# Patient Record
Sex: Female | Born: 1940 | Race: White | Hispanic: No | Smoking: Never smoker
Health system: Southern US, Community
[De-identification: ages and names within clinical notes are randomized; demographics above are authoritative.]

## PROBLEM LIST (undated history)

## (undated) DIAGNOSIS — E119 Type 2 diabetes mellitus without complications: Secondary | ICD-10-CM

## (undated) DIAGNOSIS — J45909 Unspecified asthma, uncomplicated: Secondary | ICD-10-CM

## (undated) DIAGNOSIS — E7800 Pure hypercholesterolemia, unspecified: Secondary | ICD-10-CM

## (undated) DIAGNOSIS — I10 Essential (primary) hypertension: Secondary | ICD-10-CM

## (undated) HISTORY — PX: OTHER SURGICAL HISTORY: SHX169

---

## 2004-04-07 ENCOUNTER — Inpatient Hospital Stay: Payer: Self-pay

## 2004-04-07 ENCOUNTER — Other Ambulatory Visit: Payer: Self-pay

## 2005-04-21 ENCOUNTER — Ambulatory Visit: Payer: Self-pay | Admitting: Family Medicine

## 2006-12-08 ENCOUNTER — Emergency Department: Payer: Self-pay | Admitting: Internal Medicine

## 2008-12-03 ENCOUNTER — Ambulatory Visit: Payer: Self-pay | Admitting: Internal Medicine

## 2009-07-25 ENCOUNTER — Ambulatory Visit: Payer: Self-pay | Admitting: Internal Medicine

## 2009-07-25 ENCOUNTER — Inpatient Hospital Stay: Payer: Self-pay | Admitting: Internal Medicine

## 2009-08-13 ENCOUNTER — Inpatient Hospital Stay: Payer: Self-pay | Admitting: Internal Medicine

## 2010-03-09 ENCOUNTER — Ambulatory Visit: Payer: Self-pay | Admitting: Orthopedic Surgery

## 2010-03-12 ENCOUNTER — Inpatient Hospital Stay: Payer: Self-pay | Admitting: Orthopedic Surgery

## 2010-03-17 LAB — PATHOLOGY REPORT

## 2010-03-24 ENCOUNTER — Ambulatory Visit: Payer: Self-pay | Admitting: Internal Medicine

## 2010-03-24 DIAGNOSIS — J449 Chronic obstructive pulmonary disease, unspecified: Secondary | ICD-10-CM

## 2010-03-24 DIAGNOSIS — I10 Essential (primary) hypertension: Secondary | ICD-10-CM

## 2010-03-24 DIAGNOSIS — E119 Type 2 diabetes mellitus without complications: Secondary | ICD-10-CM

## 2010-03-24 DIAGNOSIS — M130 Polyarthritis, unspecified: Secondary | ICD-10-CM

## 2010-04-15 DIAGNOSIS — L8990 Pressure ulcer of unspecified site, unspecified stage: Secondary | ICD-10-CM

## 2010-04-27 DIAGNOSIS — E119 Type 2 diabetes mellitus without complications: Secondary | ICD-10-CM

## 2010-04-27 DIAGNOSIS — M358 Other specified systemic involvement of connective tissue: Secondary | ICD-10-CM

## 2010-04-27 DIAGNOSIS — I10 Essential (primary) hypertension: Secondary | ICD-10-CM

## 2010-04-27 DIAGNOSIS — M3589 Other specified systemic involvement of connective tissue: Secondary | ICD-10-CM

## 2010-05-04 DIAGNOSIS — L8990 Pressure ulcer of unspecified site, unspecified stage: Secondary | ICD-10-CM

## 2010-05-07 DIAGNOSIS — L8990 Pressure ulcer of unspecified site, unspecified stage: Secondary | ICD-10-CM

## 2010-05-13 ENCOUNTER — Encounter: Payer: Self-pay | Admitting: Nurse Practitioner

## 2010-05-13 ENCOUNTER — Encounter: Payer: Self-pay | Admitting: Cardiothoracic Surgery

## 2010-05-28 ENCOUNTER — Inpatient Hospital Stay: Payer: Self-pay | Admitting: Surgery

## 2010-06-09 ENCOUNTER — Encounter: Payer: Self-pay | Admitting: Cardiothoracic Surgery

## 2010-06-09 ENCOUNTER — Encounter: Payer: Self-pay | Admitting: Nurse Practitioner

## 2010-07-10 ENCOUNTER — Encounter: Payer: Self-pay | Admitting: Cardiothoracic Surgery

## 2010-07-10 ENCOUNTER — Encounter: Payer: Self-pay | Admitting: Nurse Practitioner

## 2010-08-04 ENCOUNTER — Encounter: Payer: Self-pay | Admitting: Physician Assistant

## 2010-08-09 ENCOUNTER — Encounter: Payer: Self-pay | Admitting: Physician Assistant

## 2010-08-25 ENCOUNTER — Ambulatory Visit: Payer: Self-pay | Admitting: Internal Medicine

## 2010-09-09 ENCOUNTER — Encounter: Payer: Self-pay | Admitting: Physician Assistant

## 2010-10-10 ENCOUNTER — Encounter: Payer: Self-pay | Admitting: Physician Assistant

## 2010-11-09 ENCOUNTER — Encounter: Payer: Self-pay | Admitting: Physician Assistant

## 2011-05-18 ENCOUNTER — Encounter: Payer: Self-pay | Admitting: Cardiothoracic Surgery

## 2011-05-18 ENCOUNTER — Encounter: Payer: Self-pay | Admitting: Nurse Practitioner

## 2011-06-09 ENCOUNTER — Encounter: Payer: Self-pay | Admitting: Nurse Practitioner

## 2011-06-09 ENCOUNTER — Encounter: Payer: Self-pay | Admitting: Cardiothoracic Surgery

## 2015-07-14 ENCOUNTER — Emergency Department: Payer: Medicare Other

## 2015-07-14 ENCOUNTER — Encounter: Payer: Self-pay | Admitting: Emergency Medicine

## 2015-07-14 ENCOUNTER — Inpatient Hospital Stay
Admission: EM | Admit: 2015-07-14 | Discharge: 2015-07-18 | DRG: 190 | Disposition: A | Discharge status: Skilled Nursing Facility | Payer: Medicare Other | Attending: Internal Medicine | Admitting: Internal Medicine

## 2015-07-14 DIAGNOSIS — N179 Acute kidney failure, unspecified: Secondary | ICD-10-CM | POA: Diagnosis present

## 2015-07-14 DIAGNOSIS — E1122 Type 2 diabetes mellitus with diabetic chronic kidney disease: Secondary | ICD-10-CM | POA: Diagnosis present

## 2015-07-14 DIAGNOSIS — R1319 Other dysphagia: Secondary | ICD-10-CM | POA: Diagnosis present

## 2015-07-14 DIAGNOSIS — J189 Pneumonia, unspecified organism: Secondary | ICD-10-CM | POA: Diagnosis present

## 2015-07-14 DIAGNOSIS — D638 Anemia in other chronic diseases classified elsewhere: Secondary | ICD-10-CM | POA: Diagnosis present

## 2015-07-14 DIAGNOSIS — E7800 Pure hypercholesterolemia, unspecified: Secondary | ICD-10-CM | POA: Diagnosis present

## 2015-07-14 DIAGNOSIS — L8990 Pressure ulcer of unspecified site, unspecified stage: Secondary | ICD-10-CM | POA: Diagnosis present

## 2015-07-14 DIAGNOSIS — Z96641 Presence of right artificial hip joint: Secondary | ICD-10-CM | POA: Diagnosis present

## 2015-07-14 DIAGNOSIS — J9621 Acute and chronic respiratory failure with hypoxia: Secondary | ICD-10-CM

## 2015-07-14 DIAGNOSIS — N183 Chronic kidney disease, stage 3 (moderate): Secondary | ICD-10-CM | POA: Diagnosis present

## 2015-07-14 DIAGNOSIS — R0603 Acute respiratory distress: Secondary | ICD-10-CM

## 2015-07-14 DIAGNOSIS — J9691 Respiratory failure, unspecified with hypoxia: Secondary | ICD-10-CM | POA: Diagnosis present

## 2015-07-14 DIAGNOSIS — J440 Chronic obstructive pulmonary disease with acute lower respiratory infection: Principal | ICD-10-CM | POA: Diagnosis present

## 2015-07-14 DIAGNOSIS — J441 Chronic obstructive pulmonary disease with (acute) exacerbation: Secondary | ICD-10-CM | POA: Diagnosis not present

## 2015-07-14 DIAGNOSIS — Z79899 Other long term (current) drug therapy: Secondary | ICD-10-CM

## 2015-07-14 DIAGNOSIS — J90 Pleural effusion, not elsewhere classified: Secondary | ICD-10-CM | POA: Diagnosis present

## 2015-07-14 DIAGNOSIS — E1110 Type 2 diabetes mellitus with ketoacidosis without coma: Secondary | ICD-10-CM

## 2015-07-14 DIAGNOSIS — J4520 Mild intermittent asthma, uncomplicated: Secondary | ICD-10-CM | POA: Diagnosis present

## 2015-07-14 DIAGNOSIS — Z7982 Long term (current) use of aspirin: Secondary | ICD-10-CM | POA: Diagnosis not present

## 2015-07-14 DIAGNOSIS — E785 Hyperlipidemia, unspecified: Secondary | ICD-10-CM | POA: Diagnosis present

## 2015-07-14 DIAGNOSIS — Z823 Family history of stroke: Secondary | ICD-10-CM

## 2015-07-14 DIAGNOSIS — Z7951 Long term (current) use of inhaled steroids: Secondary | ICD-10-CM

## 2015-07-14 DIAGNOSIS — Z825 Family history of asthma and other chronic lower respiratory diseases: Secondary | ICD-10-CM | POA: Diagnosis not present

## 2015-07-14 DIAGNOSIS — I129 Hypertensive chronic kidney disease with stage 1 through stage 4 chronic kidney disease, or unspecified chronic kidney disease: Secondary | ICD-10-CM | POA: Diagnosis present

## 2015-07-14 DIAGNOSIS — Z7984 Long term (current) use of oral hypoglycemic drugs: Secondary | ICD-10-CM

## 2015-07-14 DIAGNOSIS — J4530 Mild persistent asthma, uncomplicated: Secondary | ICD-10-CM | POA: Diagnosis not present

## 2015-07-14 DIAGNOSIS — R05 Cough: Secondary | ICD-10-CM | POA: Diagnosis not present

## 2015-07-14 DIAGNOSIS — J9601 Acute respiratory failure with hypoxia: Secondary | ICD-10-CM | POA: Diagnosis present

## 2015-07-14 DIAGNOSIS — J4521 Mild intermittent asthma with (acute) exacerbation: Secondary | ICD-10-CM

## 2015-07-14 DIAGNOSIS — R0902 Hypoxemia: Secondary | ICD-10-CM

## 2015-07-14 DIAGNOSIS — R0600 Dyspnea, unspecified: Secondary | ICD-10-CM | POA: Diagnosis not present

## 2015-07-14 HISTORY — DX: Essential (primary) hypertension: I10

## 2015-07-14 HISTORY — DX: Type 2 diabetes mellitus without complications: E11.9

## 2015-07-14 HISTORY — DX: Pure hypercholesterolemia, unspecified: E78.00

## 2015-07-14 HISTORY — DX: Unspecified asthma, uncomplicated: J45.909

## 2015-07-14 LAB — COMPREHENSIVE METABOLIC PANEL
ALBUMIN: 3 g/dL — AB (ref 3.5–5.0)
ALK PHOS: 123 U/L (ref 38–126)
ALT: 17 U/L (ref 14–54)
ANION GAP: 10 (ref 5–15)
AST: 15 U/L (ref 15–41)
BILIRUBIN TOTAL: 0.6 mg/dL (ref 0.3–1.2)
BUN: 24 mg/dL — ABNORMAL HIGH (ref 6–20)
CALCIUM: 8.8 mg/dL — AB (ref 8.9–10.3)
CO2: 24 mmol/L (ref 22–32)
CREATININE: 1.38 mg/dL — AB (ref 0.44–1.00)
Chloride: 102 mmol/L (ref 101–111)
GFR calc non Af Amer: 36 mL/min — ABNORMAL LOW (ref >60)
GFR, EST AFRICAN AMERICAN: 42 mL/min — AB (ref >60)
GLUCOSE: 242 mg/dL — AB (ref 65–99)
Potassium: 4.1 mmol/L (ref 3.5–5.1)
Sodium: 136 mmol/L (ref 135–145)
TOTAL PROTEIN: 7.1 g/dL (ref 6.5–8.1)

## 2015-07-14 LAB — CBC WITH DIFFERENTIAL/PLATELET
Basophils Absolute: 0.1 10*3/uL (ref 0–0.1)
Basophils Relative: 1 %
Eosinophils Absolute: 0 10*3/uL (ref 0–0.7)
Eosinophils Relative: 0 %
HEMATOCRIT: 32.2 % — AB (ref 35.0–47.0)
HEMOGLOBIN: 10.6 g/dL — AB (ref 12.0–16.0)
LYMPHS ABS: 1.3 10*3/uL (ref 1.0–3.6)
Lymphocytes Relative: 10 %
MCH: 29.3 pg (ref 26.0–34.0)
MCHC: 32.9 g/dL (ref 32.0–36.0)
MCV: 89.1 fL (ref 80.0–100.0)
Monocytes Absolute: 1 10*3/uL — ABNORMAL HIGH (ref 0.2–0.9)
NEUTROS ABS: 11.4 10*3/uL — AB (ref 1.4–6.5)
Platelets: 361 10*3/uL (ref 150–440)
RBC: 3.62 MIL/uL — AB (ref 3.80–5.20)
RDW: 15 % — ABNORMAL HIGH (ref 11.5–14.5)
WBC: 13.9 10*3/uL — AB (ref 3.6–11.0)

## 2015-07-14 LAB — GLUCOSE, CAPILLARY
Glucose-Capillary: 271 mg/dL — ABNORMAL HIGH (ref 65–99)
Glucose-Capillary: 298 mg/dL — ABNORMAL HIGH (ref 65–99)
Glucose-Capillary: 371 mg/dL — ABNORMAL HIGH (ref 65–99)
Glucose-Capillary: 337 mg/dL — ABNORMAL HIGH (ref 65–99)
Glucose-Capillary: 362 mg/dL — ABNORMAL HIGH (ref 65–99)

## 2015-07-14 LAB — BRAIN NATRIURETIC PEPTIDE: B Natriuretic Peptide: 63 pg/mL (ref 0.0–100.0)

## 2015-07-14 LAB — TSH: TSH: 0.865 u[IU]/mL (ref 0.350–4.500)

## 2015-07-14 LAB — TROPONIN I: Troponin I: <0.03 ng/mL (ref <0.031)

## 2015-07-14 LAB — MRSA PCR SCREENING: MRSA BY PCR: NEGATIVE

## 2015-07-14 LAB — HEMOGLOBIN A1C: Hgb A1c MFr Bld: 7.8 % — ABNORMAL HIGH (ref 4.0–6.0)

## 2015-07-14 MED ORDER — DOCUSATE SODIUM 100 MG PO CAPS
100.0000 mg | ORAL_CAPSULE | Freq: Two times a day (BID) | ORAL | Status: DC
  Administered 2015-07-14 – 2015-07-18 (×8): 100 mg via ORAL
  Filled 2015-07-14 (×9): qty 1

## 2015-07-14 MED ORDER — BENAZEPRIL HCL 20 MG PO TABS
20.0000 mg | ORAL_TABLET | Freq: Every day | ORAL | Status: DC
  Administered 2015-07-14 – 2015-07-17 (×4): 20 mg via ORAL
  Filled 2015-07-14 (×4): qty 1

## 2015-07-14 MED ORDER — VITAMIN B-12 1000 MCG PO TABS
1000.0000 ug | ORAL_TABLET | Freq: Every day | ORAL | Status: DC
  Administered 2015-07-14 – 2015-07-18 (×5): 1000 ug via ORAL
  Filled 2015-07-14 (×6): qty 1

## 2015-07-14 MED ORDER — SODIUM CHLORIDE 0.9 % IV SOLN
INTRAVENOUS | Status: DC
  Administered 2015-07-14: 07:00:00 via INTRAVENOUS

## 2015-07-14 MED ORDER — SODIUM CHLORIDE 0.9 % IV BOLUS (SEPSIS)
1000.0000 mL | Freq: Once | INTRAVENOUS | Status: AC
  Administered 2015-07-14: 1000 mL via INTRAVENOUS

## 2015-07-14 MED ORDER — SIMVASTATIN 20 MG PO TABS
20.0000 mg | ORAL_TABLET | Freq: Every day | ORAL | Status: DC
  Administered 2015-07-15 – 2015-07-17 (×3): 20 mg via ORAL
  Filled 2015-07-14 (×4): qty 1

## 2015-07-14 MED ORDER — AMLODIPINE BESYLATE 10 MG PO TABS
10.0000 mg | ORAL_TABLET | Freq: Every day | ORAL | Status: DC
  Administered 2015-07-14 – 2015-07-17 (×4): 10 mg via ORAL
  Filled 2015-07-14 (×4): qty 1

## 2015-07-14 MED ORDER — LEVOFLOXACIN IN D5W 750 MG/150ML IV SOLN
750.0000 mg | INTRAVENOUS | Status: DC
  Filled 2015-07-14: qty 150

## 2015-07-14 MED ORDER — CALCIUM CARBONATE-VITAMIN D 500-200 MG-UNIT PO TABS
1.0000 | ORAL_TABLET | Freq: Every day | ORAL | Status: DC
  Administered 2015-07-15 – 2015-07-17 (×3): 1 via ORAL
  Filled 2015-07-14 (×4): qty 1

## 2015-07-14 MED ORDER — METHYLPREDNISOLONE SODIUM SUCC 125 MG IJ SOLR
125.0000 mg | Freq: Once | INTRAMUSCULAR | Status: AC
  Administered 2015-07-14: 125 mg via INTRAVENOUS
  Filled 2015-07-14: qty 2

## 2015-07-14 MED ORDER — HYDROCHLOROTHIAZIDE 12.5 MG PO CAPS
12.5000 mg | ORAL_CAPSULE | Freq: Every day | ORAL | Status: DC
  Administered 2015-07-14 – 2015-07-17 (×4): 12.5 mg via ORAL
  Filled 2015-07-14 (×4): qty 1

## 2015-07-14 MED ORDER — ONDANSETRON HCL 4 MG PO TABS: 4.0000 mg | ORAL_TABLET | Freq: Four times a day (QID) | ORAL | Status: DC | PRN

## 2015-07-14 MED ORDER — INSULIN ASPART 100 UNIT/ML ~~LOC~~ SOLN
0.0000 [IU] | Freq: Three times a day (TID) | SUBCUTANEOUS | Status: DC
  Administered 2015-07-14 (×2): 7 [IU] via SUBCUTANEOUS
  Administered 2015-07-14: 5 [IU] via SUBCUTANEOUS
  Filled 2015-07-14: qty 8
  Filled 2015-07-14: qty 7
  Filled 2015-07-14: qty 5

## 2015-07-14 MED ORDER — HEPARIN SODIUM (PORCINE) 5000 UNIT/ML IJ SOLN
5000.0000 [IU] | Freq: Three times a day (TID) | INTRAMUSCULAR | Status: DC
  Administered 2015-07-14 – 2015-07-16 (×7): 5000 [IU] via SUBCUTANEOUS
  Filled 2015-07-14 (×7): qty 1

## 2015-07-14 MED ORDER — ACETAMINOPHEN 325 MG PO TABS: 650.0000 mg | ORAL_TABLET | Freq: Four times a day (QID) | ORAL | Status: DC | PRN

## 2015-07-14 MED ORDER — PREDNISONE 20 MG PO TABS
30.0000 mg | ORAL_TABLET | Freq: Every day | ORAL | Status: AC
  Filled 2015-07-14 (×2): qty 1

## 2015-07-14 MED ORDER — LEVOFLOXACIN IN D5W 750 MG/150ML IV SOLN
750.0000 mg | Freq: Once | INTRAVENOUS | Status: AC
  Administered 2015-07-14: 750 mg via INTRAVENOUS
  Filled 2015-07-14: qty 150

## 2015-07-14 MED ORDER — PREDNISONE 20 MG PO TABS
40.0000 mg | ORAL_TABLET | Freq: Every day | ORAL | Status: AC
  Administered 2015-07-15: 40 mg via ORAL
  Filled 2015-07-14: qty 2

## 2015-07-14 MED ORDER — PROMETHAZINE HCL 25 MG/ML IJ SOLN
12.5000 mg | Freq: Four times a day (QID) | INTRAMUSCULAR | Status: DC | PRN
  Administered 2015-07-14: 12.5 mg via INTRAVENOUS
  Filled 2015-07-14 (×2): qty 1

## 2015-07-14 MED ORDER — ADULT MULTIVITAMIN W/MINERALS CH
1.0000 | ORAL_TABLET | Freq: Every day | ORAL | Status: DC
  Administered 2015-07-14 – 2015-07-18 (×5): 1 via ORAL
  Filled 2015-07-14 (×6): qty 1

## 2015-07-14 MED ORDER — INSULIN ASPART 100 UNIT/ML ~~LOC~~ SOLN
0.0000 [IU] | Freq: Every day | SUBCUTANEOUS | Status: DC
  Administered 2015-07-14: 5 [IU] via SUBCUTANEOUS
  Filled 2015-07-14: qty 5

## 2015-07-14 MED ORDER — PREDNISONE 10 MG PO TABS
10.0000 mg | ORAL_TABLET | Freq: Every day | ORAL | Status: DC
  Administered 2015-07-16: 10 mg via ORAL
  Administered 2015-07-17: 20 mg via ORAL
  Administered 2015-07-18: 10 mg via ORAL
  Filled 2015-07-14: qty 1

## 2015-07-14 MED ORDER — PREDNISONE 20 MG PO TABS
20.0000 mg | ORAL_TABLET | Freq: Every day | ORAL | Status: AC
  Administered 2015-07-16: 20 mg via ORAL

## 2015-07-14 MED ORDER — MORPHINE SULFATE (PF) 2 MG/ML IV SOLN: 1.0000 mg | INTRAVENOUS | Status: DC | PRN

## 2015-07-14 MED ORDER — IPRATROPIUM-ALBUTEROL 0.5-2.5 (3) MG/3ML IN SOLN
3.0000 mL | RESPIRATORY_TRACT | Status: DC
  Administered 2015-07-14 – 2015-07-17 (×20): 3 mL via RESPIRATORY_TRACT
  Filled 2015-07-14 (×21): qty 3

## 2015-07-14 MED ORDER — IPRATROPIUM-ALBUTEROL 0.5-2.5 (3) MG/3ML IN SOLN
3.0000 mL | Freq: Once | RESPIRATORY_TRACT | Status: AC
  Administered 2015-07-14: 3 mL via RESPIRATORY_TRACT
  Filled 2015-07-14: qty 3

## 2015-07-14 MED ORDER — MOMETASONE FURO-FORMOTEROL FUM 200-5 MCG/ACT IN AERO
2.0000 | INHALATION_SPRAY | Freq: Two times a day (BID) | RESPIRATORY_TRACT | Status: DC
  Administered 2015-07-14 – 2015-07-16 (×5): 2 via RESPIRATORY_TRACT
  Filled 2015-07-14: qty 8.8

## 2015-07-14 MED ORDER — SODIUM CHLORIDE 0.9% FLUSH
3.0000 mL | Freq: Two times a day (BID) | INTRAVENOUS | Status: DC
  Administered 2015-07-14 – 2015-07-18 (×9): 3 mL via INTRAVENOUS

## 2015-07-14 MED ORDER — DEXTROSE 5 % IV SOLN: 500.0000 mg | Freq: Once | INTRAVENOUS | Status: DC

## 2015-07-14 MED ORDER — ASPIRIN EC 81 MG PO TBEC
81.0000 mg | DELAYED_RELEASE_TABLET | Freq: Every day | ORAL | Status: DC
Start: 2015-07-14 — End: 2015-07-18
  Administered 2015-07-14 – 2015-07-18 (×5): 81 mg via ORAL
  Filled 2015-07-14 (×5): qty 1

## 2015-07-14 MED ORDER — ONDANSETRON HCL 4 MG/2ML IJ SOLN
4.0000 mg | Freq: Four times a day (QID) | INTRAMUSCULAR | Status: DC | PRN
  Administered 2015-07-14 – 2015-07-15 (×3): 4 mg via INTRAVENOUS
  Filled 2015-07-14 (×3): qty 2

## 2015-07-14 MED ORDER — GUAIFENESIN-CODEINE 100-10 MG/5ML PO SOLN: 5.0000 mL | Freq: Every evening | ORAL | Status: DC | PRN

## 2015-07-14 MED ORDER — PREDNISONE 20 MG PO TABS
50.0000 mg | ORAL_TABLET | Freq: Every day | ORAL | Status: AC
  Administered 2015-07-14: 50 mg via ORAL
  Filled 2015-07-14: qty 2

## 2015-07-14 MED ORDER — ALBUTEROL SULFATE HFA 108 (90 BASE) MCG/ACT IN AERS: 2.0000 | INHALATION_SPRAY | Freq: Four times a day (QID) | RESPIRATORY_TRACT | Status: DC | PRN

## 2015-07-14 MED ORDER — ACETAMINOPHEN 650 MG RE SUPP: 650.0000 mg | Freq: Four times a day (QID) | RECTAL | Status: DC | PRN

## 2015-07-14 MED ORDER — DEXTROSE 5 % IV SOLN: 1.0000 g | INTRAVENOUS | Status: DC

## 2015-07-14 NOTE — ED Notes (Signed)
Pt reports improved breathing. resp rate improving, pt no longer markedly dyspnic with exertion.

## 2015-07-14 NOTE — Consult Note (Signed)
PULMONARY / CRITICAL CARE MEDICINE   Name: Brandi Hoover MRN: [id number] DOB: 01/02/1941    ADMISSION DATE:  07/14/2015 CONSULTATION DATE: 07/14/15  REFERRING MD: Boykin Reaper  CHIEF COMPLAINT:  Shortness of breath.  HISTORY OF PRESENT ILLNESS:   Brandi, Hoover is a 75 year old female with past medical history significant history of Diabetese melitus, Hypertension, Hyperlipidemia ,COPD. Patient presented to EMS on 07/14/15 via EMS with severe shortness of breath, she received some treatments with bronchodilators, but continued to be hypoxic.  She received some intravenous steroids and levofloxacin.  Patient was started on a non -rebreather mask.  CXR was concerning for  Small right pleural effusion versus infiltrate. PCCM team was consulted on 6/5 for further management.  PAST MEDICAL HISTORY :  She  has a past medical history of Diabetes mellitus without complication ([place]); Hypertension; High cholesterol; and Asthma.  PAST SURGICAL HISTORY: She  has past surgical history that includes right hip replacement.  Allergies  Allergen Reactions  . Azithromycin Itching  . Gabapentin Other (See Comments)    Bad dreams, insomnia  . Omeprazole Other (See Comments)    Reaction: unknown  . Ramipril Other (See Comments)    Reaction: unknown    No current facility-administered medications on file prior to encounter.   No current outpatient prescriptions on file prior to encounter.    FAMILY HISTORY:  Her has no family status information on file.   SOCIAL HISTORY: She  reports that she has never smoked. She has never used smokeless tobacco. She reports that she does not drink alcohol or use illicit drugs.  REVIEW OF SYSTEMS:   Review of Systems  Constitutional: Negative for fever and chills.  HENT: Negative for ear discharge, ear pain, hearing loss and tinnitus.   Eyes: Negative for blurred vision, double vision, photophobia, pain, discharge and redness.  Respiratory: Positive for cough, sputum  production, shortness of breath and wheezing. Negative for hemoptysis.   Cardiovascular: Negative for palpitations and orthopnea.  Gastrointestinal: Negative for nausea and vomiting.  Genitourinary: Negative for urgency and frequency.  Musculoskeletal: Negative for back pain and neck pain.  Neurological: Negative for speech change, focal weakness and headaches.  Psychiatric/Behavioral: Negative for hallucinations. The patient is not nervous/anxious.      SUBJECTIVE:   Patient states that the patient came with shortness of breath, was on BiPAP and now weaned to nasal canula, patient states that she has been feeling better, but with activities she will get short of breath.  VITAL SIGNS: BP 143/67 mmHg  Pulse 90  Temp(Src) 98.3 F (36.8 C) (Oral)  Resp 20  Ht 5\' 6"  (1.676 m)  Wt 54.885 kg (121 lb)  BMI 19.54 kg/m2  SpO2 94%  HEMODYNAMICS:    VENTILATOR SETTINGS:    INTAKE / OUTPUT: I/O last 3 completed shifts: In: -  Out: 80 [Urine:80]  PHYSICAL EXAMINATION: General:  Elderly, white female, in no acute distress. Neuro:  Awake, alert, oriented, follows command, no focal deficits HEENT:  Atraumatic, normocephalic, no discharge Cardiovascular:  S1S2, rrr, no MRG noted Lungs:  Diminished bilaterally, no wheezes, crackles, rhonchi noted. Abdomen: obese, soft, non tender Musculoskeletal:  RLE trace edema, no inflammation/deformity noted Skin: Grossly intact  LABS:  BMET  Recent Labs Lab 07/14/15 0057  NA 136  K 4.1  CL 102  CO2 24  BUN 24*  CREATININE 1.38*  GLUCOSE 242*    Electrolytes  Recent Labs Lab 07/14/15 0057  CALCIUM 8.8*    CBC  Recent  Labs Lab 07/14/15 0057  WBC 13.9*  HGB 10.6*  HCT 32.2*  PLT 361    Coag's No results for input(s): APTT, INR in the last 168 hours.  Sepsis Markers No results for input(s): LATICACIDVEN, PROCALCITON, O2SATVEN in the last 168 hours.  ABG No results for input(s): PHART, PCO2ART, PO2ART in the last  168 hours.  Liver Enzymes  Recent Labs Lab 07/14/15 0057  AST 15  ALT 17  ALKPHOS 123  BILITOT 0.6  ALBUMIN 3.0*    Cardiac Enzymes  Recent Labs Lab 07/14/15 0057  TROPONINI <0.03    Glucose  Recent Labs Lab 07/14/15 0424 07/14/15 0727  GLUCAP 271* 298*    Imaging Dg Chest Port 1 View  07/14/2015  CLINICAL DATA:  75 year old female with cough and congestion and shortness of breath EXAM: PORTABLE CHEST 1 VIEW COMPARISON:  None. FINDINGS: Single-view of the chest demonstrates a small right pleural effusion with right lung base atelectasis versus infiltrate. The left lung is clear. There is no pneumothorax. Top-normal cardiac silhouette. Osteopenia with degenerative changes of the spine and shoulders. No acute fracture. IMPRESSION: Small right pleural effusion and right lung base atelectasis versus infiltrate. Clinical correlation and follow-up recommended. Electronically Signed   By: Anner Crete M.D.   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 -Pleural effusion is small and does not require any intervention at this time will watch CARDIOVASCULAR A:  History of  hypertension History of hyperlipidemia P:  -Continuous telemetry  -Continue amlodipine -Continue statin therapy  RENAL A:   Acute kidney injury P:   -Monitor intake output   -follow-up chemistry -Replace electrolytes per ICU protocol  GASTROINTESTINAL A:   No active issues P  Heart healthy diet with modified carb  HEMATOLOGICAL A:   No active issues P:   Transfuse if Hgb <7   heparin for DVT prophylaxis  INFECTIOUS A:   Leukocytosis possibly due to steroids P:   Follow culture Monitor fever curve Continue antibiotics  ENDOCRINE A:    Diabetes mellitus P:   -Follow blood sugars before meals and bedtime's  -Sliding scale insulin coverage   NEUROLOGIC A:   No active issues P:   RASS goal:0     Lashunta Frieden,AG-ACNP Pulmonary & [place]  07/14/2015, 10:17 AM

## 2015-07-14 NOTE — Progress Notes (Signed)
[place] [facility] [place] NAME: Keyla Goodlin    MR#:  [id number]  DATE OF BIRTH:  [birth year]  SUBJECTIVE:  CHIEF COMPLAINT:   Chief Complaint  Patient presents with  . Respiratory Distress   Continues to have shortness of breath and weakness. Does not wear home oxygen. Never smoked. Good appetite. Was on a nonrebreather and now on 5 L oxygen.   REVIEW OF SYSTEMS:    Review of Systems  Constitutional: Positive for malaise/fatigue. Negative for fever and chills.  HENT: Negative for sore throat.   Eyes: Negative for blurred vision, double vision and pain.  Respiratory: Positive for cough, sputum production and shortness of breath. Negative for hemoptysis and wheezing.   Cardiovascular: Negative for chest pain, palpitations, orthopnea and leg swelling.  Gastrointestinal: Negative for heartburn, nausea, vomiting, abdominal pain, diarrhea and constipation.  Genitourinary: Negative for dysuria and hematuria.  Musculoskeletal: Positive for myalgias. Negative for back pain and joint pain.  Skin: Negative for rash.  Neurological: Positive for weakness. Negative for sensory change, speech change, focal weakness and headaches.  Endo/Heme/Allergies: Does not bruise/bleed easily.  Psychiatric/Behavioral: Negative for depression. The patient is not nervous/anxious.     DRUG ALLERGIES:   Allergies  Allergen Reactions  . Azithromycin Itching  . Gabapentin Other (See Comments)    Bad dreams, insomnia  . Omeprazole Other (See Comments)    Reaction: unknown  . Ramipril Other (See Comments)    Reaction: unknown    VITALS:  Blood pressure 147/127, pulse 99, temperature 98.7 F (37.1 C), temperature source Oral, resp. rate 29, height 5\' 6"  (1.676 m), weight 54.885 kg (121 lb), SpO2 95 %.  PHYSICAL EXAMINATION:   Physical Exam  GENERAL:  75 y.o.-year-old patient lying in the bed with no acute distress. Obese EYES: Pupils equal, round,  reactive to light and accommodation. No scleral icterus. Extraocular muscles intact.  HEENT: Head atraumatic, normocephalic. Oropharynx and nasopharynx clear.  NECK:  Supple, no jugular venous distention. No thyroid enlargement, no tenderness.  LUNGS: Increased work of breathing. Right lower lobe decreased air entry and rhonchi. CARDIOVASCULAR: S1, S2 normal. No murmurs, rubs, or gallops.  ABDOMEN: Soft, nontender, nondistended. Bowel sounds present. No organomegaly or mass.  EXTREMITIES: No cyanosis, clubbing or edema b/l.    NEUROLOGIC: Cranial nerves II through XII are intact. No focal Motor or sensory deficits b/l.   PSYCHIATRIC: The patient is alert and oriented x 3.  SKIN: No obvious rash, lesion, or ulcer.   LABORATORY PANEL:   CBC  Recent Labs Lab 07/14/15 0057  WBC 13.9*  HGB 10.6*  HCT 32.2*  PLT 361   ------------------------------------------------------------------------------------------------------------------ Chemistries   Recent Labs Lab 07/14/15 0057  NA 136  K 4.1  CL 102  CO2 24  GLUCOSE 242*  BUN 24*  CREATININE 1.38*  CALCIUM 8.8*  AST 15  ALT 17  ALKPHOS 123  BILITOT 0.6   ------------------------------------------------------------------------------------------------------------------  Cardiac Enzymes  Recent Labs Lab 07/14/15 0057  TROPONINI <0.03   ------------------------------------------------------------------------------------------------------------------  RADIOLOGY:  Dg Chest Port 1 View  07/14/2015  CLINICAL DATA:  75 year old female with cough and congestion and shortness of breath EXAM: PORTABLE CHEST 1 VIEW COMPARISON:  None. FINDINGS: Single-view of the chest demonstrates a small right pleural effusion with right lung base atelectasis versus infiltrate. The left lung is clear. There is no pneumothorax. Top-normal cardiac silhouette. Osteopenia with degenerative changes of the spine and shoulders. No acute fracture.  IMPRESSION: Small right pleural effusion  and right lung base atelectasis versus infiltrate. Clinical correlation and follow-up recommended. Electronically Signed   By: Anner Crete M.D.   On: 07/14/2015 01:32     ASSESSMENT AND PLAN:   * Acute right lower lobe pneumonia with acute hypoxic respiratory failure On IV antibiotics. Nebulizers when necessary. Wean oxygen as tolerated Sputum culture. Appreciate pulmonary input. Discussed with Dr. Stevenson Clinch.  * Hypertension Continue home medications.  * Diabetes mellitus Home medications plus sliding scale insulin.  * CKD 3  * Anemia of chronic disease  * DVT prophylaxis with subcutaneous heparin  All the records are reviewed and case discussed with Care Management/Social Workerr. Management plans discussed with the patient, family and they are in agreement.  CODE STATUS: FULL CODE  DVT Prophylaxis: SCDs  TOTAL TIME TAKING CARE OF THIS PATIENT: 35 minutes.   POSSIBLE D/C IN 2-3 DAYS, DEPENDING ON CLINICAL CONDITION.  Hillary Bow R M.D on 07/14/2015 at 10:52 AM  Between 7am to 6pm - Pager - [phone]  After 6pm go to [email or web address] - password [email or web address] [facility]  [facility]  Office  [phone]  CC: Primary care physician; No primary care provider on file.  Note: This dictation was prepared with Dragon dictation along with smaller phrase technology. Any transcriptional errors that result from this process are unintentional.

## 2015-07-14 NOTE — Care Management Note (Signed)
Case Management Note  Patient Details  Name: ANALIESE KRUPKA MRN: [id number] Date of Birth: [birth year]  Subjective/Objective:                  Patient states She is dependent on a rollator for ambulation. She states she still drives. She has two brothers that are available and local to assist too. She uses a wheelchair for extended mobility such as shopping. Her PCP is Dr. Adrian Prows. O2 is acute. She uses [facility] for Rx.   Action/Plan: Met with patient to discuss discharge planning. Patient states she has a daughter but she lives in [place]. She states that her daughter is willing to assist her with groceries if needed. Per staff patient's daughter is not very healthy either.   List of home health agencies left with patient. Requested PT for patient.   Expected Discharge Date:                  Expected Discharge Plan:     In-House Referral:     Discharge planning Services  CM Consult  Post Acute Care Choice:  Home Health Choice offered to:  Patient  DME Arranged:    DME Agency:     HH Arranged:    [place] Agency:     Status of Service:  In process, will continue to follow  Medicare Important Message Given:    Date Medicare IM Given:    Medicare IM give by:    Date Additional Medicare IM Given:    Additional Medicare Important Message give by:     If discussed at [place] of Stay Meetings, dates discussed:    Additional Comments:  Marshell Garfinkel, RN 07/14/2015, 11:39 AM

## 2015-07-14 NOTE — Progress Notes (Signed)
[place] Progress Note Patient Name: LIV FOOS DOB: 03/21/1940 MRN: [id number]   Date of Service  07/14/2015  HPI/Events of Note  N/V - Zofran not effective.   eICU Interventions  Will order Phenergan 12.5 mg IV Q 6 hours PRN.     Intervention Category Intermediate Interventions: Other:  Lysle Dingwall 07/14/2015, 9:46 PM

## 2015-07-14 NOTE — ED Notes (Signed)
Pt states one week of cough and congestion. Pt with shob increasing last pm. Pt arrives on 15lpm non rebreather with pox of 90%. Pt received albuterol in ems. Rales auscultated. Pt with tachypnea and dyspnea with minimal exertion.

## 2015-07-14 NOTE — ED Provider Notes (Signed)
[facility] Emergency Department Provider Note   ____________________________________________  Time seen: Approximately 12:58 AM  I have reviewed the triage vital signs and the nursing notes.   HISTORY  Chief Complaint Respiratory Distress    HPI Brandi Hoover is a 75 y.o. female who presents to the ED from home via EMS with a chief complaint of shortness of breath. Patient has a history of asthma, does not wear oxygen at home. Complains of a one-week history of cough productive of gray sputum, congestion with increasing shortness of breath last p.m. Saw her PCP a few days ago who placed patient on Mucinex and pro-air. Patient arrives on 15 L nonrebreather with pulse ox 90%. Received albuterol nebulizer en route per EMS which partially helped her shortness of breath. Denies associated fever, chills, chest pain, abdominal pain, nausea, vomiting, diarrhea. Denies recent travel or trauma.   Past Medical History  Diagnosis Date  . Diabetes mellitus without complication ([place])   . Hypertension   . High cholesterol   . Asthma     There are no active problems to display for this patient.   Past Surgical History  Procedure Laterality Date  . Right hip replacement      Current Outpatient Rx  Name  Route  Sig  Dispense  Refill  . albuterol (VENTOLIN HFA) 108 (90 Base) MCG/ACT inhaler   Inhalation   Inhale 2 puffs into the lungs every 6 (six) hours as needed.         Marland Kitchen amLODipine (NORVASC) 10 MG tablet   Oral   Take 10 mg by mouth daily.         Marland Kitchen aspirin EC 81 MG tablet   Oral   Take 81 mg by mouth daily.         . benazepril (LOTENSIN) 20 MG tablet   Oral   Take 20 mg by mouth daily.         . Fluticasone-Salmeterol (ADVAIR) 250-50 MCG/DOSE AEPB   Inhalation   Inhale 1 puff into the lungs 2 (two) times daily.         Marland Kitchen glipiZIDE (GLUCOTROL) 10 MG tablet   Oral   Take 10 mg by mouth 2 (two) times daily before a meal.         .  hydrochlorothiazide (MICROZIDE) 12.5 MG capsule   Oral   Take 12.5 mg by mouth daily.         . metFORMIN (GLUCOPHAGE) 500 MG tablet   Oral   Take 1,000-1,500 mg by mouth 2 (two) times daily with a meal. 1500 mg in the morning and 1000 mg in the evening         . Multiple Vitamin (MULTIVITAMIN WITH MINERALS) TABS tablet   Oral   Take 1 tablet by mouth daily.         . simvastatin (ZOCOR) 20 MG tablet   Oral   Take 20 mg by mouth at bedtime.         . vitamin B-12 (CYANOCOBALAMIN) 1000 MCG tablet   Oral   Take 1,000 mcg by mouth daily.         . Calcium Carbonate-Vitamin D 600-400 MG-UNIT tablet   Oral   Take 1 tablet by mouth at bedtime.         Marland Kitchen guaiFENesin-codeine 100-10 MG/5ML syrup   Oral   Take 5 mLs by mouth at bedtime as needed.      0     Allergies  Review of patient's allergies indicates not on file.  No family history on file.  Social History Social History  Substance Use Topics  . Smoking status: Never Smoker   . Smokeless tobacco: Never Used  . Alcohol Use: No    Review of Systems  Constitutional: No fever/chills. Eyes: No visual changes. ENT: No sore throat. Cardiovascular: Denies chest pain. Respiratory: Positive for cough and shortness of breath. Gastrointestinal: No abdominal pain.  No nausea, no vomiting.  No diarrhea.  No constipation. Genitourinary: Negative for dysuria. Musculoskeletal: Negative for back pain. Skin: Negative for rash. Neurological: Negative for headaches, focal weakness or numbness.  10-point ROS otherwise negative.  ____________________________________________   PHYSICAL EXAM:  VITAL SIGNS: ED Triage Vitals  Enc Vitals Group     BP 07/14/15 0056 155/98 mmHg     Pulse Rate 07/14/15 0056 113     Resp 07/14/15 0056 30     Temp 07/14/15 0056 99 F (37.2 C)     Temp Source 07/14/15 0056 Oral     SpO2 07/14/15 0056 90 %     Weight 07/14/15 0056 121 lb (54.885 kg)     Height 07/14/15 0056 5\' 6"   (1.676 m)     Head Cir --      Peak Flow --      Pain Score --      Pain Loc --      Pain Edu? --      Excl. in [place]? --     Constitutional: Alert and oriented. Well appearing and in moderate acute distress. Eyes: Conjunctivae are normal. PERRL. EOMI. Head: Atraumatic. Nose: Congestion/rhinnorhea. Mouth/Throat: Mucous membranes are moist.  Oropharynx non-erythematous. Neck: No stridor.   Cardiovascular: Tachycardic rate, regular rhythm. Grossly normal heart sounds.  Good peripheral circulation. Respiratory: Increased respiratory effort.  No retractions. Lungs diminished bilaterally; Rales bibasilarly. Gastrointestinal: Obese. Soft and nontender. No distention. No abdominal bruits. No CVA tenderness. Musculoskeletal: No lower extremity tenderness. 2+ nonpitting BLE edema.  No joint effusions. Neurologic:  Normal speech and language. No gross focal neurologic deficits are appreciated.  Skin:  Skin is warm, dry and intact. No rash noted. Psychiatric: Mood and affect are normal. Speech and behavior are normal.  ____________________________________________   LABS (all labs ordered are listed, but only abnormal results are displayed)  Labs Reviewed  CBC WITH DIFFERENTIAL/PLATELET - Abnormal; Notable for the following:    WBC 13.9 (*)    RBC 3.62 (*)    Hemoglobin 10.6 (*)    HCT 32.2 (*)    RDW 15.0 (*)    Neutro Abs 11.4 (*)    Monocytes Absolute 1.0 (*)    All other components within normal limits  COMPREHENSIVE METABOLIC PANEL - Abnormal; Notable for the following:    Glucose, Bld 242 (*)    BUN 24 (*)    Creatinine, Ser 1.38 (*)    Calcium 8.8 (*)    Albumin 3.0 (*)    GFR calc non Af Amer 36 (*)    GFR calc Af Amer 42 (*)    All other components within normal limits  CULTURE, BLOOD (ROUTINE X 2)  CULTURE, BLOOD (ROUTINE X 2)  TROPONIN I  BRAIN NATRIURETIC PEPTIDE   ____________________________________________  EKG  ED ECG REPORT I, SUNG,JADE J, the attending  physician, personally viewed and interpreted this ECG.   Date: 07/14/2015  EKG Time: 0053  Rate: 105  Rhythm: atrial flutter, rate 105  Axis: LAD  Intervals:left bundle branch block  ST&T Change:  Nonspecific  ____________________________________________  RADIOLOGY  Portable chest x-ray (viewed by me, interpreted per Dr. Quintella Reichert): Small right pleural effusion and right lung base atelectasis versus infiltrate. Clinical correlation and follow-up recommended. ____________________________________________   PROCEDURES  Procedure(s) performed: None  Critical Care performed: Yes, see critical care note(s)   CRITICAL CARE Performed by: Paulette Blanch   Total critical care time: 30 minutes  Critical care time was exclusive of separately billable procedures and treating other patients.  Critical care was necessary to treat or prevent imminent or life-threatening deterioration.  Critical care was time spent personally by me on the following activities: development of treatment plan with patient and/or surrogate as well as nursing, discussions with consultants, evaluation of patient's response to treatment, examination of patient, obtaining history from patient or surrogate, ordering and performing treatments and interventions, ordering and review of laboratory studies, ordering and review of radiographic studies, pulse oximetry and re-evaluation of patient's condition.  ____________________________________________   INITIAL IMPRESSION / ASSESSMENT AND PLAN / ED COURSE  Pertinent labs & imaging results that were available during my care of the patient were reviewed by me and considered in my medical decision making (see chart for details).  75 year old female who presents with respiratory distress. Will obtain screening lab work including BNP, troponin, chest x-ray. Initiate DuoNeb, IV Solu-Medrol. Anticipate hospital admission.  ----------------------------------------- 1:55 AM on  07/14/2015 -----------------------------------------  Updated patient and daughter of laboratory and imaging results. Will initiate IV antibiotics for community acquired pneumonia. Discussed with hospitalist to evaluate patient in the emergency department for admission. ____________________________________________   FINAL CLINICAL IMPRESSION(S) / ED DIAGNOSES  Final diagnoses:  Hypoxia  Respiratory distress  CAP (community acquired pneumonia)  Asthma, mild intermittent, with acute exacerbation      NEW MEDICATIONS STARTED DURING THIS VISIT:  New Prescriptions   No medications on file     Note:  This document was prepared using Dragon voice recognition software and may include unintentional dictation errors.    Paulette Blanch, MD 07/14/15 [phone]

## 2015-07-14 NOTE — H&P (Signed)
Brandi Hoover is an 75 y.o. female.   Chief Complaint: Shortness of breath HPI: The patient presents to the emergency department via EMS with severe shortness of breath. She received some breathing treatments en route but continued to be dyspneic and hypoxic eventually requiring nonrebreather mask. Upon presentation the patient was wheezing with increased work of breathing. She was given intravenous steroids and levofloxacin. Chest x-ray was read as small right pleural effusion as well as potential infiltrate versus atelectasis in the right lower lobe. Emergency department staff called for admission by the time I arrived to the room the patient was no longer in extremis. She was able to speak in complete sentences without increased work of breathing. However, she remained very hypoxic requiring nonrebreather mask to maintain oxygen saturations greater than 92%. Her persistent hypoxia and potential pneumonia prompted the emergency department staff to call for admission.  Past Medical History  Diagnosis Date  . Diabetes mellitus without complication ([place])   . Hypertension   . High cholesterol   . Asthma     Past Surgical History  Procedure Laterality Date  . Right hip replacement      Family History  Problem Relation Age of Onset  . COPD Father   . Stroke Mother    Social History:  reports that she has never smoked. She has never used smokeless tobacco. She reports that she does not drink alcohol or use illicit drugs.  Allergies:  Allergies  Allergen Reactions  . Azithromycin Itching  . Gabapentin Other (See Comments)    Bad dreams, insomnia  . Omeprazole Other (See Comments)    Reaction: unknown  . Ramipril Other (See Comments)    Reaction: unknown    Prior to Admission medications   Medication Sig Start Date End Date Taking? Authorizing Provider  albuterol (VENTOLIN HFA) 108 (90 Base) MCG/ACT inhaler Inhale 2 puffs into the lungs every 6 (six) hours as needed for wheezing.   07/11/15  Yes Historical Provider, MD  amLODipine (NORVASC) 10 MG tablet Take 10 mg by mouth daily.   Yes Historical Provider, MD  aspirin EC 81 MG tablet Take 81 mg by mouth daily.   Yes Historical Provider, MD  benazepril (LOTENSIN) 20 MG tablet Take 20 mg by mouth daily.   Yes Historical Provider, MD  Calcium Carbonate-Vitamin D 600-400 MG-UNIT tablet Take 1 tablet by mouth at bedtime.   Yes Historical Provider, MD  Fluticasone-Salmeterol (ADVAIR) 250-50 MCG/DOSE AEPB Inhale 1 puff into the lungs 2 (two) times daily.   Yes Historical Provider, MD  glipiZIDE (GLUCOTROL) 10 MG tablet Take 10 mg by mouth 2 (two) times daily before a meal.   Yes Historical Provider, MD  guaiFENesin-codeine 100-10 MG/5ML syrup Take 5 mLs by mouth at bedtime as needed for cough.  07/08/15  Yes Historical Provider, MD  hydrochlorothiazide (MICROZIDE) 12.5 MG capsule Take 12.5 mg by mouth daily.   Yes Historical Provider, MD  metFORMIN (GLUCOPHAGE) 500 MG tablet Take 1,000-1,500 mg by mouth 2 (two) times daily with a meal. 1500 mg in the morning and 1000 mg in the evening   Yes Historical Provider, MD  Multiple Vitamin (MULTIVITAMIN WITH MINERALS) TABS tablet Take 1 tablet by mouth daily.   Yes Historical Provider, MD  simvastatin (ZOCOR) 20 MG tablet Take 20 mg by mouth at bedtime.   Yes Historical Provider, MD  vitamin B-12 (CYANOCOBALAMIN) 1000 MCG tablet Take 1,000 mcg by mouth daily.   Yes Historical Provider, MD     Results for  orders placed or performed during the hospital encounter of 07/14/15 (from the past 48 hour(s))  CBC with Differential     Status: Abnormal   Collection Time: 07/14/15 12:57 AM  Result Value Ref Range   WBC 13.9 (H) 3.6 - 11.0 K/uL   RBC 3.62 (L) 3.80 - 5.20 MIL/uL   Hemoglobin 10.6 (L) 12.0 - 16.0 g/dL   HCT 32.2 (L) 35.0 - 47.0 %   MCV 89.1 80.0 - 100.0 fL   MCH 29.3 26.0 - 34.0 pg   MCHC 32.9 32.0 - 36.0 g/dL   RDW 15.0 (H) 11.5 - 14.5 %   Platelets 361 150 - 440 K/uL    Neutrophils Relative % 82% %   Neutro Abs 11.4 (H) 1.4 - 6.5 K/uL   Lymphocytes Relative 10% %   Lymphs Abs 1.3 1.0 - 3.6 K/uL   Monocytes Relative 7% %   Monocytes Absolute 1.0 (H) 0.2 - 0.9 K/uL   Eosinophils Relative 0% %   Eosinophils Absolute 0.0 0 - 0.7 K/uL   Basophils Relative 1% %   Basophils Absolute 0.1 0 - 0.1 K/uL  Comprehensive metabolic panel     Status: Abnormal   Collection Time: 07/14/15 12:57 AM  Result Value Ref Range   Sodium 136 135 - 145 mmol/L   Potassium 4.1 3.5 - 5.1 mmol/L   Chloride 102 101 - 111 mmol/L   CO2 24 22 - 32 mmol/L   Glucose, Bld 242 (H) 65 - 99 mg/dL   BUN 24 (H) 6 - 20 mg/dL   Creatinine, Ser 1.38 (H) 0.44 - 1.00 mg/dL   Calcium 8.8 (L) 8.9 - 10.3 mg/dL   Total Protein 7.1 6.5 - 8.1 g/dL   Albumin 3.0 (L) 3.5 - 5.0 g/dL   AST 15 15 - 41 U/L   ALT 17 14 - 54 U/L   Alkaline Phosphatase 123 38 - 126 U/L   Total Bilirubin 0.6 0.3 - 1.2 mg/dL   GFR calc non Af Amer 36 (L) >60 mL/min   GFR calc Af Amer 42 (L) >60 mL/min    Comment: (NOTE) The eGFR has been calculated using the CKD EPI equation. This calculation has not been validated in all clinical situations. eGFR's persistently <60 mL/min signify possible Chronic Kidney Disease.    Anion gap 10 5 - 15  Troponin I     Status: None   Collection Time: 07/14/15 12:57 AM  Result Value Ref Range   Troponin I <0.03 <0.031 ng/mL    Comment:        NO INDICATION OF MYOCARDIAL INJURY.   Brain natriuretic peptide     Status: None   Collection Time: 07/14/15  1:00 AM  Result Value Ref Range   B Natriuretic Peptide 63.0 0.0 - 100.0 pg/mL   Dg Chest Port 1 View  07/14/2015  CLINICAL DATA:  75 year old female with cough and congestion and shortness of breath EXAM: PORTABLE CHEST 1 VIEW COMPARISON:  None. FINDINGS: Single-view of the chest demonstrates a small right pleural effusion with right lung base atelectasis versus infiltrate. The left lung is clear. There is no pneumothorax. Top-normal  cardiac silhouette. Osteopenia with degenerative changes of the spine and shoulders. No acute fracture. IMPRESSION: Small right pleural effusion and right lung base atelectasis versus infiltrate. Clinical correlation and follow-up recommended. Electronically Signed   By: Anner Crete M.D.   On: 07/14/2015 01:32    Review of Systems  Constitutional: Negative for fever and chills.  HENT: Negative  for sore throat and tinnitus.   Eyes: Negative for blurred vision and redness.  Respiratory: Positive for shortness of breath. Negative for cough.   Cardiovascular: Negative for chest pain, palpitations, orthopnea and PND.  Gastrointestinal: Negative for nausea, vomiting, abdominal pain and diarrhea.  Genitourinary: Negative for dysuria, urgency and frequency.  Musculoskeletal: Negative for myalgias and joint pain.  Skin: Negative for rash.       No lesions  Neurological: Negative for speech change, focal weakness and weakness.  Endo/Heme/Allergies: Does not bruise/bleed easily.       No temperature intolerance  Psychiatric/Behavioral: Positive for memory loss. Negative for depression and suicidal ideas.    Blood pressure 141/49, pulse 98, temperature 99 F (37.2 C), temperature source Oral, resp. rate 22, height 5' 6"  (1.676 m), weight 54.885 kg (121 lb), SpO2 100 %. Physical Exam   Assessment/Plan This is a 75 year old female admitted for her story failure with hypoxia. 1. Hypoxia: Initially treated to asthma although the patient states that she did not receive this diagnosis until about 10 years ago. My interpretation of her chest x-ray is that it is very unimpressive for pneumonia. Her effusion is small and atelectasis is minimal at best. I believe it is unlikely that she has classic asthma. 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 Either chemical can produce inflammatory lung disease. She appears remarkably well after steroids.  Levofloxacin was started in the emergency department which I will continue. I have consulted pulmonology for further guidance regarding PFTs and possible high-resolution CT scan to visualize interstitial disease. 2. Diabetes mellitus type 2: Hold oral hypoglycemic agents. Place the patient on sliding scale insulin while hospitalized 3. Essential hypertension: Controlled, continue amlodipine, benazepril and hydrochlorothiazide 4. Hyperlipidemia: Continue statin therapy 5. GI prophylaxis: None 6. DVT prophylaxis: Heparin The patient is a full code. Time spent on admission orders and critical care approximately 45 minutes  Harrie Foreman, MD 07/14/2015, 3:38 AM

## 2015-07-14 NOTE — Progress Notes (Signed)
Inpatient Diabetes Program Recommendations  AACE/ADA: New Consensus Statement on Inpatient Glycemic Control (2015)  Target Ranges:  Prepandial:   less than 140 mg/dL      Peak postprandial:   less than 180 mg/dL (1-2 hours)      Critically ill patients:  140 - 180 mg/dL   Lab Results  Component Value Date   GLUCAP 371* 07/14/2015    Review of Glycemic Control:  Results for MACAELA, MAUS (MRN [id number]) as of 07/14/2015 12:59  Ref. Range 07/14/2015 04:24 07/14/2015 07:27 07/14/2015 11:40  Glucose-Capillary Latest Ref Range: 65-99 mg/dL 271 (H) 298 (H) 371 (H)    Diabetes history: Type 2 diabetes Outpatient Diabetes medications: Metformin 1500 mg am and 1000 mg q PM, Glipizide 10 mg bid Current orders for Inpatient glycemic control:  Novolog sensitive tid with meals and HS  Inpatient Diabetes Program Recommendations:    While on steroids, consider adding Levemir 10 units daily and Novolog meal coverage 3 units tid with meals. Will follow.  Thanks, Adah Perl, RN, BC-ADM Inpatient Diabetes Coordinator Pager [phone] (8a-5p)

## 2015-07-14 NOTE — Progress Notes (Signed)
Pharmacy Antibiotic Note  Brandi Hoover is a 75 y.o. female admitted on 07/14/2015 with pneumonia.  Pharmacy has been consulted for Levaquin dosing.  Plan: Levaquin 750 mg IV q 48 hours ordered  Height: 5\' 6"  (167.6 cm) Weight: 121 lb (54.885 kg) IBW/kg (Calculated) : 59.3  Temp (24hrs), Avg:99 F (37.2 C), Min:99 F (37.2 C), Max:99 F (37.2 C)   Recent Labs Lab 07/14/15 0057  WBC 13.9*  CREATININE 1.38*    Estimated Creatinine Clearance: 30.1 mL/min (by C-G formula based on Cr of 1.38).    Allergies  Allergen Reactions  . Azithromycin Itching  . Gabapentin Other (See Comments)    Bad dreams, insomnia  . Omeprazole Other (See Comments)    Reaction: unknown  . Ramipril Other (See Comments)    Reaction: unknown    Antimicrobials this admission: Levaquin  >>    >>   Dose adjustments this admission:   Microbiology results: 6/5 BCx: pending 6/5 MRSA PCR: pending  6/5 CXR: R base atelectasis vs. infiltrate  Thank you for allowing pharmacy to be a part of this patient's care.  Brandi Hoover S 07/14/2015 4:32 AM

## 2015-07-15 DIAGNOSIS — R0600 Dyspnea, unspecified: Secondary | ICD-10-CM

## 2015-07-15 LAB — CBC WITH DIFFERENTIAL/PLATELET
BASOS ABS: 0 10*3/uL (ref 0–0.1)
Eosinophils Absolute: 0 10*3/uL (ref 0–0.7)
HCT: 30.2 % — ABNORMAL LOW (ref 35.0–47.0)
Hemoglobin: 10 g/dL — ABNORMAL LOW (ref 12.0–16.0)
Lymphocytes Relative: 5 %
Lymphs Abs: 0.7 10*3/uL — ABNORMAL LOW (ref 1.0–3.6)
MCH: 28.9 pg (ref 26.0–34.0)
MCHC: 33.3 g/dL (ref 32.0–36.0)
MCV: 86.7 fL (ref 80.0–100.0)
MONO ABS: 1.2 10*3/uL — AB (ref 0.2–0.9)
Neutro Abs: 12.8 10*3/uL — ABNORMAL HIGH (ref 1.4–6.5)
Neutrophils Relative %: 87 %
PLATELETS: 398 10*3/uL (ref 150–440)
RBC: 3.48 MIL/uL — ABNORMAL LOW (ref 3.80–5.20)
RDW: 14.4 % (ref 11.5–14.5)
WBC: 14.7 10*3/uL — ABNORMAL HIGH (ref 3.6–11.0)

## 2015-07-15 LAB — BASIC METABOLIC PANEL
ANION GAP: 9 (ref 5–15)
BUN: 36 mg/dL — ABNORMAL HIGH (ref 6–20)
CALCIUM: 8.8 mg/dL — AB (ref 8.9–10.3)
CO2: 29 mmol/L (ref 22–32)
CREATININE: 1.52 mg/dL — AB (ref 0.44–1.00)
Chloride: 99 mmol/L — ABNORMAL LOW (ref 101–111)
GFR, EST AFRICAN AMERICAN: 37 mL/min — AB (ref >60)
GFR, EST NON AFRICAN AMERICAN: 32 mL/min — AB (ref >60)
GLUCOSE: 349 mg/dL — AB (ref 65–99)
Potassium: 3.7 mmol/L (ref 3.5–5.1)
Sodium: 137 mmol/L (ref 135–145)

## 2015-07-15 LAB — GLUCOSE, CAPILLARY
Glucose-Capillary: 291 mg/dL — ABNORMAL HIGH (ref 65–99)
Glucose-Capillary: 215 mg/dL — ABNORMAL HIGH (ref 65–99)
Glucose-Capillary: 295 mg/dL — ABNORMAL HIGH (ref 65–99)
Glucose-Capillary: 239 mg/dL — ABNORMAL HIGH (ref 65–99)

## 2015-07-15 MED ORDER — INSULIN DETEMIR 100 UNIT/ML ~~LOC~~ SOLN
10.0000 [IU] | Freq: Every day | SUBCUTANEOUS | Status: DC
  Administered 2015-07-15 – 2015-07-16 (×2): 10 [IU] via SUBCUTANEOUS
  Filled 2015-07-15 (×2): qty 0.1

## 2015-07-15 MED ORDER — INSULIN ASPART 100 UNIT/ML ~~LOC~~ SOLN
0.0000 [IU] | Freq: Three times a day (TID) | SUBCUTANEOUS | Status: DC
  Administered 2015-07-15: 8 [IU] via SUBCUTANEOUS
  Filled 2015-07-15: qty 8

## 2015-07-15 MED ORDER — INSULIN ASPART 100 UNIT/ML ~~LOC~~ SOLN
0.0000 [IU] | Freq: Three times a day (TID) | SUBCUTANEOUS | Status: DC
  Administered 2015-07-15: 11 [IU] via SUBCUTANEOUS
  Administered 2015-07-15 – 2015-07-16 (×4): 7 [IU] via SUBCUTANEOUS
  Administered 2015-07-17 (×2): 3 [IU] via SUBCUTANEOUS
  Administered 2015-07-17: 11 [IU] via SUBCUTANEOUS
  Administered 2015-07-18 (×2): 4 [IU] via SUBCUTANEOUS
  Filled 2015-07-15: qty 4
  Filled 2015-07-15: qty 11
  Filled 2015-07-15: qty 4
  Filled 2015-07-15: qty 7
  Filled 2015-07-15: qty 11
  Filled 2015-07-15: qty 7
  Filled 2015-07-15: qty 3
  Filled 2015-07-15 (×2): qty 7
  Filled 2015-07-15: qty 4

## 2015-07-15 MED ORDER — INSULIN ASPART 100 UNIT/ML ~~LOC~~ SOLN
0.0000 [IU] | Freq: Every day | SUBCUTANEOUS | Status: DC
  Administered 2015-07-15: 2 [IU] via SUBCUTANEOUS
  Filled 2015-07-15: qty 2
  Filled 2015-07-15: qty 3

## 2015-07-15 MED ORDER — INSULIN ASPART 100 UNIT/ML ~~LOC~~ SOLN: 0.0000 [IU] | Freq: Every day | SUBCUTANEOUS | Status: DC

## 2015-07-15 MED ORDER — INSULIN ASPART 100 UNIT/ML ~~LOC~~ SOLN: 0.0000 [IU] | SUBCUTANEOUS | Status: DC

## 2015-07-15 MED ORDER — INSULIN ASPART 100 UNIT/ML ~~LOC~~ SOLN
3.0000 [IU] | Freq: Three times a day (TID) | SUBCUTANEOUS | Status: DC
  Administered 2015-07-15 – 2015-07-18 (×10): 3 [IU] via SUBCUTANEOUS
  Filled 2015-07-15 (×8): qty 3

## 2015-07-15 NOTE — Evaluation (Signed)
Physical Therapy Evaluation Patient Details Name: Brandi Hoover MRN: [id number] DOB: [birth year] Today's Date: 07/15/2015   History of Present Illness  75 yo F presented to ED for 1wk long of cough/congestion adn increased SOB found to have respiratory failure with hypoxia.   Clinical Impression  Pt demonstrated generalized weakness and difficulty walking. She becomes fatigued quickly with minimal activity, requiring cues during the session for pursed lip breathing to maintain oxygen saturation. She ranged 90 to 100% on 3L during session. She requires min A for bed mobility. Transfers and ambulation limited to 5 ft with FWW required min A. Cues for pursed lip breathing, staying close to FWW and postural correction. STR recommended after hospital discharge to address deficits of strength, endurance, balance and gait to progress towards PLOF before returning home alone. Pt will benefit from skilled PT services to increase functional I and mobility for safe discharge.     Follow Up Recommendations SNF    Equipment Recommendations  Rolling walker with 5" wheels;Other (comment) (pt has rollator but will be too unsteady for now)    Recommendations for Other Services       Precautions / Restrictions Precautions Precautions: Fall Restrictions Weight Bearing Restrictions: No      Mobility  Bed Mobility Overal bed mobility: Needs Assistance Bed Mobility: Supine to Sit     Supine to sit: Min assist;HOB elevated     General bed mobility comments: uses rail, extra time needed  Transfers Overall transfer level: Needs assistance Equipment used: Rolling walker (2 wheeled) Transfers: Sit to/from [facility] Sit to Stand: Min assist;From elevated surface Stand pivot transfers: Min assist;From elevated surface       General transfer comment: cues for hand placement and anterior weight shifting  Ambulation/Gait Ambulation/Gait assistance: Min assist Ambulation Distance  (Feet): 5 Feet Assistive device: Rolling walker (2 wheeled) Gait Pattern/deviations: Step-to pattern;Trunk flexed Gait velocity: reduced Gait velocity interpretation: Below normal speed for age/gender General Gait Details: Very slow gait. No LOB. Fatigues quickly and becomes SOB. Cues for staying inside FWW, purse lip breathing.  Stairs            Wheelchair Mobility    Modified Rankin (Stroke Patients Only)       Balance Overall balance assessment: Needs assistance Sitting-balance support: Bilateral upper extremity supported;Feet supported Sitting balance-Leahy Scale: Fair     Standing balance support: Bilateral upper extremity supported Standing balance-Leahy Scale: Fair Standing balance comment: maintains without LOB with UE support, standing tolerance limited                             Pertinent Vitals/Pain Pain Assessment: No/denies pain    Home Living Family/patient expects to be discharged to:: Private residence Living Arrangements: Alone Available Help at Discharge: Family Type of Home: House Home Access: Stairs to enter Entrance Stairs-Rails: Can reach both Entrance Stairs-Number of Steps: 3 Home Layout: One level Home Equipment: Walker - 4 wheels      Prior Function Level of Independence: Independent with assistive device(s)         Comments: limited household ambulation with rollator, wheelchair for longer distances, assistance from family for grocery shopping     Hand Dominance        Extremity/Trunk Assessment   Upper Extremity Assessment: Generalized weakness           Lower Extremity Assessment: Generalized weakness (grossly 3+/5)         Communication  Communication: No difficulties  Cognition Arousal/Alertness: Awake/alert Behavior During Therapy: WFL for tasks assessed/performed Overall Cognitive Status: Within Functional Limits for tasks assessed                      General Comments General  comments (skin integrity, edema, etc.): 3L O2, SOB and fatigue with minimal activity    Exercises Other Exercises Other Exercises: B LE supine therex: ankle pumps, QS, GS, heel slides, hip abd slides x10 each. Cues for pursed lip breathing to maintain saturation.       Assessment/Plan    PT Assessment Patient needs continued PT services  PT Diagnosis Difficulty walking;Generalized weakness   PT Problem List Decreased strength;Decreased activity tolerance;Decreased balance;Decreased mobility;Decreased safety awareness;Cardiopulmonary status limiting activity;Obesity  PT Treatment Interventions DME instruction;Gait training;Stair training;Therapeutic activities;Therapeutic exercise;Balance training;Neuromuscular re-education;Patient/family education   PT Goals (Current goals can be found in the Care Plan section) Acute Rehab PT Goals Patient Stated Goal: to get home PT Goal Formulation: With patient Time For Goal Achievement: 07/29/15 Potential to Achieve Goals: Fair    Frequency Min 2X/week   Barriers to discharge Inaccessible home environment;Decreased caregiver support steps to enter, lives alone    Co-evaluation               End of Session Equipment Utilized During Treatment: Oxygen Activity Tolerance: Patient limited by fatigue Patient left: in chair;with call bell/phone within reach Nurse Communication: Mobility status;Other (comment) (pt up in chair)         Time: [id number] PT Time Calculation (min) (ACUTE ONLY): 29 min   Charges:   PT Evaluation $PT Eval Moderate Complexity: 1 Procedure PT Treatments $Therapeutic Exercise: 8-22 mins   PT G Codes:        Neoma Laming, PT, DPT  07/15/2015, 2:55 PM [phone]

## 2015-07-15 NOTE — Progress Notes (Signed)
PULMONARY / CRITICAL CARE MEDICINE   Name: Brandi Hoover MRN: [id number] DOB: [birth year]    ADMISSION DATE:  07/14/2015  BRIEF HISTORY: Brandi, Hoover is a 75 year old female with past medical history significant history of Diabetese melitus, Hypertension, Hyperlipidemia ,COPD. Patient presented to EMS on 07/14/15 via EMS with severe shortness of breath, she received some treatments with bronchodilators, but continued to be hypoxic. She received some intravenous steroids and levofloxacin. Patient was started on a non -rebreather mask. CXR was concerning for Small right pleural effusion versus infiltrate. PCCM team was consulted on 6/5 for further management.  SUBJECTIVE:  Cough with productive sputum overnight, feeling weak with nausea today.  Glucose elevated overnight, ssi adjusted.    VITAL SIGNS: Temp:  [98.2 F (36.8 C)-98.7 F (37.1 C)] 98.7 F (37.1 C) (06/06 0800) Pulse Rate:  [87-106] 94 (06/06 1100) Resp:  [16-24] 19 (06/06 1100) BP: (129-155)/(47-99) 138/58 mmHg (06/06 1100) SpO2:  [91 %-97 %] 96 % (06/06 1132) Weight:  [268 lb 15.4 oz (122 kg)] 268 lb 15.4 oz (122 kg) (06/06 0543) HEMODYNAMICS:   VENTILATOR SETTINGS:   INTAKE / OUTPUT:  Intake/Output Summary (Last 24 hours) at 07/15/15 1230 Last data filed at 07/15/15 1100  Gross per 24 hour  Intake    353 ml  Output   1625 ml  Net  -1272 ml    Review of Systems  Constitutional: Positive for malaise/fatigue. Negative for fever and chills.  Eyes: Negative for blurred vision and double vision.  Respiratory: Positive for cough, sputum production and shortness of breath.   Cardiovascular: Negative for chest pain.  Gastrointestinal: Positive for nausea. Negative for heartburn and vomiting.  Genitourinary: Negative for dysuria.  Skin: Negative for rash.  Neurological: Positive for weakness. Negative for dizziness and headaches.    Physical Exam  Constitutional: She is oriented to person, place, and time and  well-developed, well-nourished, and in no distress.  HENT:  Head: Normocephalic and atraumatic.  Right Ear: External ear normal.  Left Ear: External ear normal.  Eyes: Pupils are equal, round, and reactive to light.  Neck: Normal range of motion. Neck supple.  Cardiovascular: Normal rate, regular rhythm, normal heart sounds and intact distal pulses.   No murmur heard. Pulmonary/Chest: Effort normal. No respiratory distress. She exhibits no tenderness.  Coarse upper airway sounds, dec basilar BS  Abdominal: Soft. She exhibits no distension.  Musculoskeletal: Normal range of motion.  Neurological: She is alert and oriented to person, place, and time.  Skin: Skin is warm and dry. No erythema.  Psychiatric: Affect normal.  Nursing note and vitals reviewed.    LABS:  CBC  Recent Labs Lab 07/14/15 0057 07/15/15 0447  WBC 13.9* 14.7*  HGB 10.6* 10.0*  HCT 32.2* 30.2*  PLT 361 398   Coag's No results for input(s): APTT, INR in the last 168 hours. BMET  Recent Labs Lab 07/14/15 0057 07/15/15 0447  NA 136 137  K 4.1 3.7  CL 102 99*  CO2 24 29  BUN 24* 36*  CREATININE 1.38* 1.52*  GLUCOSE 242* 349*   Electrolytes  Recent Labs Lab 07/14/15 0057 07/15/15 0447  CALCIUM 8.8* 8.8*   Sepsis Markers No results for input(s): LATICACIDVEN, PROCALCITON, O2SATVEN in the last 168 hours. ABG No results for input(s): PHART, PCO2ART, PO2ART in the last 168 hours. Liver Enzymes  Recent Labs Lab 07/14/15 0057  AST 15  ALT 17  ALKPHOS 123  BILITOT 0.6  ALBUMIN 3.0*   Cardiac Enzymes  Recent Labs Lab 07/14/15 0057  TROPONINI <0.03   Glucose  Recent Labs Lab 07/14/15 0727 07/14/15 1140 07/14/15 1629 07/14/15 2140 07/15/15 0728 07/15/15 1122  GLUCAP 298* 371* 337* 362* 291* 215*    Imaging No results found.  STUDIES:  None  CULTURES: 6/5 blood cultures>>  ANTIBIOTICS: 6/5 levofloxacin>>  SIGNIFICANT EVENTS: 6/5 admitted to [facility] with asthma  LINES/TUBES: None  DISCUSSION: 75 years old female with a history of diabetes, hypertension, hyperlipidemia, asthma presents to [facility] with shortness of breath and small right-sided pleural effusion  ASSESSMENT / PLAN: PULMONARY A: -RLL PNA/CAP -History of asthma -Small right-sided pleural effusion P:  -Continue to support with oxygen to keep sats greater than 88% -Continue antibiotics per primary  -Continue bronchodilators  -Continue guaifenesin  -Flutter valve/incentive spirometer  -Continue prednisone, taper -CXR prn -Pleural effusion is small and does not require any intervention at this time will watch  CARDIOVASCULAR A:  History of hypertension History of hyperlipidemia P:  -Continuous telemetry  -Continue amlodipine -Continue statin therapy  RENAL A:  Acute kidney injury P:  -Monitor intake output  -follow-up chemistry -Replace electrolytes per ICU protocol  GASTROINTESTINAL A:  No active issues P  Heart healthy diet with modified carb  HEMATOLOGICAL A:  No active issues P:   Transfuse if Hgb <7  heparin for DVT prophylaxis  INFECTIOUS A:  Leukocytosis possibly due to steroids P:  Follow culture Monitor fever curve Continue antibiotics  ENDOCRINE A:  Diabetes mellitus P:  -Follow blood sugars before meals and bedtime's  -Sliding scale insulin coverage -added levimir and meal coverage insulin   NEUROLOGIC A:  No active issues P:  RASS goal:0   Thank you for consulting [facility], Please feel free to contacts [place] with any questions at [phone] (please enter 7-digits).  I have personally obtained a history, examined the patient, evaluated laboratory and imaging results, formulated the assessment and plan and placed orders.  Pulmonary Care Time devoted to patient care services described in this note is 30 minutes.   Vilinda Boehringer, MD [facility] Pager [phone] (please enter 7-digits) On Call Pager [phone] (please enter 7-digits)  Note: This note was prepared with Dragon dictation along with smaller phrase technology. Any transcriptional errors that result from this process are unintentional.

## 2015-07-15 NOTE — Progress Notes (Signed)
Pharmacy Antibiotic Note  Brandi Hoover is a 75 y.o. female admitted on 07/14/2015 with pneumonia.  Pharmacy has been consulted for Levaquin dosing.  Plan: Continue Levaquin 750 mg IV q 48 hours.  Height: 5\' 6"  (167.6 cm) Weight: 268 lb 15.4 oz (122 kg) IBW/kg (Calculated) : 59.3  Temp (24hrs), Avg:98.4 F (36.9 C), Min:98.2 F (36.8 C), Max:98.7 F (37.1 C)   Recent Labs Lab 07/14/15 0057 07/15/15 0447  WBC 13.9* 14.7*  CREATININE 1.38* 1.52*    Estimated Creatinine Clearance: 42 mL/min (by C-G formula based on Cr of 1.52).    Allergies  Allergen Reactions  . Azithromycin Itching  . Gabapentin Other (See Comments)    Bad dreams, insomnia  . Omeprazole Other (See Comments)    Reaction: unknown  . Ramipril Other (See Comments)    Reaction: unknown    Antimicrobials this admission: Levaquin 6/5  >>     Dose adjustments this admission:   Microbiology results: 6/5 BCx: NGTD x 2 6/5 MRSA PCR: negative  6/5 CXR: R base atelectasis vs. infiltrate  Thank you for allowing pharmacy to be a part of this patient's care.  Ulice Dash D 07/15/2015 2:05 PM

## 2015-07-15 NOTE — Progress Notes (Signed)
[facility] [place] NAME: Lilit Hairfield    MR#:  [id number]  DATE OF BIRTH:  12/24/1950  SUBJECTIVE:  CHIEF COMPLAINT:   Chief Complaint  Patient presents with  . Respiratory Distress   -  Currently on 3 L nasal cannula. Breathing has much improved. -Still complains of weakness and some dyspnea. -Admitted for COPD and pneumonia  REVIEW OF SYSTEMS:  Review of Systems  Constitutional: Positive for malaise/fatigue. Negative for fever and chills.  HENT: Negative for ear discharge, ear pain and nosebleeds.   Eyes: Negative for blurred vision and double vision.  Respiratory: Positive for shortness of breath. Negative for cough and wheezing.   Cardiovascular: Negative for chest pain, palpitations and leg swelling.  Gastrointestinal: Negative for nausea, vomiting, abdominal pain, diarrhea and constipation.  Genitourinary: Negative for dysuria and urgency.  Musculoskeletal: Negative for myalgias and neck pain.  Neurological: Negative for dizziness, speech change, focal weakness, seizures and headaches.  Endo/Heme/Allergies: Does not bruise/bleed easily.  Psychiatric/Behavioral: Negative for depression.    DRUG ALLERGIES:   Allergies  Allergen Reactions  . Azithromycin Itching  . Gabapentin Other (See Comments)    Bad dreams, insomnia  . Omeprazole Other (See Comments)    Reaction: unknown  . Ramipril Other (See Comments)    Reaction: unknown    VITALS:  Blood pressure 138/58, pulse 94, temperature 98.7 F (37.1 C), temperature source Oral, resp. rate 19, height 5\' 6"  (1.676 m), weight 122 kg (268 lb 15.4 oz), SpO2 96 %.  PHYSICAL EXAMINATION:  Physical Exam  GENERAL:  75 y.o.-year-old patient Obese lying in the bed with no acute distress.  EYES: Pupils equal, round, reactive to light and accommodation. No scleral icterus. Extraocular muscles intact.  HEENT: Head atraumatic, normocephalic. Oropharynx and nasopharynx clear.   NECK:  Supple, no jugular venous distention. No thyroid enlargement, no tenderness.  LUNGS: Diffuse scattered wheezing bilaterally, no  rales,rhonchi or crepitation. No use of accessory muscles of respiration. Bibasilar rhonchi noted CARDIOVASCULAR: S1, S2 normal. No murmurs, rubs, or gallops.  ABDOMEN: Soft, nontender, nondistended. Bowel sounds present. No organomegaly or mass.  EXTREMITIES: No pedal edema, cyanosis, or clubbing.  NEUROLOGIC: Cranial nerves II through XII are intact. Muscle strength 5/5 in all extremities. Sensation intact. Gait not checked.  PSYCHIATRIC: The patient is alert and oriented x 3.  SKIN: No obvious rash, lesion, or ulcer.    LABORATORY PANEL:   CBC  Recent Labs Lab 07/15/15 0447  WBC 14.7*  HGB 10.0*  HCT 30.2*  PLT 398   ------------------------------------------------------------------------------------------------------------------  Chemistries   Recent Labs Lab 07/14/15 0057 07/15/15 0447  NA 136 137  K 4.1 3.7  CL 102 99*  CO2 24 29  GLUCOSE 242* 349*  BUN 24* 36*  CREATININE 1.38* 1.52*  CALCIUM 8.8* 8.8*  AST 15  --   ALT 17  --   ALKPHOS 123  --   BILITOT 0.6  --    ------------------------------------------------------------------------------------------------------------------  Cardiac Enzymes  Recent Labs Lab 07/14/15 0057  TROPONINI <0.03   ------------------------------------------------------------------------------------------------------------------  RADIOLOGY:  Dg Chest Port 1 View  07/14/2015  CLINICAL DATA:  75 year old female with cough and congestion and shortness of breath EXAM: PORTABLE CHEST 1 VIEW COMPARISON:  None. FINDINGS: Single-view of the chest demonstrates a small right pleural effusion with right lung base atelectasis versus infiltrate. The left lung is clear. There is no pneumothorax. Top-normal cardiac silhouette. Osteopenia with degenerative changes of the spine and shoulders. No acute  fracture. IMPRESSION: Small right pleural effusion and right lung base atelectasis versus infiltrate. Clinical correlation and follow-up recommended. Electronically Signed   By: Anner Crete M.D.   On: 07/14/2015 01:32    EKG:   Orders placed or performed during the hospital encounter of 07/14/15  . EKG 12-Lead  . EKG 12-Lead  . ED EKG  . ED EKG    ASSESSMENT AND PLAN:   75y/oF past medical history significant for hypertension, diabetes mellitus, asthma presents to the hospital secondary to hypoxia and dyspnea  #1 acute hypoxic respiratory failure on admission-not on any home oxygen. Off nonrebreather mask and currently on 3 L oxygen. -Chest x-ray with right lung base infiltrate. -On prednisone taper for reactive airway disease and also on antibiotics with Levaquin -Appreciate pulmonary consult -Continue inhalers  #2 diabetes mellitus -hold metformin and glipizide A1c is 7.8 -Sugars elevated due to being on steroids. On Lantus, pre-meal insulin and sliding scale insulin  #3 hypertension-on Norvasc, benazepril, hydrochlorothiazide  #4 DVT prophylaxis-subcutaneous heparin  #5 acute renal failure on CK D-baseline creatinine around 1.2. Will order gentle hydration. Monitor. Avoid nephrotoxins  Physical therapy consult requested   All the records are reviewed and case discussed with Care Management/Social Workerr. Management plans discussed with the patient, family and they are in agreement.  CODE STATUS: Full Code  TOTAL TIME TAKING CARE OF THIS PATIENT: 37 minutes.   POSSIBLE D/C IN 2 DAYS, DEPENDING ON CLINICAL CONDITION.   Tamaira Ciriello M.D on 07/15/2015 at 2:50 PM  Between 7am to 6pm - Pager - [phone]  After 6pm go to [email or web address] - password [email or web address] [facility]  [facility]  Office  [phone]  CC: Primary care physician; No primary care provider on file.

## 2015-07-16 DIAGNOSIS — J4530 Mild persistent asthma, uncomplicated: Secondary | ICD-10-CM

## 2015-07-16 DIAGNOSIS — J9601 Acute respiratory failure with hypoxia: Secondary | ICD-10-CM

## 2015-07-16 LAB — GLUCOSE, CAPILLARY
Glucose-Capillary: 208 mg/dL — ABNORMAL HIGH (ref 65–99)
Glucose-Capillary: 209 mg/dL — ABNORMAL HIGH (ref 65–99)
Glucose-Capillary: 220 mg/dL — ABNORMAL HIGH (ref 65–99)
Glucose-Capillary: 121 mg/dL — ABNORMAL HIGH (ref 65–99)

## 2015-07-16 MED ORDER — GUAIFENESIN-CODEINE 100-10 MG/5ML PO SOLN: 5.0000 mL | Freq: Four times a day (QID) | ORAL | Status: DC | PRN

## 2015-07-16 MED ORDER — INSULIN GLARGINE 100 UNIT/ML ~~LOC~~ SOLN
12.0000 [IU] | Freq: Every day | SUBCUTANEOUS | Status: DC
  Administered 2015-07-16 – 2015-07-17 (×2): 12 [IU] via SUBCUTANEOUS
  Filled 2015-07-16 (×4): qty 0.12

## 2015-07-16 MED ORDER — LEVOFLOXACIN 750 MG PO TABS
750.0000 mg | ORAL_TABLET | ORAL | Status: DC
  Administered 2015-07-16 – 2015-07-18 (×2): 750 mg via ORAL
  Filled 2015-07-16: qty 2
  Filled 2015-07-16: qty 1

## 2015-07-16 MED ORDER — GUAIFENESIN ER 600 MG PO TB12
600.0000 mg | ORAL_TABLET | Freq: Two times a day (BID) | ORAL | Status: DC
  Administered 2015-07-16 – 2015-07-18 (×5): 600 mg via ORAL
  Filled 2015-07-16 (×5): qty 1

## 2015-07-16 MED ORDER — ENOXAPARIN SODIUM 40 MG/0.4ML ~~LOC~~ SOLN
40.0000 mg | Freq: Two times a day (BID) | SUBCUTANEOUS | Status: DC
  Administered 2015-07-16 – 2015-07-18 (×4): 40 mg via SUBCUTANEOUS
  Filled 2015-07-16 (×4): qty 0.4

## 2015-07-16 NOTE — Progress Notes (Signed)
Physical Therapy Treatment Patient Details Name: KIJUANA POITRA MRN: [id number] DOB: Mar 08, 1940 Today's Date: 07/16/2015    History of Present Illness 75 yo F presented to ED for 1wk long of cough/congestion adn increased SOB found to have respiratory failure with hypoxia.     PT Comments    Pt gradually progressing towards goals. She was able to perform STS with less assistance. Pt ambulated up to 60 ft with FWW and min A, however, fatigued quickly. Frequent cues for purse lip breathing required to maintain oxygen saturation and to take longer steps to facilitate energy conservation. Due to continued level of fatigue and weakness, current recommendation remains appropriate for STR. Discussed this with the pt, and explained why she would be unsafe to return home alone at this time. Plan to progress as tolerated.  Follow Up Recommendations  SNF     Equipment Recommendations  Rolling walker with 5" wheels;Other (comment)    Recommendations for Other Services       Precautions / Restrictions Precautions Precautions: Fall Restrictions Weight Bearing Restrictions: No    Mobility  Bed Mobility               General bed mobility comments: up in recliner, NT  Transfers Overall transfer level: Needs assistance Equipment used: Rolling walker (2 wheeled) Transfers: Sit to/from [facility] Sit to Stand: Min guard Stand pivot transfers: Min guard       General transfer comment: improved ability from yesterday  Ambulation/Gait Ambulation/Gait assistance: Min assist Ambulation Distance (Feet): 60 Feet Assistive device: Rolling walker (2 wheeled) Gait Pattern/deviations: Step-to pattern;Decreased stride length;Trunk flexed Gait velocity: reduced Gait velocity interpretation: Below normal speed for age/gender General Gait Details: Very slow gait. No LOB. Fatigues quickly and becomes SOB. Cues for staying inside FWW, purse lip breathing.   Stairs             Wheelchair Mobility    Modified Rankin (Stroke Patients Only)       Balance Overall balance assessment: Needs assistance Sitting-balance support: No upper extremity supported Sitting balance-Leahy Scale: Fair     Standing balance support: Bilateral upper extremity supported Standing balance-Leahy Scale: Fair Standing balance comment: no LOB                    Cognition Arousal/Alertness: Awake/alert Behavior During Therapy: WFL for tasks assessed/performed Overall Cognitive Status: Within Functional Limits for tasks assessed                      Exercises Other Exercises Other Exercises: B LE  therex: ankle pumps, QS, GS, hip abd slides; seated LAQs, marching x10 each. Cues for pursed lip breathing to maintain saturation.  Other Exercises: Pt ambulated 60 ft with FWW and min A with slow gait. Fatigues quickly and tends to breath shallowly. Requires frequent cues for purse lip breathing. SpO2 ranged 88 to 96% on 3L.    General Comments General comments (skin integrity, edema, etc.): 3L O2, fatigues with minimal activity      Pertinent Vitals/Pain Pain Assessment: No/denies pain    Home Living                      Prior Function            PT Goals (current goals can now be found in the care plan section) Acute Rehab PT Goals Patient Stated Goal: to get home PT Goal Formulation: With patient Time For Goal Achievement: 07/29/15  Potential to Achieve Goals: Fair Progress towards PT goals: Progressing toward goals    Frequency  Min 2X/week    PT Plan Current plan remains appropriate    Co-evaluation             End of Session Equipment Utilized During Treatment: Oxygen;Gait belt Activity Tolerance: Patient tolerated treatment well;Patient limited by fatigue Patient left: in chair;with call bell/phone within reach     Time: 1505-1528 PT Time Calculation (min) (ACUTE ONLY): 23 min  Charges:  $Gait Training: 8-22  mins $Therapeutic Exercise: 8-22 mins                    G Codes:      Neoma Laming, PT, DPT  07/16/2015, 3:58 PM [phone]

## 2015-07-16 NOTE — Care Management (Signed)
patient has been evaluated by physical therapy and initial recommendation is short term skilled nursing placement.  Patient asks "Can they make me go?"  Discussed that it is her choice and she is competent to make her own decisions. Discussed that recommendation can change as her medical condition improves.   She is very agreeable to home health and has had services through [facility].  Requests referral be sent to that agency.  Heads up referral to [facility].  Patient will need to be assessed for need of home 02 prior to discharge.  She has a very deep congested sounding cough throughout interview.

## 2015-07-16 NOTE — Progress Notes (Signed)
PULMONARY / CRITICAL CARE MEDICINE   Name: Brandi Hoover MRN: [id number] DOB: Feb 03, 1941    ADMISSION DATE:  07/14/2015  BRIEF HISTORY: Brandi Hoover, Brandi Hoover is a 75 year old female with past medical history significant history of Diabetese melitus, Hypertension, Hyperlipidemia ,COPD. Patient presented to EMS on 07/14/15 via EMS with severe shortness of breath, she received some treatments with bronchodilators, but continued to be hypoxic. She received some intravenous steroids and levofloxacin. Patient was started on a non -rebreather mask. CXR was concerning for Small right pleural effusion versus infiltrate. PCCM team was consulted on 6/5 for further management.  SUBJECTIVE:  Cough with productive sputum overnight, feeling much better today. 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  VITAL SIGNS: Temp:  [97.8 F (36.6 C)-98 F (36.7 C)] 97.8 F (36.6 C) (06/07 0800) Pulse Rate:  [80-93] 82 (06/07 0800) Resp:  [15-20] 19 (06/07 0800) BP: (117-145)/(52-60) 136/58 mmHg (06/07 0800) SpO2:  [91 %-99 %] 94 % (06/07 1131) FiO2 (%):  [32 %] 32 % (06/07 1131) Weight:  [274 lb 14.6 oz (124.7 kg)] 274 lb 14.6 oz (124.7 kg) (06/07 0500) HEMODYNAMICS:   VENTILATOR SETTINGS: Vent Mode:  [-]  FiO2 (%):  [32 %] 32 % INTAKE / OUTPUT:  Intake/Output Summary (Last 24 hours) at 07/16/15 1207 Last data filed at 07/16/15 0921  Gross per 24 hour  Intake     50 ml  Output   1300 ml  Net  -1250 ml    Review of Systems  Constitutional: Positive for malaise/fatigue. Negative for fever and chills.  Eyes: Negative for blurred vision and double vision.  Respiratory: Positive for cough and sputum production.        Coarse upper airway sounds  Cardiovascular: Negative for chest pain.  Gastrointestinal: Positive for nausea.  Negative for heartburn and vomiting.  Genitourinary: Negative for dysuria.  Skin: Negative for rash.  Neurological: Positive for weakness. Negative for dizziness and headaches.    Physical Exam  Constitutional: She is oriented to person, place, and time and well-developed, well-nourished, and in no distress.  HENT:  Head: Normocephalic and atraumatic.  Right Ear: External ear normal.  Left Ear: External ear normal.  Eyes: Pupils are equal, round, and reactive to light.  Neck: Normal range of motion. Neck supple.  Cardiovascular: Normal rate, regular rhythm, normal heart sounds and intact distal pulses.   No murmur heard. Pulmonary/Chest: Effort normal. No respiratory distress. She exhibits no tenderness.  Coarse upper airway sounds, dec basilar BS  Abdominal: Soft. She exhibits no distension.  Musculoskeletal: Normal range of motion.  Neurological: She is alert and oriented to person, place, and time.  Skin: Skin is warm and dry. No erythema.  Psychiatric: Affect normal.  Nursing note and vitals reviewed.    LABS:  CBC  Recent Labs Lab 07/14/15 0057 07/15/15 0447  WBC 13.9* 14.7*  HGB 10.6* 10.0*  HCT 32.2* 30.2*  PLT 361 398   Coag's No results for input(s): APTT, INR in the last 168 hours. BMET  Recent Labs Lab 07/14/15 0057 07/15/15 0447  NA 136 137  K 4.1 3.7  CL 102 99*  CO2 24 29  BUN 24* 36*  CREATININE 1.38* 1.52*  GLUCOSE 242* 349*   Electrolytes  Recent Labs Lab 07/14/15 0057 07/15/15 0447  CALCIUM 8.8*  8.8*   Sepsis Markers No results for input(s): LATICACIDVEN, PROCALCITON, O2SATVEN in the last 168 hours. ABG No results for input(s): PHART, PCO2ART, PO2ART in the last 168 hours. Liver Enzymes  Recent Labs Lab 07/14/15 0057  AST 15  ALT 17  ALKPHOS 123  BILITOT 0.6  ALBUMIN 3.0*   Cardiac Enzymes  Recent Labs Lab 07/14/15 0057  TROPONINI <0.03   Glucose  Recent Labs Lab 07/15/15 0728 07/15/15 1122 07/15/15 1621  07/15/15 2218 07/16/15 0734 07/16/15 1136  GLUCAP 291* 215* 295* 239* 208* 209*    Imaging No results found.  STUDIES:  None  CULTURES: 6/5 blood cultures>>  ANTIBIOTICS: 6/5 levofloxacin>>  SIGNIFICANT EVENTS: 6/5 admitted to [facility] with asthma  LINES/TUBES: None  DISCUSSION: 75 years old female with a history of diabetes, hypertension, hyperlipidemia, asthma presents to [facility] with shortness of breath and small right-sided pleural effusion  ASSESSMENT / PLAN: PULMONARY A: -RLL PNA/CAP -History of asthma -Small right-sided pleural effusion -Asthma - on advair P:  -Continue to support with oxygen to keep sats greater than 88% -Continue antibiotics per primary , recommend 10 days total of levaquin.  -Continue bronchodilators  -Continue guaifenesin  -Flutter valve/incentive spirometer  -Continue prednisone, taper -CXR prn -Pleural effusion is small and does not require any intervention at this time will watch -patient with good clinical improvement.  - PCCM will signoff   CARDIOVASCULAR A:  History of hypertension History of hyperlipidemia P:  -Continue amlodipine -Continue statin therapy  RENAL A:  Acute kidney injury P:  -Monitor intake output  -follow-up chemistry -Replace electrolytes per ICU protocol  GASTROINTESTINAL A:  No active issues P  Heart healthy diet with modified carb  HEMATOLOGICAL A:  No active issues P:   Transfuse if Hgb <7  heparin for DVT prophylaxis  INFECTIOUS A:  Leukocytosis possibly due to steroids P:  Follow culture Monitor fever curve Continue antibiotics  ENDOCRINE A:  Diabetes mellitus P:  -Follow blood sugars before meals and bedtime's  -Sliding scale insulin coverage -added levimir and meal coverage insulin   NEUROLOGIC A:  No active issues P:  RASS goal:0   Thank you for consulting [facility], we will signoff at this time.  Please feel free to contact us with any questions at [phone] (please enter 7-digits).  I have personally obtained a history, examined the patient, evaluated laboratory and imaging results, formulated the assessment and plan and placed orders.  Pulmonary Care Time devoted to patient care services described in this note is 30 minutes.   Vilinda Boehringer, MD [facility] Pager [phone] (please enter 7-digits) On Call Pager [phone] (please enter 7-digits)  Note: This note was prepared with Dragon dictation along with smaller phrase technology. Any transcriptional errors that result from this process are unintentional.

## 2015-07-16 NOTE — Progress Notes (Signed)
Pharmacy Antibiotic Note  Brandi Hoover is a 75 y.o. female admitted on 07/14/2015 with pneumonia.  Pharmacy has been consulted for Levaquin dosing.  Plan: Continue Levaquin 750 mg IV q 48 hours for total of 10 day course.  Height: 5\' 6"  (167.6 cm) Weight: 274 lb 14.6 oz (124.7 kg) IBW/kg (Calculated) : 59.3  Temp (24hrs), Avg:97.8 F (36.6 C), Min:97.8 F (36.6 C), Max:97.8 F (36.6 C)   Recent Labs Lab 07/14/15 0057 07/15/15 0447  WBC 13.9* 14.7*  CREATININE 1.38* 1.52*    Estimated Creatinine Clearance: 42.5 mL/min (by C-G formula based on Cr of 1.52).    Allergies  Allergen Reactions  . Azithromycin Itching  . Gabapentin Other (See Comments)    Bad dreams, insomnia  . Omeprazole Other (See Comments)    Reaction: unknown  . Ramipril Other (See Comments)    Reaction: unknown    Antimicrobials this admission: Levaquin 6/5  >>     Dose adjustments this admission:   Microbiology results: 6/5 BCx: NGTD x 2 6/5 MRSA PCR: negative  6/5 CXR: R base atelectasis vs. infiltrate  Thank you for allowing pharmacy to be a part of this patient's care.  Simpson,Michael L 07/16/2015 8:32 PM

## 2015-07-16 NOTE — Progress Notes (Signed)
Inpatient Diabetes Program Recommendations  AACE/ADA: New Consensus Statement on Inpatient Glycemic Control (2015)  Target Ranges:  Prepandial:   less than 140 mg/dL      Peak postprandial:   less than 180 mg/dL (1-2 hours)      Critically ill patients:  140 - 180 mg/dL   Lab Results  Component Value Date   GLUCAP 208* 07/16/2015   HGBA1C 7.8* 07/14/2015    Review of Glycemic ControlResults for Brandi Hoover, Brandi Hoover (MRN [id number]) as of 07/16/2015 09:00  Ref. Range 07/15/2015 07:28 07/15/2015 11:22 07/15/2015 16:21 07/15/2015 22:18 07/16/2015 07:34  Glucose-Capillary Latest Ref Range: 65-99 mg/dL 291 (H) 215 (H) 295 (H) 239 (H) 208 (H)   Inpatient Diabetes Program Recommendations:   Note that steroids continue to be tapered.  Consider increasing Levemir to 20 units for the next 3 days while on steroids.  Once steroids stopped, Levemir can also be d/c'd.  Thanks, Adah Perl, RN, BC-ADM Inpatient Diabetes Coordinator Pager [phone] (8a-5p)

## 2015-07-16 NOTE — Clinical Documentation Improvement (Signed)
Internal Medicine at [facility]  Please document query responses in the progress notes and discharge summary, not on the CDI BPA in [facility]. Please do not deactivate queries without responding to them. Thank you!  "AKI" is documented in the Critical Care consult note dated 07/14/15.  Admission BUN/Cr 24/1.38.  Repeat BUN/CR 36/1.52.  No historical labs are available in [facility].  Patient is a known diabetic with an HgbA1c this admission of 7.8.  Please clarify is the diagnosis is applicable to this admission:  - the condition exists and additional supportive data is documented in the medical record  - the condition does not exist and the documentation has been amended  - unable to clinically determine   Please exercise your independent, professional judgment when responding. A specific answer is not anticipated or expected.   Thank You, Erling Conte  RN BSN CCDS [phone] Health Information Management [place]

## 2015-07-16 NOTE — Progress Notes (Signed)
[place] at [place] NAME: Brandi Hoover    MR#:  [id number]  DATE OF BIRTH:  11/23/1940  SUBJECTIVE:  CHIEF COMPLAINT:   Chief Complaint  Patient presents with  . Respiratory Distress   - Feels like her food is stuck in the throat and moving up and down, had similar episode with breakfast as well this morning - Other than that, improving, up in the chair today  REVIEW OF SYSTEMS:  Review of Systems  Constitutional: Positive for malaise/fatigue. Negative for fever and chills.  HENT: Negative for ear discharge, ear pain and nosebleeds.        Swallowing trouble  Eyes: Negative for blurred vision and double vision.  Respiratory: Positive for shortness of breath. Negative for cough and wheezing.   Cardiovascular: Negative for chest pain, palpitations and leg swelling.  Gastrointestinal: Negative for nausea, vomiting, abdominal pain, diarrhea and constipation.  Genitourinary: Negative for dysuria and urgency.  Musculoskeletal: Negative for myalgias and neck pain.  Neurological: Negative for dizziness, speech change, focal weakness, seizures and headaches.  Endo/Heme/Allergies: Does not bruise/bleed easily.  Psychiatric/Behavioral: Negative for depression.    DRUG ALLERGIES:   Allergies  Allergen Reactions  . Azithromycin Itching  . Gabapentin Other (See Comments)    Bad dreams, insomnia  . Omeprazole Other (See Comments)    Reaction: unknown  . Ramipril Other (See Comments)    Reaction: unknown    VITALS:  Blood pressure 136/58, pulse 82, temperature 97.8 F (36.6 C), temperature source Oral, resp. rate 19, height 5\' 6"  (1.676 m), weight 124.7 kg (274 lb 14.6 oz), SpO2 94 %.  PHYSICAL EXAMINATION:  Physical Exam  GENERAL:  75 y.o.-year-old patient Sitting in the chair with no acute distress.  EYES: Pupils equal, round, reactive to light and accommodation. No scleral icterus. Extraocular muscles intact.  HEENT: Head  atraumatic, normocephalic. Oropharynx and nasopharynx clear.  NECK:  Supple, no jugular venous distention. No thyroid enlargement, no tenderness.  LUNGS: Improved wheezing bilaterally, no  rales,rhonchi or crepitation. No use of accessory muscles of respiration. Bibasilar rhonchi noted CARDIOVASCULAR: S1, S2 normal. No murmurs, rubs, or gallops.  ABDOMEN: Soft, nontender, nondistended. Bowel sounds present. No organomegaly or mass.  EXTREMITIES: No pedal edema, cyanosis, or clubbing.  NEUROLOGIC: Cranial nerves II through XII are intact. Muscle strength 5/5 in all extremities. Sensation intact. Gait not checked.  PSYCHIATRIC: The patient is alert and oriented x 3.  SKIN: No obvious rash, lesion, or ulcer.    LABORATORY PANEL:   CBC  Recent Labs Lab 07/15/15 0447  WBC 14.7*  HGB 10.0*  HCT 30.2*  PLT 398   ------------------------------------------------------------------------------------------------------------------  Chemistries   Recent Labs Lab 07/14/15 0057 07/15/15 0447  NA 136 137  K 4.1 3.7  CL 102 99*  CO2 24 29  GLUCOSE 242* 349*  BUN 24* 36*  CREATININE 1.38* 1.52*  CALCIUM 8.8* 8.8*  AST 15  --   ALT 17  --   ALKPHOS 123  --   BILITOT 0.6  --    ------------------------------------------------------------------------------------------------------------------  Cardiac Enzymes  Recent Labs Lab 07/14/15 0057  TROPONINI <0.03   ------------------------------------------------------------------------------------------------------------------  RADIOLOGY:  No results found.  EKG:   Orders placed or performed during the hospital encounter of 07/14/15  . EKG 12-Lead  . EKG 12-Lead  . ED EKG  . ED EKG    ASSESSMENT AND PLAN:   74y/oF past medical history significant for hypertension, diabetes mellitus, asthma presents to the  hospital secondary to hypoxia and dyspnea  #1 acute hypoxic respiratory failure on admission-not on any home oxygen. Off  nonrebreather mask and currently on 3 L oxygen. -Chest x-ray with right lung base infiltrate. F/u -On prednisone taper for reactive airway disease and also on antibiotics with Levaquin -Appreciate pulmonary consult -Continue inhalers  #2 diabetes mellitus -hold metformin and glipizide A1c is 7.8 -Sugars elevated due to being on steroids. On Lantus, pre-meal insulin and sliding scale insulin  #3 hypertension-on Norvasc, benazepril, hydrochlorothiazide  #4 Dysphagia- no prior history Speech consult, change to soft diet Cough meds added F/u CXR in AM  #5 acute renal failure on CKD- baseline creatinine around 1.2. Will order gentle hydration. Monitor. Avoid nephrotoxins  Physical therapy consult requested   All the records are reviewed and case discussed with Care Management/Social Workerr. Management plans discussed with the patient, family and they are in agreement.  CODE STATUS: Full Code  TOTAL TIME TAKING CARE OF THIS PATIENT: 37 minutes.   POSSIBLE D/C IN 2 DAYS, DEPENDING ON CLINICAL CONDITION.   Gladstone Lighter M.D on 07/16/2015 at 2:40 PM  Between 7am to 6pm - Pager - [phone]  After 6pm go to [email or web address] - password [email or web address] [facility]  [facility]  Office  [phone]  CC: Primary care physician; No primary care provider on file.

## 2015-07-16 NOTE — Progress Notes (Signed)
Pt oob and up to chair with 1 person assist

## 2015-07-17 ENCOUNTER — Inpatient Hospital Stay: Payer: Medicare Other

## 2015-07-17 LAB — BASIC METABOLIC PANEL
Anion gap: 8 (ref 5–15)
BUN: 34 mg/dL — AB (ref 6–20)
CO2: 29 mmol/L (ref 22–32)
CREATININE: 1.3 mg/dL — AB (ref 0.44–1.00)
Calcium: 9.3 mg/dL (ref 8.9–10.3)
Chloride: 101 mmol/L (ref 101–111)
GFR, EST AFRICAN AMERICAN: 45 mL/min — AB (ref >60)
GFR, EST NON AFRICAN AMERICAN: 39 mL/min — AB (ref >60)
Glucose, Bld: 127 mg/dL — ABNORMAL HIGH (ref 65–99)
POTASSIUM: 3.8 mmol/L (ref 3.5–5.1)
SODIUM: 138 mmol/L (ref 135–145)

## 2015-07-17 LAB — GLUCOSE, CAPILLARY
Glucose-Capillary: 140 mg/dL — ABNORMAL HIGH (ref 65–99)
Glucose-Capillary: 255 mg/dL — ABNORMAL HIGH (ref 65–99)
Glucose-Capillary: 293 mg/dL — ABNORMAL HIGH (ref 65–99)
Glucose-Capillary: 182 mg/dL — ABNORMAL HIGH (ref 65–99)

## 2015-07-17 MED ORDER — PREDNISONE 20 MG PO TABS
20.0000 mg | ORAL_TABLET | Freq: Once | ORAL | Status: AC
  Administered 2015-07-17: 20 mg via ORAL

## 2015-07-17 MED ORDER — FLEET ENEMA 7-19 GM/118ML RE ENEM
1.0000 | ENEMA | Freq: Once | RECTAL | Status: AC
  Administered 2015-07-17: 1 via RECTAL

## 2015-07-17 MED ORDER — IPRATROPIUM-ALBUTEROL 0.5-2.5 (3) MG/3ML IN SOLN
3.0000 mL | Freq: Four times a day (QID) | RESPIRATORY_TRACT | Status: DC
  Administered 2015-07-17 – 2015-07-18 (×3): 3 mL via RESPIRATORY_TRACT
  Filled 2015-07-17 (×4): qty 3

## 2015-07-17 NOTE — Progress Notes (Signed)
Pharmacy Antibiotic Note  Brandi Hoover is a 75 y.o. female admitted on 07/14/2015 with pneumonia.  Pharmacy has been consulted for Levaquin dosing.  Plan: Continue Levaquin 750 mg PO q48hrs for total of 10 day course.  Height: 5\' 6"  (167.6 cm) Weight: 263 lb 0.1 oz (119.3 kg) IBW/kg (Calculated) : 59.3  Temp (24hrs), Avg:98 F (36.7 C), Min:98 F (36.7 C), Max:98 F (36.7 C)   Recent Labs Lab 07/14/15 0057 07/15/15 0447 07/17/15 0514  WBC 13.9* 14.7*  --   CREATININE 1.38* 1.52* 1.30*    Estimated Creatinine Clearance: 48.4 mL/min (by C-G formula based on Cr of 1.3).    Allergies  Allergen Reactions  . Azithromycin Itching  . Gabapentin Other (See Comments)    Bad dreams, insomnia  . Omeprazole Other (See Comments)    Reaction: unknown  . Ramipril Other (See Comments)    Reaction: unknown    Antimicrobials this admission: Levofloxacin 6/5  >>     Dose adjustments this admission:   Microbiology results: 6/5 BCx: NGTD x 2 6/5 MRSA PCR: negative   Thank you for allowing pharmacy to be a part of this patient's care.  Rodgerick Gilliand L 07/17/2015 2:11 PM

## 2015-07-17 NOTE — Progress Notes (Signed)
Inpatient Diabetes Program Recommendations  AACE/ADA: New Consensus Statement on Inpatient Glycemic Control (2015)  Target Ranges:  Prepandial:   less than 140 mg/dL      Peak postprandial:   less than 180 mg/dL (1-2 hours)      Critically ill patients:  140 - 180 mg/dL   Lab Results  Component Value Date   GLUCAP 140* 07/17/2015   HGBA1C 7.8* 07/14/2015    Review of Glycemic Control   Results for CLANCEY, ROUTON (MRN [id number]) as of 07/17/2015 08:13  Ref. Range 07/16/2015 07:34 07/16/2015 11:36 07/16/2015 16:13 07/16/2015 21:28 07/17/2015 07:36  Glucose-Capillary Latest Ref Range: 65-99 mg/dL 208 (H) 209 (H) 220 (H) 121 (H) 140 (H)    Inpatient Diabetes Program Recommendations:   Note that steroids continue to be tapered. Once steroids stopped, Lantus can also be d/c'd.  Gentry Fitz, RN, BA, MHA, CDE Diabetes Coordinator Inpatient Diabetes Program  [phone] (Team Pager) [phone] ([place]) 07/17/2015 8:14 AM

## 2015-07-17 NOTE — Progress Notes (Signed)
Pastoral Care and prayer provided as requested by patient.

## 2015-07-17 NOTE — Progress Notes (Signed)
Patient transferred from CCU. Madlyn Frankel, RN

## 2015-07-17 NOTE — Progress Notes (Signed)
1700 Report called to 1C Event organiser.Kermit Balo day. Up in chair most of the day. States she is swallowing without difficulty.

## 2015-07-17 NOTE — Evaluation (Addendum)
Clinical/Bedside Swallow Evaluation Patient Details  Name: ELENAH MANWARING MRN: [id number] Date of Birth: 15-Oct-1940  Today's Date: 07/17/2015 Time: SLP Start Time (ACUTE ONLY): 0820 SLP Stop Time (ACUTE ONLY): 0920 SLP Time Calculation (min) (ACUTE ONLY): 60 min  Past Medical History:  Past Medical History  Diagnosis Date  . Diabetes mellitus without complication ([place])   . Hypertension   . High cholesterol   . Asthma    Past Surgical History:  Past Surgical History  Procedure Laterality Date  . Right hip replacement     HPI:  patient presents to the emergency department via EMS with severe shortness of breath. She has a h/o asthma, COPD, elevated cholesterol, HTN, and DM. She received some breathing treatments en route but continued to be dyspneic and hypoxic eventually requiring nonrebreather mask. Upon presentation the patient was wheezing with increased work of breathing. She was given intravenous steroids and levofloxacin. Chest x-ray was read as small right pleural effusion as well as potential infiltrate versus atelectasis in the right lower lobe. Emergency department staff called for admission by the time I arrived to the room the patient was no longer in extremis. She was able to speak in complete sentences without increased work of breathing. However, she remained very hypoxic requiring nonrebreather mask to maintain oxygen saturations greater than 92%. Her persistent hypoxia and potential pneumonia prompted the emergency department staff to call for admission. Currently, she is alert, sitting in chair feeding self her meal. She verbally responds w/ no report of swallowing deficits. Pt has a recent CXR revealing Stable patchy bilateral lower lobe opacities, right greater than left suggesting multilobar pneumonia.    Assessment / Plan / Recommendation Clinical Impression  Pt appears at her baseline w/ oral intake and swallowing per her report. She appears at reduced risk for aspiration  from an oropharyngeal standpoint following general aspiration precautions and taking her time during meals/po intake sec. to her reduced pulmonary status baseline. Pt did not c/o difficulty w/ any particular foods even when asked about breads or meats. Gently educated pt on the nature of such foods and their inherent dysmotility throught the Esophagus. Suggested general strategies of moistening these foods and alternating w/ moist foods or liquids. Pt stated understanding. Due to pt denying any deficits and appearing to tolerate her current soft foods diet, recommend continue w/ a chopped meats diet of soft, cooked foods w/ general aspiration precautions. NSG/MD to reconsult if any further needs for assessment or education. NSG updated and agreed.     Aspiration Risk   (reduced)    Diet Recommendation  Dysphagia 3(chopped meats), thin liquids; general aspiration precautions. Monitor for reflux and need for general precautions  Medication Administration: Whole meds with liquid (but in puree if necessary)    Other  Recommendations Recommended Consults:  (dietician) Oral Care Recommendations: Oral care BID;Staff/trained caregiver to provide oral care   Follow up Recommendations  None Except to f/u w/ a GI consult if desired to further assess potential Reflux or Esophageal dysmotility.   Frequency and Duration            Prognosis Prognosis for Safe Diet Advancement: Good      Swallow Study   General Date of Onset: 07/14/15 HPI: patient presents to the emergency department via EMS with severe shortness of breath. She has a h/o asthma, COPD, elevated cholesterol, HTN, and DM. She received some breathing treatments en route but continued to be dyspneic and hypoxic eventually requiring nonrebreather mask. Upon presentation the  patient was wheezing with increased work of breathing. She was given intravenous steroids and levofloxacin. Chest x-ray was read as small right pleural effusion as well as  potential infiltrate versus atelectasis in the right lower lobe. Emergency department staff called for admission by the time I arrived to the room the patient was no longer in extremis. She was able to speak in complete sentences without increased work of breathing. However, she remained very hypoxic requiring nonrebreather mask to maintain oxygen saturations greater than 92%. Her persistent hypoxia and potential pneumonia prompted the emergency department staff to call for admission. Currently, she is alert, sitting in chair feeding self her meal. She verbally responds w/ no report of swallowing deficits. Pt has a recent CXR revealing Stable patchy bilateral lower lobe opacities, right greater than left suggesting multilobar pneumonia.  Type of Study: Bedside Swallow Evaluation Previous Swallow Assessment: none Diet Prior to this Study: Regular;Thin liquids (pt stated she likes breads) Temperature Spikes Noted: No (wbc 14.7 yesterday) Respiratory Status: Nasal cannula (3 liters) History of Recent Intubation: No Behavior/Cognition: Alert;Cooperative;Pleasant mood Oral Cavity Assessment: Within Functional Limits Oral Care Completed by SLP: Recent completion by staff Oral Cavity - Dentition: Adequate natural dentition Vision: Functional for self-feeding Self-Feeding Abilities: Able to feed self;Needs set up (min) Patient Positioning: Upright in chair Baseline Vocal Quality: Normal Volitional Cough: Strong Volitional Swallow: Able to elicit    Oral/Motor/Sensory Function Overall Oral Motor/Sensory Function: Within functional limits   Ice Chips Ice chips: Not tested   Thin Liquid Thin Liquid: Within functional limits Presentation: Cup;Self Fed;Straw Other Comments: several ounces total of juice    Nectar Thick Nectar Thick Liquid: Not tested   Honey Thick Honey Thick Liquid: Not tested   Puree Puree: Within functional limits Presentation: Self Fed;Spoon (2 trials)   Solid   GO   Solid:  Within functional limits (grossly) Presentation: Spoon;Self Fed (6 trials) Other Comments: encouraged pt to take her time to not increased WOB d/t current respiratory status       Orinda Kenner, MS, CCC-SLP  Konica Stankowski 07/17/2015,2:25 PM

## 2015-07-17 NOTE — Progress Notes (Signed)
[place] at [place] NAME: Brandi Hoover    MR#:  [id number]  DATE OF BIRTH:  16-Aug-1940  SUBJECTIVE:  CHIEF COMPLAINT:   Chief Complaint  Patient presents with  . Respiratory Distress   - Appears slightly dyspneic today. On 4 L oxygen today. No trouble swallowing today. -Physical therapy recommended rehabilitation  REVIEW OF SYSTEMS:  Review of Systems  Constitutional: Positive for malaise/fatigue. Negative for fever and chills.  HENT: Negative for ear discharge, ear pain and nosebleeds.        Swallowing trouble  Eyes: Negative for blurred vision and double vision.  Respiratory: Positive for shortness of breath and wheezing. Negative for cough.   Cardiovascular: Negative for chest pain, palpitations and leg swelling.  Gastrointestinal: Negative for nausea, vomiting, abdominal pain, diarrhea and constipation.  Genitourinary: Negative for dysuria and urgency.  Musculoskeletal: Negative for myalgias and neck pain.  Neurological: Negative for dizziness, speech change, focal weakness, seizures and headaches.  Endo/Heme/Allergies: Does not bruise/bleed easily.  Psychiatric/Behavioral: Negative for depression.    DRUG ALLERGIES:   Allergies  Allergen Reactions  . Azithromycin Itching  . Gabapentin Other (See Comments)    Bad dreams, insomnia  . Omeprazole Other (See Comments)    Reaction: unknown  . Ramipril Other (See Comments)    Reaction: unknown    VITALS:  Blood pressure 136/58, pulse 88, temperature 98 F (36.7 C), temperature source Oral, resp. rate 23, height 5\' 6"  (1.676 m), weight 119.3 kg (263 lb 0.1 oz), SpO2 92 %.  PHYSICAL EXAMINATION:  Physical Exam  GENERAL:  75 y.o.-year-old patient Sitting in the chair with no acute distress.  EYES: Pupils equal, round, reactive to light and accommodation. No scleral icterus. Extraocular muscles intact.  HEENT: Head atraumatic, normocephalic. Oropharynx and nasopharynx  clear.  NECK:  Supple, no jugular venous distention. No thyroid enlargement, no tenderness.  LUNGS: Scattered wheezing bilaterally, no  rales,rhonchi or crepitation. No use of accessory muscles of respiration. Bibasilar rhonchi noted CARDIOVASCULAR: S1, S2 normal. No murmurs, rubs, or gallops.  ABDOMEN: Soft, nontender, nondistended. Bowel sounds present. No organomegaly or mass.  EXTREMITIES: No pedal edema, cyanosis, or clubbing.  NEUROLOGIC: Cranial nerves II through XII are intact. Muscle strength 5/5 in all extremities. Sensation intact. Gait not checked.  PSYCHIATRIC: The patient is alert and oriented x 3.  SKIN: No obvious rash, lesion, or ulcer.    LABORATORY PANEL:   CBC  Recent Labs Lab 07/15/15 0447  WBC 14.7*  HGB 10.0*  HCT 30.2*  PLT 398   ------------------------------------------------------------------------------------------------------------------  Chemistries   Recent Labs Lab 07/14/15 0057  07/17/15 0514  NA 136  < > 138  K 4.1  < > 3.8  CL 102  < > 101  CO2 24  < > 29  GLUCOSE 242*  < > 127*  BUN 24*  < > 34*  CREATININE 1.38*  < > 1.30*  CALCIUM 8.8*  < > 9.3  AST 15  --   --   ALT 17  --   --   ALKPHOS 123  --   --   BILITOT 0.6  --   --   < > = values in this interval not displayed. ------------------------------------------------------------------------------------------------------------------  Cardiac Enzymes  Recent Labs Lab 07/14/15 0057  TROPONINI <0.03   ------------------------------------------------------------------------------------------------------------------  RADIOLOGY:  Dg Chest 2 View  07/17/2015  CLINICAL DATA:  Pneumonia.  Inpatient.  Shortness of breath. EXAM: CHEST  2 VIEW COMPARISON:  07/14/2015  chest radiograph. FINDINGS: Stable cardiomediastinal silhouette with normal heart size. No pneumothorax. Stable trace right pleural effusion. Stable patchy bilateral lower lobe opacities, right greater than left. No  pulmonary edema. IMPRESSION: 1. Stable patchy bilateral lower lobe opacities, right greater than left, suspicious for multi lobar pneumonia. 2. Stable trace right pleural effusion. Electronically Signed   By: Ilona Sorrel M.D.   On: 07/17/2015 08:34    EKG:   Orders placed or performed during the hospital encounter of 07/14/15  . EKG 12-Lead  . EKG 12-Lead  . ED EKG  . ED EKG    ASSESSMENT AND PLAN:   74y/oF past medical history significant for hypertension, diabetes mellitus, asthma presents to the hospital secondary to hypoxia and dyspnea  #1 acute hypoxic respiratory failure on admission-not on any home oxygen. Off nonrebreather mask and currently on 3-4 L oxygen. -Chest x-ray with multilobar pneumonia today, right greater than left -On prednisone taper for reactive airway disease and also on antibiotics with Levaquin -If no improvement by tomorrow, broaden antibiotic coverage -Appreciate pulmonary consult -Continue inhalers  #2 diabetes mellitus -hold metformin and glipizide A1c is 7.8 -Sugars elevated due to being on steroids. On Lantus, pre-meal insulin and sliding scale insulin  #3 hypertension-on Norvasc, benazepril, hydrochlorothiazide  #4 Dysphagia- no prior history Speech consult, change to soft diet Cough meds added Chest x-ray with multilobar pneumonia  #5 acute renal failure on CKD- baseline creatinine around 1.2. Will order gentle hydration. Monitor. Avoid nephrotoxins Improving renal function  Physical therapy consult requested Recommended rehabilitation. Possible discharge tomorrow if stable.  All the records are reviewed and case discussed with Care Management/Social Workerr. Management plans discussed with the patient, family and they are in agreement.  CODE STATUS: Full Code  TOTAL TIME TAKING CARE OF THIS PATIENT: 37 minutes.   POSSIBLE D/C TOMORROW, DEPENDING ON CLINICAL CONDITION.   Gladstone Lighter M.D on 07/17/2015 at 2:32 PM  Between 7am  to 6pm - Pager - [phone]  After 6pm go to [email or web address] - password [email or web address] [facility]  [place] [facility]  Office  [phone]  CC: Primary care physician; No primary care provider on file.

## 2015-07-17 NOTE — Progress Notes (Signed)
Chaplain and Intern rounded the unit and provided a compassionate presence and spiritual support to the patient. The patient requested prayer and prayer was provided. The patient appeared happy that we stopped by.  Minerva Fester [phone]

## 2015-07-17 NOTE — Clinical Social Work Note (Signed)
Clinical Social Work Assessment  Patient Details  Name: PRICE PHILLABAUM MRN: [id number] Date of Birth: Oct 04, 1940  Date of referral:  07/17/15               Reason for consult:  Facility Placement                Permission sought to share information with:    Permission granted to share information::     Name::        Agency::     Relationship::     Contact Information:     Housing/Transportation Living arrangements for the past 2 months:  Single Family Home Source of Information:  Patient Patient Interpreter Needed:  None Criminal Activity/Legal Involvement Pertinent to Current Situation/Hospitalization:  No - Comment as needed Significant Relationships:  Adult Children Lives with:  Self Do you feel safe going back to the place where you live?  Yes Need for family participation in patient care:  Yes (Comment)  Care giving concerns:  Patient resides alone but her daughter is a good source of support.   Social Worker assessment / plan:  CSW informed by PT that they are recommending STR for patient. CSW spoke with patient this afternoon and she informed CSW that she is better than she was last admission. Patient stated that she went to [facility] for rehab in the past and she states she is not going back to rehab again. She states that going to a rehab facility is boring and that there is not enough interaction and they don't work with the individual enough. She states she prefers to go to home with home health through [facility] as she has had them before. Patient states her daughter does not work and she can stay with her and assist her if needed.  Employment status:  Retired Forensic scientist:  Medicare PT Recommendations:  [place] / Referral to community resources:     Patient/Family's Response to care:  Patient expressed appreciation for CSW assistance.  Patient/Family's Understanding of and Emotional Response to Diagnosis, Current Treatment, and  Prognosis:  Patient believes that she is physically in better condition than she has been in the past.   Emotional Assessment Appearance:  Appears stated age Attitude/Demeanor/Rapport:   (pleasant and cooperative) Affect (typically observed):  Calm, Hopeful Orientation:  Oriented to Self, Oriented to Place, Oriented to  Time, Oriented to Situation Alcohol / Substance use:  Not Applicable Psych involvement (Current and /or in the community):  No (Comment)  Discharge Needs  Concerns to be addressed:  Care Coordination Readmission within the last 30 days:  No Current discharge risk:  None Barriers to Discharge:  No Barriers Identified   Shela Leff, LCSW 07/17/2015, 3:06 PM

## 2015-07-18 ENCOUNTER — Encounter
Admission: RE | Admit: 2015-07-18 | Discharge: 2015-07-18 | Disposition: A | Discharge status: Home / Self Care | Payer: Medicare Other | Source: Ambulatory Visit | Attending: Internal Medicine | Admitting: Internal Medicine

## 2015-07-18 DIAGNOSIS — E119 Type 2 diabetes mellitus without complications: Secondary | ICD-10-CM | POA: Insufficient documentation

## 2015-07-18 LAB — GLUCOSE, CAPILLARY
Glucose-Capillary: 175 mg/dL — ABNORMAL HIGH (ref 65–99)
Glucose-Capillary: 161 mg/dL — ABNORMAL HIGH (ref 65–99)
Glucose-Capillary: 225 mg/dL — ABNORMAL HIGH (ref 65–99)

## 2015-07-18 MED ORDER — LEVOFLOXACIN 500 MG PO TABS: 500.0000 mg | ORAL_TABLET | ORAL | Status: DC

## 2015-07-18 MED ORDER — POLYETHYLENE GLYCOL 3350 17 G PO PACK: 17.0000 g | PACK | Freq: Every day | ORAL | Status: AC

## 2015-07-18 MED ORDER — POLYETHYLENE GLYCOL 3350 17 G PO PACK
17.0000 g | PACK | Freq: Two times a day (BID) | ORAL | Status: DC
  Administered 2015-07-18: 08:00:00 17 g via ORAL
  Filled 2015-07-18: qty 1

## 2015-07-18 MED ORDER — IPRATROPIUM-ALBUTEROL 0.5-2.5 (3) MG/3ML IN SOLN: 3.0000 mL | Freq: Four times a day (QID) | RESPIRATORY_TRACT | Status: AC | PRN

## 2015-07-18 MED ORDER — CEPASTAT 14.5 MG MT LOZG
1.0000 | LOZENGE | OROMUCOSAL | Status: DC | PRN
  Filled 2015-07-18: qty 9

## 2015-07-18 MED ORDER — GUAIFENESIN ER 600 MG PO TB12
600.0000 mg | ORAL_TABLET | Freq: Two times a day (BID) | ORAL | Status: AC
Start: 2015-07-18 — End: ?

## 2015-07-18 MED ORDER — GUAIFENESIN-CODEINE 100-10 MG/5ML PO SOLN: 5.0000 mL | Freq: Every evening | ORAL | Status: AC | PRN

## 2015-07-18 MED ORDER — FLEET ENEMA 7-19 GM/118ML RE ENEM: 1.0000 | ENEMA | Freq: Every day | RECTAL | Status: DC | PRN

## 2015-07-18 MED ORDER — CEPASTAT 14.5 MG MT LOZG: 1.0000 | LOZENGE | OROMUCOSAL | Status: AC | PRN

## 2015-07-18 MED ORDER — PREDNISONE 10 MG (21) PO TBPK: 10.0000 mg | ORAL_TABLET | Freq: Every day | ORAL | Status: DC

## 2015-07-18 MED ORDER — DOCUSATE SODIUM 100 MG PO CAPS: 100.0000 mg | ORAL_CAPSULE | Freq: Two times a day (BID) | ORAL | Status: AC

## 2015-07-18 NOTE — Progress Notes (Signed)
Pharmacy Antibiotic Note  Brandi Hoover is a 75 y.o. female admitted on 07/14/2015 with pneumonia.  Pharmacy has been consulted for Levaquin dosing.  Plan: Continue Levaquin 750 mg PO q48hrs for total of 10 day course.  Will continue to follow renal function and adjust for CrCl > 42ml/min  Height: 5' 6.5" (168.9 cm) Weight: 264 lb 4.8 oz (119.886 kg) IBW/kg (Calculated) : 60.45  Temp (24hrs), Avg:98 F (36.7 C), Min:97.6 F (36.4 C), Max:98.4 F (36.9 C)   Recent Labs Lab 07/14/15 0057 07/15/15 0447 07/17/15 0514  WBC 13.9* 14.7*  --   CREATININE 1.38* 1.52* 1.30*    Estimated Creatinine Clearance: 49 mL/min (by C-G formula based on Cr of 1.3).    Allergies  Allergen Reactions  . Azithromycin Itching  . Gabapentin Other (See Comments)    Bad dreams, insomnia  . Omeprazole Other (See Comments)    Reaction: unknown  . Ramipril Other (See Comments)    Reaction: unknown    Antimicrobials this admission: Levofloxacin 6/5  >>     Dose adjustments this admission:   Microbiology results: 6/5 BCx: NGTD x 2 6/5 MRSA PCR: negative   Thank you for allowing pharmacy to be a part of this patient's care.  Aleesha Ringstad C 07/18/2015 11:21 AM

## 2015-07-18 NOTE — Progress Notes (Signed)
Physical Therapy Treatment Patient Details Name: Brandi Hoover MRN: [id number] DOB: 1940/08/04 Today's Date: 07/18/2015    History of Present Illness 75 yo F presented to ED for 1wk long of cough/congestion adn increased SOB found to have respiratory failure with hypoxia.     PT Comments    Pt gradually progressing towards functional goals, however, is still limited by fatigue with minimal activity. She requires min guard for transfers and limited ambulation up to 50 ft. By that time she needs to sit immediately. Navigating steps was highly difficult and caused her to fatigue and need to sit as soon as she was down, requiring mod A. Pt was again extensively educated on the benefit of going to STR after hospital discharge, as she will be required to do more for herself at home alone and doesn't have the endurance to do so at this time. Plan to progress mobility as tolerated.  Follow Up Recommendations  SNF     Equipment Recommendations  Rolling walker with 5" wheels;Other (comment)    Recommendations for Other Services       Precautions / Restrictions Precautions Precautions: Fall Restrictions Weight Bearing Restrictions: No    Mobility  Bed Mobility               General bed mobility comments: up in recliner, NT  Transfers Overall transfer level: Needs assistance Equipment used: Rolling walker (2 wheeled) Transfers: Sit to/from [facility] Sit to Stand: Min guard Stand pivot transfers: Min guard       General transfer comment: cues for hand placement with little follow through  Ambulation/Gait Ambulation/Gait assistance: Min guard (with chair follow) Ambulation Distance (Feet): 50 Feet Assistive device: Rolling walker (2 wheeled) Gait Pattern/deviations: Decreased stride length;Trunk flexed Gait velocity: reduced Gait velocity interpretation: Below normal speed for age/gender General Gait Details: Very slow gait. No LOB. Fatigues quickly and  becomes SOB. Cues for staying inside FWW, purse lip breathing.   Stairs Stairs: Yes Stairs assistance: Mod assist Stair Management: One rail Left Number of Stairs: 3 General stair comments: Difficulty lifting foot onto step, fatigues very quickly  Wheelchair Mobility    Modified Rankin (Stroke Patients Only)       Balance Overall balance assessment: Needs assistance Sitting-balance support: No upper extremity supported Sitting balance-Leahy Scale: Fair     Standing balance support: Bilateral upper extremity supported Standing balance-Leahy Scale: Fair Standing balance comment: unsteady without UE support                    Cognition Arousal/Alertness: Awake/alert Behavior During Therapy: WFL for tasks assessed/performed Overall Cognitive Status: Within Functional Limits for tasks assessed                      Exercises Other Exercises Other Exercises: Pt ambulated 50 ft, 40 ft, 30 ft with FWW and min guard with close chair follow. Tends to breath fast and shallow, desaturating to 87% on 3L . Requires seated extended therapeutic rest break between. Recovers to 91% with pursed lip breathing within 2-3 minutes. Cues for increased stride and continuous gait to facilitate energy conservation.  Other Exercises: Up/Down 3 steps with one rail with mod A. Cues for safe technique and pursed lip breathing.    General Comments General comments (skin integrity, edema, etc.): 3L O2, continues to breath fast and shallow, fatigues quickly with minimal activity      Pertinent Vitals/Pain Pain Assessment: No/denies pain    Home Living  Prior Function            PT Goals (current goals can now be found in the care plan section) Acute Rehab PT Goals Patient Stated Goal: to get home PT Goal Formulation: With patient Time For Goal Achievement: 07/29/15 Potential to Achieve Goals: Fair Progress towards PT goals: Progressing toward  goals    Frequency  Min 2X/week    PT Plan Current plan remains appropriate    Co-evaluation             End of Session Equipment Utilized During Treatment: Oxygen;Gait belt Activity Tolerance: Patient tolerated treatment well;Patient limited by fatigue Patient left: in chair;with call bell/phone within reach     Time: 0850-0916 PT Time Calculation (min) (ACUTE ONLY): 26 min  Charges:  $Gait Training: 8-22 mins $Therapeutic Activity: 8-22 mins                    G Codes:      Neoma Laming, PT, DPT  07/18/2015, 10:02 AM [phone]

## 2015-07-18 NOTE — Care Management Important Message (Signed)
Important Message  Patient Details  Name: Brandi Hoover MRN: [id number] Date of Birth: 1940-04-14   Medicare Important Message Given:  Yes    Shelbie Ammons, RN 07/18/2015, 8:20 AM

## 2015-07-18 NOTE — Clinical Social Work Note (Signed)
CSW received in report that pt will now go to SNF and CSW was to speak with pt's daughter. CSW spoke with pt's daughter and she is agreeable to a SNF search. CSW initiated a SNF search and will follow up with bed offers. CSW will continue to follow.   Darden Dates, MSW, LCSW  Clinical Social Worker  [phone]

## 2015-07-18 NOTE — Progress Notes (Signed)
Inpatient Diabetes Program Recommendations  AACE/ADA: New Consensus Statement on Inpatient Glycemic Control (2015)  Target Ranges:  Prepandial:   less than 140 mg/dL      Peak postprandial:   less than 180 mg/dL (1-2 hours)      Critically ill patients:  140 - 180 mg/dL   Lab Results  Component Value Date   GLUCAP 161* 07/18/2015   HGBA1C 7.8* 07/14/2015   Review of Glycemic Control  Results for Brandi Hoover, Brandi Hoover (MRN [id number]) as of 07/18/2015 12:10  Ref. Range 07/17/2015 12:20 07/17/2015 16:03 07/17/2015 21:12 07/18/2015 07:59 07/18/2015 11:04  Glucose-Capillary Latest Ref Range: 65-99 mg/dL 255 (H) 293 (H) 182 (H) 175 (H) 161 (H)    Diabetes history: Type 2  Outpatient Diabetes medications: Metformin 1500mg  qam, 1000mg  qpm, Glipizide 10mg  qam Current orders for Inpatient glycemic control: Lantus 12 units qhs, Novolog 0-20 units tid, Novolog 0-5 units qhs, Novolog 3 units tid with meals  Inpatient Diabetes Program Recommendations:CBG currently ideal. As steroids are tapered, please decrease insulin as needed.  Gentry Fitz, RN, BA, MHA, CDE Diabetes Coordinator Inpatient Diabetes Program  [phone] (Team Pager) [phone] ([place]) 07/18/2015 12:12 PM

## 2015-07-18 NOTE — NC FL2 (Signed)
[place] LEVEL OF CARE SCREENING TOOL     IDENTIFICATION  Patient Name: Brandi Hoover Birthdate: [birth year] Sex: female Admission Date (Current Location): 07/14/2015  [place] and [place] Number:  Engineering geologist and Address:  [facility], [address], [place], [place] [address]      Provider Number: [id number]  Attending Physician Name and Address:  Gladstone Lighter, MD  Relative Name and Phone Number:       Current Level of Care: Hospital Recommended Level of Care: [place] Prior Approval Number:    Date Approved/Denied:   PASRR Number: [id number]  Discharge Plan: SNF    Current Diagnoses: Patient Active Problem List   Diagnosis Date Noted  . Respiratory failure with hypoxia ([place]) 07/14/2015  . Pressure ulcer 07/14/2015    Orientation RESPIRATION BLADDER Height & Weight     Self, Time, Situation, Place  Normal Continent Weight: 264 lb 4.8 oz (119.886 kg) Height:  5' 6.5" (168.9 cm)  BEHAVIORAL SYMPTOMS/MOOD NEUROLOGICAL BOWEL NUTRITION STATUS      Continent Diet (Dys 3, Thin Liquids)  AMBULATORY STATUS COMMUNICATION OF NEEDS Skin   Limited Assist Verbally Normal                       Personal Care Assistance Level of Assistance  Bathing, Feeding, Dressing Bathing Assistance: Limited assistance Feeding assistance: Independent Dressing Assistance: Limited assistance     Functional Limitations Info  Sight, Hearing, Speech Sight Info: Adequate Hearing Info: Adequate Speech Info: Adequate    SPECIAL CARE FACTORS FREQUENCY  PT (By licensed PT)     PT Frequency: 5              Contractures Contractures Info: Not present    Additional Factors Info  Code Status, Allergies Code Status Info: Full Code Allergies Info: Allergies: Azithromycin, Gabapentin, Omeprazole, Ramipril           Current Medications (07/18/2015):  This is the current hospital active medication list Current  Facility-Administered Medications  Medication Dose Route Frequency Provider Last Rate Last Dose  . acetaminophen (TYLENOL) tablet 650 mg  650 mg Oral Q6H PRN Harrie Foreman, MD       Or  . acetaminophen (TYLENOL) suppository 650 mg  650 mg Rectal Q6H PRN Harrie Foreman, MD      . amLODipine (NORVASC) tablet 10 mg  10 mg Oral Daily Harrie Foreman, MD   10 mg at 07/17/15 0941  . aspirin EC tablet 81 mg  81 mg Oral Daily Harrie Foreman, MD   81 mg at 07/18/15 0820  . benazepril (LOTENSIN) tablet 20 mg  20 mg Oral Daily Harrie Foreman, MD   20 mg at 07/17/15 0941  . calcium-vitamin D (OSCAL WITH D) 500-200 MG-UNIT per tablet 1 tablet  1 tablet Oral QHS Harrie Foreman, MD   1 tablet at 07/17/15 2108  . docusate sodium (COLACE) capsule 100 mg  100 mg Oral BID Harrie Foreman, MD   100 mg at 07/18/15 0820  . enoxaparin (LOVENOX) injection 40 mg  40 mg Subcutaneous Q12H Vishal Mungal, MD   40 mg at 07/18/15 0826  . guaiFENesin (MUCINEX) 12 hr tablet 600 mg  600 mg Oral BID Gladstone Lighter, MD   600 mg at 07/18/15 0821  . guaiFENesin-codeine 100-10 MG/5ML solution 5 mL  5 mL Oral Q6H PRN Gladstone Lighter, MD      . hydrochlorothiazide (MICROZIDE) capsule  12.5 mg  12.5 mg Oral Daily Harrie Foreman, MD   12.5 mg at 07/17/15 0941  . insulin aspart (novoLOG) injection 0-20 Units  0-20 Units Subcutaneous TID WC Vilinda Boehringer, MD   4 Units at 07/18/15 0821  . insulin aspart (novoLOG) injection 0-5 Units  0-5 Units Subcutaneous QHS Vilinda Boehringer, MD   2 Units at 07/15/15 2334  . insulin aspart (novoLOG) injection 3 Units  3 Units Subcutaneous TID WC Vilinda Boehringer, MD   3 Units at 07/18/15 0821  . insulin glargine (LANTUS) injection 12 Units  12 Units Subcutaneous QHS Gladstone Lighter, MD   12 Units at 07/17/15 2118  . ipratropium-albuterol (DUONEB) 0.5-2.5 (3) MG/3ML nebulizer solution 3 mL  3 mL Nebulization QID Gladstone Lighter, MD   3 mL at 07/18/15 0755  . levofloxacin (LEVAQUIN)  tablet 750 mg  750 mg Oral Q48H Vishal Mungal, MD   750 mg at 07/18/15 0826  . multivitamin with minerals tablet 1 tablet  1 tablet Oral Daily Harrie Foreman, MD   1 tablet at 07/18/15 0820  . ondansetron (ZOFRAN) tablet 4 mg  4 mg Oral Q6H PRN Harrie Foreman, MD       Or  . ondansetron [facility]) injection 4 mg  4 mg Intravenous Q6H PRN Harrie Foreman, MD   4 mg at 07/15/15 [id number]  . polyethylene glycol (MIRALAX / GLYCOLAX) packet 17 g  17 g Oral BID Saundra Shelling, MD   17 g at 07/18/15 [id number]  . predniSONE (DELTASONE) tablet 10 mg  10 mg Oral Q breakfast Harrie Foreman, MD   10 mg at 07/18/15 0820  . promethazine (PHENERGAN) injection 12.5 mg  12.5 mg Intravenous Q6H PRN Anders Simmonds, MD   12.5 mg at 07/14/15 2202  . simvastatin (ZOCOR) tablet 20 mg  20 mg Oral QHS Harrie Foreman, MD   20 mg at 07/17/15 2108  . sodium chloride flush (NS) 0.9 % injection 3 mL  3 mL Intravenous Q12H Harrie Foreman, MD   3 mL at 07/18/15 0826  . sodium phosphate (FLEET) 7-19 GM/118ML enema 1 enema  1 enema Rectal Daily PRN Saundra Shelling, MD      . vitamin B-12 (CYANOCOBALAMIN) tablet 1,000 mcg  1,000 mcg Oral Daily Harrie Foreman, MD   1,000 mcg at 07/18/15 0820     Discharge Medications: Please see discharge summary for a list of discharge medications.  Relevant Imaging Results:  Relevant Lab Results:   Additional Information SSN:  [id number]  Darden Dates, LCSW

## 2015-07-18 NOTE — Plan of Care (Signed)
Problem: Bowel/Gastric: Goal: Will not experience complications related to bowel motility Outcome: Not Progressing Pt did not have a bm documented x 4 days. MD notified. Fleets enema given once.

## 2015-07-18 NOTE — Progress Notes (Signed)
Pt is alert and oriented x 4, denies pain, up to bsc with one person assist, ambulated with physical therapy, continues on 3 L oxygen, daughter at bedside to take belongings to [facility], report given to [place] at [facility], pt will be transported via EMS.

## 2015-07-18 NOTE — Clinical Social Work Note (Signed)
Per previous CSW, bed offers were extended and patient is to go to [facility] today. Kim at [facility] is aware and discharge information sent. Nurse to call report. Per previous CSW, patient to transport via EMS. Shela Leff MSW,LCSW [phone]

## 2015-07-18 NOTE — Clinical Social Work Placement (Signed)
   CLINICAL SOCIAL WORK PLACEMENT  NOTE  Date:  07/18/2015  Patient Details  Name: Brandi Hoover MRN: [id number] Date of Birth: 04-12-1940  Clinical Social Work is seeking post-discharge placement for this patient at the [place] level of care (*CSW will initial, date and re-position this form in  chart as items are completed):  Yes   Patient/family provided with [place] Work Department's list of facilities offering this level of care within the geographic area requested by the patient (or if unable, by the patient's family).  Yes   Patient/family informed of their freedom to choose among providers that offer the needed level of care, that participate in Medicare, Medicaid or managed care program needed by the patient, have an available bed and are willing to accept the patient.  Yes   Patient/family informed of [facility]'s ownership interest in [facility] and [facility], as well as of the fact that they are under no obligation to receive care at these facilities.  PASRR submitted to EDS on       PASRR number received on       Existing PASRR number confirmed on 07/18/15     FL2 transmitted to all facilities in geographic area requested by pt/family on 07/18/15     FL2 transmitted to all facilities within larger geographic area on       Patient informed that his/her managed care company has contracts with or will negotiate with certain facilities, including the following:        Yes   Patient/family informed of bed offers received.  Patient chooses bed at  [facility])     Physician recommends and patient chooses bed at  [facility])    Patient to be transferred to  [facility]) on  .  Patient to be transferred to facility by  (EMS)     Patient family notified on 07/18/15 of transfer.  Name of family member notified:  patient and daughter     PHYSICIAN       Additional Comment:     _______________________________________________ Shela Leff, LCSW 07/18/2015, 3:07 PM

## 2015-07-18 NOTE — Care Management (Signed)
Brandi Hoover worked with physical therapy this morning, till recommending skilled nursing facility. Spoke with Brandi Hoover at the bedside. Would like her daughter, Brandi Hoover, called to discuss options. [phone]). Will update Clinical Education officer, museum. Shelbie Ammons RN MSN CCM Care Management [phone]

## 2015-07-18 NOTE — Progress Notes (Signed)
SATURATION QUALIFICATIONS: (This note is used to comply with regulatory documentation for home oxygen)  Patient Saturations on Room Air at Rest = 87%  Patient Saturations on Room Air while Ambulating = 86%  Patient Saturations on 3 Liters of oxygen while Ambulating = 91%  Please briefly explain why patient needs home oxygen:

## 2015-07-18 NOTE — Discharge Summary (Signed)
Brandi Hoover at Brandi Hoover NAME: Brandi Hoover    MR#:  [id number]  DATE OF BIRTH:  Nov 25, 1940  DATE OF ADMISSION:  07/14/2015 ADMITTING PHYSICIAN: Harrie Foreman, MD  DATE OF DISCHARGE: 07/18/15  PRIMARY CARE PHYSICIAN: No primary care provider on file.    ADMISSION DIAGNOSIS:  Respiratory distress [R06.00] Hypoxia [R09.02] CAP (community acquired pneumonia) [J18.9] Asthma, mild intermittent, with acute exacerbation [J45.21]  DISCHARGE DIAGNOSIS:  Active Problems:   Respiratory failure with hypoxia (HCC)   Pressure ulcer   SECONDARY DIAGNOSIS:   Past Medical History  Diagnosis Date  . Diabetes mellitus without complication ([place])   . Hypertension   . High cholesterol   . Asthma     HOSPITAL COURSE:   75y/oF past medical history significant for hypertension, diabetes mellitus, asthma presents to the hospital secondary to hypoxia and dyspnea  #1 acute hypoxic respiratory failure on admission-not on any home oxygen. Off nonrebreather mask and currently on 3L oxygen. -Chest x-ray with multilobar pneumonia, right greater than left -On prednisone taper for reactive airway disease and also on antibiotics with Levaquin - Improving, continue incentive spirometry and flutter valve -Appreciate pulmonary consult -Continue inhalers - discharge to rehab today  #2 diabetes mellitus -restart home doses of metformin and glipizide A1c is 7.8  #3 hypertension-on Norvasc, benazepril, hydrochlorothiazide  #4 Dysphagia- no prior history, resolved, secondary to cough and phlegm Speech consult appreciated Cough meds added  #5 acute renal failure on CKD- baseline creatinine around 1.2. Improved with fluids. Avoid nephrotoxins  Physical therapy consult requested Recommended rehabilitation. Discharge today to [facility]  DISCHARGE CONDITIONS:   Stable  CONSULTS OBTAINED:   Pulmonary consult- Dr. Stevenson Clinch  DRUG ALLERGIES:   Allergies   Allergen Reactions  . Azithromycin Itching  . Gabapentin Other (See Comments)    Bad dreams, insomnia  . Omeprazole Other (See Comments)    Reaction: unknown  . Ramipril Other (See Comments)    Reaction: unknown    DISCHARGE MEDICATIONS:   Current Discharge Medication List    START taking these medications   Details  guaiFENesin (MUCINEX) 600 MG 12 hr tablet Take 1 tablet (600 mg total) by mouth 2 (two) times daily. Qty: 30 tablet, Refills: 0    ipratropium-albuterol (DUONEB) 0.5-2.5 (3) MG/3ML SOLN Take 3 mLs by nebulization every 6 (six) hours as needed (wheezing, shortness of breath). Qty: 360 mL, Refills: 0    levofloxacin (LEVAQUIN) 500 MG tablet Take 1 tablet (500 mg total) by mouth every other day. X 6 more days Qty: 6 tablet, Refills: 0    polyethylene glycol (MIRALAX / GLYCOLAX) packet Take 17 g by mouth daily. HOLD for > 2 bowel movements/day Qty: 14 each, Refills: 0    predniSONE (STERAPRED UNI-PAK 21 TAB) 10 MG (21) TBPK tablet Take 1 tablet (10 mg total) by mouth daily. 5 tabs PO x 1 day 4 tabs PO x 1 day 3 tabs PO x 1 day 2 tabs PO x 1 day 1 tab PO x 1 day and stop Qty: 15 tablet, Refills: 0      CONTINUE these medications which have CHANGED   Details  guaiFENesin-codeine 100-10 MG/5ML syrup Take 5 mLs by mouth at bedtime as needed for cough. Qty: 118 mL, Refills: 0      CONTINUE these medications which have NOT CHANGED   Details  albuterol (VENTOLIN HFA) 108 (90 Base) MCG/ACT inhaler Inhale 2 puffs into the lungs every 6 (six) hours as needed  for wheezing.     amLODipine (NORVASC) 10 MG tablet Take 10 mg by mouth daily.    aspirin EC 81 MG tablet Take 81 mg by mouth daily.    benazepril (LOTENSIN) 20 MG tablet Take 20 mg by mouth daily.    Calcium Carbonate-Vitamin D 600-400 MG-UNIT tablet Take 1 tablet by mouth at bedtime.    Fluticasone-Salmeterol (ADVAIR) 250-50 MCG/DOSE AEPB Inhale 1 puff into the lungs 2 (two) times daily.    glipiZIDE  (GLUCOTROL) 10 MG tablet Take 10 mg by mouth 2 (two) times daily before a meal.    hydrochlorothiazide (MICROZIDE) 12.5 MG capsule Take 12.5 mg by mouth daily.    metFORMIN (GLUCOPHAGE) 500 MG tablet Take 1,000-1,500 mg by mouth 2 (two) times daily with a meal. 1500 mg in the morning and 1000 mg in the evening    Multiple Vitamin (MULTIVITAMIN WITH MINERALS) TABS tablet Take 1 tablet by mouth daily.    simvastatin (ZOCOR) 20 MG tablet Take 20 mg by mouth at bedtime.    vitamin B-12 (CYANOCOBALAMIN) 1000 MCG tablet Take 1,000 mcg by mouth daily.         DISCHARGE INSTRUCTIONS:   1. PCP f/u in 1-2 weeks 2. Continue flutter valve and incentive spirometry as needed 3. Fingersticks twice a day as she is diabetic  If you experience worsening of your admission symptoms, develop shortness of breath, life threatening emergency, suicidal or homicidal thoughts you must seek medical attention immediately by calling 911 or calling your MD immediately  if symptoms less severe.  You Must read complete instructions/literature along with all the possible adverse reactions/side effects for all the Medicines you take and that have been prescribed to you. Take any new Medicines after you have completely understood and accept all the possible adverse reactions/side effects.   Please note  You were cared for by a hospitalist during your hospital stay. If you have any questions about your discharge medications or the care you received while you were in the hospital after you are discharged, you can call the unit and asked to speak with the hospitalist on call if the hospitalist that took care of you is not available. Once you are discharged, your primary care physician will handle any further medical issues. Please note that NO REFILLS for any discharge medications will be authorized once you are discharged, as it is imperative that you return to your primary care physician (or establish a relationship with a  primary care physician if you do not have one) for your aftercare needs so that they can reassess your need for medications and monitor your lab values.    Today   CHIEF COMPLAINT:   Chief Complaint  Patient presents with  . Respiratory Distress    VITAL SIGNS:  Blood pressure 130/49, pulse 90, temperature 98 F (36.7 C), temperature source Oral, resp. rate 24, height 5' 6.5" (1.689 m), weight 119.886 kg (264 lb 4.8 oz), SpO2 88 %.  I/O:   Intake/Output Summary (Last 24 hours) at 07/18/15 1243 Last data filed at 07/18/15 0900  Gross per 24 hour  Intake    240 ml  Output      0 ml  Net    240 ml    PHYSICAL EXAMINATION:   Physical Exam  GENERAL: 75 y.o.-year-old patient Sitting in the chair with no acute distress.  EYES: Pupils equal, round, reactive to light and accommodation. No scleral icterus. Extraocular muscles intact.  HEENT: Head atraumatic, normocephalic. Oropharynx and nasopharynx  clear.  NECK: Supple, no jugular venous distention. No thyroid enlargement, no tenderness.  LUNGS: Scattered wheezing bilaterally, no rales,rhonchi or crepitation. No use of accessory muscles of respiration. Bibasilar rhonchi noted CARDIOVASCULAR: S1, S2 normal. No murmurs, rubs, or gallops.  ABDOMEN: Soft, nontender, nondistended. Bowel sounds present. No organomegaly or mass.  EXTREMITIES: No pedal edema, cyanosis, or clubbing.  NEUROLOGIC: Cranial nerves II through XII are intact. Muscle strength 5/5 in all extremities. Sensation intact. Gait not checked.  PSYCHIATRIC: The patient is alert and oriented x 3.  SKIN: No obvious rash, lesion, or ulcer.   DATA REVIEW:   CBC  Recent Labs Lab 07/15/15 0447  WBC 14.7*  HGB 10.0*  HCT 30.2*  PLT 398    Chemistries   Recent Labs Lab 07/14/15 0057  07/17/15 0514  NA 136  < > 138  K 4.1  < > 3.8  CL 102  < > 101  CO2 24  < > 29  GLUCOSE 242*  < > 127*  BUN 24*  < > 34*  CREATININE 1.38*  < > 1.30*  CALCIUM  8.8*  < > 9.3  AST 15  --   --   ALT 17  --   --   ALKPHOS 123  --   --   BILITOT 0.6  --   --   < > = values in this interval not displayed.  Cardiac Enzymes  Recent Labs Lab 07/14/15 0057  TROPONINI <0.03    Microbiology Results  Results for orders placed or performed during the hospital encounter of 07/14/15  Culture, blood (routine x 2)     Status: None (Preliminary result)   Collection Time: 07/14/15  1:26 AM  Result Value Ref Range Status   Specimen Description BLOOD RIGHT ASSIST CONTROL  Final   Special Requests BOTTLES DRAWN AEROBIC AND ANAEROBIC [place]  Final   Culture NO GROWTH 4 DAYS  Final   Report Status PENDING  Incomplete  Culture, blood (routine x 2)     Status: None (Preliminary result)   Collection Time: 07/14/15  2:08 AM  Result Value Ref Range Status   Specimen Description BLOOD LEFT ARM  Final   Special Requests   Final    BOTTLES DRAWN AEROBIC AND ANAEROBIC [place]   Culture NO GROWTH 4 DAYS  Final   Report Status PENDING  Incomplete  MRSA PCR Screening     Status: None   Collection Time: 07/14/15  4:26 AM  Result Value Ref Range Status   MRSA by PCR NEGATIVE NEGATIVE Final    Comment:        The GeneXpert MRSA Assay (FDA approved for NASAL specimens only), is one component of a comprehensive MRSA colonization surveillance program. It is not intended to diagnose MRSA infection nor to guide or monitor treatment for MRSA infections.     RADIOLOGY:  Dg Chest 2 View  07/17/2015  CLINICAL DATA:  Pneumonia.  Inpatient.  Shortness of breath. EXAM: CHEST  2 VIEW COMPARISON:  07/14/2015 chest radiograph. FINDINGS: Stable cardiomediastinal silhouette with normal heart size. No pneumothorax. Stable trace right pleural effusion. Stable patchy bilateral lower lobe opacities, right greater than left. No pulmonary edema. IMPRESSION: 1. Stable patchy bilateral lower lobe opacities, right greater than left, suspicious for multi lobar pneumonia.  2. Stable trace right pleural effusion. Electronically Signed   By: Ilona Sorrel M.D.   On: 07/17/2015 08:34    EKG:   Orders placed or performed during the hospital encounter of  07/14/15  . EKG 12-Lead  . EKG 12-Lead  . ED EKG  . ED EKG      Management plans discussed with the patient, family and they are in agreement.  CODE STATUS:     Code Status Orders        Start     Ordered   07/14/15 0424  Full code   Continuous     07/14/15 0423    Code Status History    Date Active Date Inactive Code Status Order ID Comments User Context   This patient has a current code status but no historical code status.      TOTAL TIME TAKING CARE OF THIS PATIENT: 37 minutes.    Gladstone Lighter M.D on 07/18/2015 at 12:43 PM  Between 7am to 6pm - Pager - [phone]  After 6pm go to [email or web address] - password [email or web address] [facility]  [facility]  Office  [phone]  CC: Primary care physician; No primary care provider on file.

## 2015-07-19 LAB — CULTURE, BLOOD (ROUTINE X 2)
CULTURE: NO GROWTH
Culture: NO GROWTH

## 2015-07-19 LAB — GLUCOSE, CAPILLARY
GLUCOSE-CAPILLARY: 178 mg/dL — AB (ref 65–99)
GLUCOSE-CAPILLARY: 290 mg/dL — AB (ref 65–99)
Glucose-Capillary: 255 mg/dL — ABNORMAL HIGH (ref 65–99)
Glucose-Capillary: 282 mg/dL — ABNORMAL HIGH (ref 65–99)

## 2015-07-20 LAB — GLUCOSE, CAPILLARY
GLUCOSE-CAPILLARY: 204 mg/dL — AB (ref 65–99)
GLUCOSE-CAPILLARY: 269 mg/dL — AB (ref 65–99)
Glucose-Capillary: 215 mg/dL — ABNORMAL HIGH (ref 65–99)
Glucose-Capillary: 327 mg/dL — ABNORMAL HIGH (ref 65–99)

## 2015-07-21 LAB — GLUCOSE, CAPILLARY
GLUCOSE-CAPILLARY: 113 mg/dL — AB (ref 65–99)
GLUCOSE-CAPILLARY: 162 mg/dL — AB (ref 65–99)

## 2015-07-22 LAB — GLUCOSE, CAPILLARY
GLUCOSE-CAPILLARY: 211 mg/dL — AB (ref 65–99)
GLUCOSE-CAPILLARY: 206 mg/dL — AB (ref 65–99)
GLUCOSE-CAPILLARY: 141 mg/dL — AB (ref 65–99)
Glucose-Capillary: 294 mg/dL — ABNORMAL HIGH (ref 65–99)
Glucose-Capillary: 219 mg/dL — ABNORMAL HIGH (ref 65–99)
Glucose-Capillary: 155 mg/dL — ABNORMAL HIGH (ref 65–99)
Glucose-Capillary: 272 mg/dL — ABNORMAL HIGH (ref 65–99)

## 2015-07-23 LAB — GLUCOSE, CAPILLARY
GLUCOSE-CAPILLARY: 226 mg/dL — AB (ref 65–99)
Glucose-Capillary: 106 mg/dL — ABNORMAL HIGH (ref 65–99)
Glucose-Capillary: 144 mg/dL — ABNORMAL HIGH (ref 65–99)
Glucose-Capillary: 157 mg/dL — ABNORMAL HIGH (ref 65–99)

## 2015-07-24 LAB — GLUCOSE, CAPILLARY
GLUCOSE-CAPILLARY: 147 mg/dL — AB (ref 65–99)
Glucose-Capillary: 130 mg/dL — ABNORMAL HIGH (ref 65–99)
Glucose-Capillary: 156 mg/dL — ABNORMAL HIGH (ref 65–99)
Glucose-Capillary: 146 mg/dL — ABNORMAL HIGH (ref 65–99)

## 2015-07-25 LAB — GLUCOSE, CAPILLARY
GLUCOSE-CAPILLARY: 118 mg/dL — AB (ref 65–99)
GLUCOSE-CAPILLARY: 140 mg/dL — AB (ref 65–99)
Glucose-Capillary: 124 mg/dL — ABNORMAL HIGH (ref 65–99)
Glucose-Capillary: 157 mg/dL — ABNORMAL HIGH (ref 65–99)

## 2015-07-26 LAB — GLUCOSE, CAPILLARY
GLUCOSE-CAPILLARY: 161 mg/dL — AB (ref 65–99)
GLUCOSE-CAPILLARY: 122 mg/dL — AB (ref 65–99)
GLUCOSE-CAPILLARY: 149 mg/dL — AB (ref 65–99)
Glucose-Capillary: 157 mg/dL — ABNORMAL HIGH (ref 65–99)

## 2015-07-27 LAB — GLUCOSE, CAPILLARY
GLUCOSE-CAPILLARY: 134 mg/dL — AB (ref 65–99)
GLUCOSE-CAPILLARY: 109 mg/dL — AB (ref 65–99)
GLUCOSE-CAPILLARY: 146 mg/dL — AB (ref 65–99)
Glucose-Capillary: 117 mg/dL — ABNORMAL HIGH (ref 65–99)
Glucose-Capillary: 103 mg/dL — ABNORMAL HIGH (ref 65–99)

## 2015-07-28 LAB — GLUCOSE, CAPILLARY
GLUCOSE-CAPILLARY: 164 mg/dL — AB (ref 65–99)
GLUCOSE-CAPILLARY: 177 mg/dL — AB (ref 65–99)
Glucose-Capillary: 156 mg/dL — ABNORMAL HIGH (ref 65–99)
Glucose-Capillary: 203 mg/dL — ABNORMAL HIGH (ref 65–99)

## 2015-07-29 LAB — CBC WITH DIFFERENTIAL/PLATELET
Basophils Absolute: 0.1 10*3/uL (ref 0–0.1)
Basophils Relative: 1 %
Eosinophils Absolute: 0.4 10*3/uL (ref 0–0.7)
Eosinophils Relative: 4 %
HEMATOCRIT: 33.6 % — AB (ref 35.0–47.0)
HEMOGLOBIN: 11.3 g/dL — AB (ref 12.0–16.0)
LYMPHS ABS: 2.1 10*3/uL (ref 1.0–3.6)
LYMPHS PCT: 21 %
MCH: 29.7 pg (ref 26.0–34.0)
MCHC: 33.6 g/dL (ref 32.0–36.0)
MCV: 88.3 fL (ref 80.0–100.0)
MONOS PCT: 6 %
Monocytes Absolute: 0.6 10*3/uL (ref 0.2–0.9)
NEUTROS ABS: 6.7 10*3/uL — AB (ref 1.4–6.5)
NEUTROS PCT: 68 %
Platelets: 323 10*3/uL (ref 150–440)
RBC: 3.81 MIL/uL (ref 3.80–5.20)
RDW: 15.5 % — ABNORMAL HIGH (ref 11.5–14.5)
WBC: 9.8 10*3/uL (ref 3.6–11.0)

## 2015-07-29 LAB — COMPREHENSIVE METABOLIC PANEL
ALBUMIN: 3.2 g/dL — AB (ref 3.5–5.0)
ALK PHOS: 89 U/L (ref 38–126)
ALT: 16 U/L (ref 14–54)
ANION GAP: 9 (ref 5–15)
AST: 14 U/L — ABNORMAL LOW (ref 15–41)
BILIRUBIN TOTAL: 0.2 mg/dL — AB (ref 0.3–1.2)
BUN: 26 mg/dL — ABNORMAL HIGH (ref 6–20)
CALCIUM: 8.9 mg/dL (ref 8.9–10.3)
CO2: 26 mmol/L (ref 22–32)
Chloride: 103 mmol/L (ref 101–111)
Creatinine, Ser: 1.47 mg/dL — ABNORMAL HIGH (ref 0.44–1.00)
GFR calc non Af Amer: 33 mL/min — ABNORMAL LOW (ref >60)
GFR, EST AFRICAN AMERICAN: 39 mL/min — AB (ref >60)
GLUCOSE: 187 mg/dL — AB (ref 65–99)
Potassium: 3.9 mmol/L (ref 3.5–5.1)
Sodium: 138 mmol/L (ref 135–145)
TOTAL PROTEIN: 6.3 g/dL — AB (ref 6.5–8.1)

## 2015-07-29 LAB — GLUCOSE, CAPILLARY
GLUCOSE-CAPILLARY: 207 mg/dL — AB (ref 65–99)
Glucose-Capillary: 183 mg/dL — ABNORMAL HIGH (ref 65–99)
Glucose-Capillary: 137 mg/dL — ABNORMAL HIGH (ref 65–99)
Glucose-Capillary: 177 mg/dL — ABNORMAL HIGH (ref 65–99)

## 2015-07-30 LAB — GLUCOSE, CAPILLARY
GLUCOSE-CAPILLARY: 154 mg/dL — AB (ref 65–99)
GLUCOSE-CAPILLARY: 162 mg/dL — AB (ref 65–99)

## 2019-07-03 ENCOUNTER — Emergency Department: Payer: Medicare Other

## 2019-07-03 ENCOUNTER — Emergency Department
Admission: EM | Admit: 2019-07-03 | Discharge: 2019-07-05 | Disposition: A | Discharge status: Skilled Nursing Facility | Payer: Medicare Other | Attending: Emergency Medicine | Admitting: Emergency Medicine

## 2019-07-03 ENCOUNTER — Encounter: Payer: Self-pay | Admitting: Emergency Medicine

## 2019-07-03 DIAGNOSIS — Y999 Unspecified external cause status: Secondary | ICD-10-CM | POA: Diagnosis not present

## 2019-07-03 DIAGNOSIS — M25561 Pain in right knee: Secondary | ICD-10-CM | POA: Diagnosis not present

## 2019-07-03 DIAGNOSIS — E119 Type 2 diabetes mellitus without complications: Secondary | ICD-10-CM | POA: Diagnosis not present

## 2019-07-03 DIAGNOSIS — I10 Essential (primary) hypertension: Secondary | ICD-10-CM | POA: Diagnosis not present

## 2019-07-03 DIAGNOSIS — R627 Adult failure to thrive: Secondary | ICD-10-CM | POA: Diagnosis not present

## 2019-07-03 DIAGNOSIS — Y929 Unspecified place or not applicable: Secondary | ICD-10-CM | POA: Insufficient documentation

## 2019-07-03 DIAGNOSIS — Y9389 Activity, other specified: Secondary | ICD-10-CM | POA: Diagnosis not present

## 2019-07-03 DIAGNOSIS — R531 Weakness: Secondary | ICD-10-CM | POA: Insufficient documentation

## 2019-07-03 DIAGNOSIS — Z79899 Other long term (current) drug therapy: Secondary | ICD-10-CM | POA: Diagnosis not present

## 2019-07-03 DIAGNOSIS — W19XXXA Unspecified fall, initial encounter: Secondary | ICD-10-CM

## 2019-07-03 DIAGNOSIS — W1839XA Other fall on same level, initial encounter: Secondary | ICD-10-CM | POA: Insufficient documentation

## 2019-07-03 DIAGNOSIS — Z7984 Long term (current) use of oral hypoglycemic drugs: Secondary | ICD-10-CM | POA: Diagnosis not present

## 2019-07-03 MED ORDER — TRAMADOL HCL 50 MG PO TABS
50.0000 mg | ORAL_TABLET | Freq: Once | ORAL | Status: AC
  Administered 2019-07-03: 50 mg via ORAL
  Filled 2019-07-03: qty 1

## 2019-07-03 MED ORDER — TRAMADOL HCL 50 MG PO TABS: 50.0000 mg | ORAL_TABLET | Freq: Four times a day (QID) | ORAL | 0 refills | Status: DC | PRN

## 2019-07-03 NOTE — ED Provider Notes (Signed)
[facility] Emergency Department Provider Note  Time seen: 9:02 PM  I have reviewed the triage vital signs and the nursing notes.   HISTORY  Chief Complaint Fall   HPI Brandi Hoover is a 79 y.o. female with a past medical history of asthma, diabetes, hypertension, hyperlipidemia, presents to the emergency department for right knee pain and difficulty walking.  According to the patient earlier today she had a fall in which she slumped forward landing on her knee.  Patient states she was able to get up with the help of EMS.  States after that she was feeling fine she was able to go out with her daughter was able to walk on the knee although with some discomfort.  She states however when she got back home she sat on the couch and the longer she sat on the couch she states the more where she became of discomfort in the right knee.  She states this became more painful to the point where she could no longer get up and ambulate so they had to call EMS to bring her to the emergency department.  Here the patient appears well.  States she did not fall, states she "slumped" down to the ground.  Denies hitting her head.  Denies LOC.  Patient's only complaint at this time is right knee discomfort.   Patient lives alone however states her daughter often stays with her.  Past Medical History:  Diagnosis Date  . Asthma   . Diabetes mellitus without complication ([place])   . High cholesterol   . Hypertension     Patient Active Problem List   Diagnosis Date Noted  . Respiratory failure with hypoxia ([place]) 07/14/2015  . Pressure ulcer 07/14/2015    Past Surgical History:  Procedure Laterality Date  . right hip replacement      Prior to Admission medications   Medication Sig Start Date End Date Taking? Authorizing Provider  albuterol (VENTOLIN HFA) 108 (90 Base) MCG/ACT inhaler Inhale 2 puffs into the lungs every 6 (six) hours as needed for wheezing.  07/11/15   [provider]  amLODipine (NORVASC) 10 MG tablet Take 10 mg by mouth daily.    [provider]  aspirin EC 81 MG tablet Take 81 mg by mouth daily.    [provider]  benazepril (LOTENSIN) 20 MG tablet Take 20 mg by mouth daily.    [provider]  Calcium Carbonate-Vitamin D 600-400 MG-UNIT tablet Take 1 tablet by mouth at bedtime.    [provider]  docusate sodium (COLACE) 100 MG capsule Take 1 capsule (100 mg total) by mouth 2 (two) times daily. 07/18/15   Gladstone Lighter, MD  Fluticasone-Salmeterol (ADVAIR) 250-50 MCG/DOSE AEPB Inhale 1 puff into the lungs 2 (two) times daily.    [provider]  glipiZIDE (GLUCOTROL) 10 MG tablet Take 10 mg by mouth 2 (two) times daily before a meal.    [provider]  guaiFENesin (MUCINEX) 600 MG 12 hr tablet Take 1 tablet (600 mg total) by mouth 2 (two) times daily. 07/18/15   Gladstone Lighter, MD  guaiFENesin-codeine 100-10 MG/5ML syrup Take 5 mLs by mouth at bedtime as needed for cough. 07/18/15   Gladstone Lighter, MD  hydrochlorothiazide (MICROZIDE) 12.5 MG capsule Take 12.5 mg by mouth daily.    [provider]  ipratropium-albuterol (DUONEB) 0.5-2.5 (3) MG/3ML SOLN Take 3 mLs by nebulization every 6 (six) hours as needed (wheezing, shortness of breath). 07/18/15  Gladstone Lighter, MD  levofloxacin (LEVAQUIN) 500 MG tablet Take 1 tablet (500 mg total) by mouth every other day. X 6 more days 07/18/15   Gladstone Lighter, MD  metFORMIN (GLUCOPHAGE) 500 MG tablet Take 1,000-1,500 mg by mouth 2 (two) times daily with a meal. 1500 mg in the morning and 1000 mg in the evening    [provider]  Multiple Vitamin (MULTIVITAMIN WITH MINERALS) TABS tablet Take 1 tablet by mouth daily.    [provider]  phenol-menthol (CEPASTAT) 14.5 MG lozenge Place 1 lozenge inside cheek as needed for sore throat. 07/18/15   Gladstone Lighter, MD  polyethylene glycol (MIRALAX / GLYCOLAX) packet Take 17 g by  mouth daily. HOLD for > 2 bowel movements/day 07/18/15   Gladstone Lighter, MD  predniSONE (STERAPRED UNI-PAK 21 TAB) 10 MG (21) TBPK tablet Take 1 tablet (10 mg total) by mouth daily. 5 tabs PO x 1 day 4 tabs PO x 1 day 3 tabs PO x 1 day 2 tabs PO x 1 day 1 tab PO x 1 day and stop 07/18/15   Gladstone Lighter, MD  simvastatin (ZOCOR) 20 MG tablet Take 20 mg by mouth at bedtime.    [provider]  vitamin B-12 (CYANOCOBALAMIN) 1000 MCG tablet Take 1,000 mcg by mouth daily.    [provider]    Allergies  Allergen Reactions  . Azithromycin Itching  . Gabapentin Other (See Comments)    Bad dreams, insomnia  . Omeprazole Other (See Comments)    Reaction: unknown  . Ramipril Other (See Comments)    Reaction: unknown    Family History  Problem Relation Age of Onset  . COPD Father   . Stroke Mother     Social History Social History   Tobacco Use  . Smoking status: Never Smoker  . Smokeless tobacco: Never Used  Substance Use Topics  . Alcohol use: No  . Drug use: No    Review of Systems Constitutional: Negative for fever.  Negative for LOC. Cardiovascular: Negative for chest pain. Respiratory: Negative for shortness of breath. Gastrointestinal: Negative for abdominal pain Musculoskeletal: Right knee pain Skin: Negative for skin complaints  Neurological: Negative for headache All other ROS negative  ____________________________________________   PHYSICAL EXAM:  VITAL SIGNS: ED Triage Vitals [07/03/19 2002]  Enc Vitals Group     BP (!) 163/62     Pulse Rate (!) 101     Resp      Temp 98.1 F (36.7 C)     Temp Source Oral     SpO2 94 %     Weight      Height      Head Circumference      Peak Flow      Pain Score      Pain Loc      Pain Edu?      Excl. in [place]?     Constitutional: Alert and oriented. Well appearing and in no distress. Eyes: Normal exam ENT      Head: Normocephalic and atraumatic.      Mouth/Throat: Mucous membranes are  moist. Cardiovascular: Normal rate, regular rhythm.  Respiratory: Normal respiratory effort without tachypnea nor retractions. Breath sounds are clear  Gastrointestinal: Soft and nontender. No distention.   Musculoskeletal: Mild right knee tenderness to palpation, moderate pain with attempted range of motion. Neurologic:  Normal speech and language. No gross focal neurologic deficits  Skin:  Skin is warm, dry and intact.  Psychiatric: Mood and affect  are normal.   ____________________________________________   RADIOLOGY  X-ray negative for acute abnormality besides small knee joint effusion with moderate advanced tricompartmental arthritis.  ____________________________________________   INITIAL IMPRESSION / ASSESSMENT AND PLAN / ED COURSE  Pertinent labs & imaging results that were available during my care of the patient were reviewed by me and considered in my medical decision making (see chart for details).   Patient presents emergency department for right knee pain after fall earlier today.  Patient states since the fall she has been ambulatory however the pain began to worsen to the point where she could no longer ambulate safely for patient.  Differential would include fracture, joint effusion ligamentous or cartilaginous injury.  We will obtain an x-ray treat with tramadol.  We will then attempt to ambulate the patient to see if she could safely be discharged home versus social work and PT evaluation tomorrow.  X-ray consistent with arthritis with possible acute small joint effusion.  No fracture.  We will attempt ambulate the patient.  If the patient is able to ambulate and feels that she could safely be discharged home we will discharge with a short course of Ultram for pain control.  If not we will consult PT and social work to see tomorrow.  Haelee Shinners Mey was evaluated in Emergency Department on 07/03/2019 for the symptoms described in the history of present illness. She was  evaluated in the context of the global COVID-19 pandemic, which necessitated consideration that the patient might be at risk for infection with the SARS-CoV-2 virus that causes COVID-19. Institutional protocols and algorithms that pertain to the evaluation of patients at risk for COVID-19 are in a state of rapid change based on information released by regulatory bodies including the CDC and federal and state organizations. These policies and algorithms were followed during the patient's care in the ED.  ____________________________________________   FINAL CLINICAL IMPRESSION(S) / ED DIAGNOSES  Fall Right knee pain   Harvest Dark, MD 07/09/19 1445

## 2019-07-03 NOTE — Discharge Instructions (Signed)
Please follow-up with orthopedics regarding your knee pain.  You may take your pain medication as needed, but only as prescribed.  Return to the emergency department for any symptom personally concerning to yourself.

## 2019-07-03 NOTE — ED Notes (Signed)
Attempted to ambulate pt. Pt states she uses walker at home, walker provided to assist with ambulation. This nurse allowed pt to attempt to get up just as she would while at home. Pt was able to bring self to a seated position. Pt then was unable to stand from seated position to standing position despite walker being present and then this nurses assistance. Pt became very short of breath on exertion. Pt assisted to position of comfort back in bed. Placed back on monitor and showed spO2 saturations of 92% on RA. Pt placed on 2LPM [place] and saturations increased. MD notified and Social Work consult to follow.

## 2019-07-03 NOTE — ED Triage Notes (Signed)
Pt arrived via EMS from home due to fall while walking with walker. Pt reports she thinks she hung her walker or shoe on something in floor and went down onto her knees. Pt helped to bathroom with this RN and another in triage. Pt able to stand with 2 assist. No obvious deformity noted.

## 2019-07-03 NOTE — ED Triage Notes (Signed)
Pt brought in by EMS from home; 2nd call today for fall, unable to get up unassisted, lives alone

## 2019-07-04 ENCOUNTER — Emergency Department: Payer: Medicare Other

## 2019-07-04 DIAGNOSIS — M25561 Pain in right knee: Secondary | ICD-10-CM | POA: Diagnosis not present

## 2019-07-04 LAB — URINALYSIS, ROUTINE W REFLEX MICROSCOPIC
Bacteria, UA: NONE SEEN
Bilirubin Urine: NEGATIVE
Glucose, UA: 50 mg/dL — AB
Hgb urine dipstick: NEGATIVE
Ketones, ur: NEGATIVE mg/dL
Nitrite: NEGATIVE
Protein, ur: 100 mg/dL — AB
Specific Gravity, Urine: 1.011 (ref 1.005–1.030)
pH: 5 (ref 5.0–8.0)

## 2019-07-04 LAB — COMPREHENSIVE METABOLIC PANEL
ALT: 14 U/L (ref 0–44)
AST: 13 U/L — ABNORMAL LOW (ref 15–41)
Albumin: 3 g/dL — ABNORMAL LOW (ref 3.5–5.0)
Alkaline Phosphatase: 99 U/L (ref 38–126)
Anion gap: 7 (ref 5–15)
BUN: 25 mg/dL — ABNORMAL HIGH (ref 8–23)
CO2: 27 mmol/L (ref 22–32)
Calcium: 8.3 mg/dL — ABNORMAL LOW (ref 8.9–10.3)
Chloride: 104 mmol/L (ref 98–111)
Creatinine, Ser: 1.35 mg/dL — ABNORMAL HIGH (ref 0.44–1.00)
GFR calc Af Amer: 43 mL/min — ABNORMAL LOW (ref >60)
GFR calc non Af Amer: 37 mL/min — ABNORMAL LOW (ref >60)
Glucose, Bld: 216 mg/dL — ABNORMAL HIGH (ref 70–99)
Potassium: 4 mmol/L (ref 3.5–5.1)
Sodium: 138 mmol/L (ref 135–145)
Total Bilirubin: 0.6 mg/dL (ref 0.3–1.2)
Total Protein: 5.9 g/dL — ABNORMAL LOW (ref 6.5–8.1)

## 2019-07-04 LAB — CBC
HCT: 30.3 % — ABNORMAL LOW (ref 36.0–46.0)
Hemoglobin: 9.7 g/dL — ABNORMAL LOW (ref 12.0–15.0)
MCH: 29.7 pg (ref 26.0–34.0)
MCHC: 32 g/dL (ref 30.0–36.0)
MCV: 92.7 fL (ref 80.0–100.0)
Platelets: 244 10*3/uL (ref 150–400)
RBC: 3.27 MIL/uL — ABNORMAL LOW (ref 3.87–5.11)
RDW: 13.6 % (ref 11.5–15.5)
WBC: 10.6 10*3/uL — ABNORMAL HIGH (ref 4.0–10.5)
nRBC: 0 % (ref 0.0–0.2)

## 2019-07-04 LAB — GLUCOSE, CAPILLARY: Glucose-Capillary: 257 mg/dL — ABNORMAL HIGH (ref 70–99)

## 2019-07-04 MED ORDER — CALCIUM CARBONATE-VITAMIN D 500-200 MG-UNIT PO TABS
1.0000 | ORAL_TABLET | Freq: Every day | ORAL | Status: DC
  Administered 2019-07-04: 1 via ORAL
  Filled 2019-07-04 (×2): qty 1

## 2019-07-04 MED ORDER — ASPIRIN EC 81 MG PO TBEC
81.0000 mg | DELAYED_RELEASE_TABLET | Freq: Every day | ORAL | Status: DC
  Administered 2019-07-04 – 2019-07-05 (×2): 81 mg via ORAL
  Filled 2019-07-04 (×2): qty 1

## 2019-07-04 MED ORDER — GLIPIZIDE 10 MG PO TABS
10.0000 mg | ORAL_TABLET | Freq: Two times a day (BID) | ORAL | Status: DC
  Administered 2019-07-05: 10 mg via ORAL
  Filled 2019-07-04 (×3): qty 1

## 2019-07-04 MED ORDER — ADULT MULTIVITAMIN W/MINERALS CH
1.0000 | ORAL_TABLET | Freq: Every day | ORAL | Status: DC
  Administered 2019-07-04: 1 via ORAL
  Filled 2019-07-04: qty 1

## 2019-07-04 MED ORDER — SIMVASTATIN 10 MG PO TABS
20.0000 mg | ORAL_TABLET | Freq: Every day | ORAL | Status: DC
  Administered 2019-07-04: 20 mg via ORAL
  Filled 2019-07-04: qty 2

## 2019-07-04 MED ORDER — VITAMIN B-12 1000 MCG PO TABS
1000.0000 ug | ORAL_TABLET | Freq: Every day | ORAL | Status: DC
  Administered 2019-07-04 – 2019-07-05 (×2): 1000 ug via ORAL
  Filled 2019-07-04 (×2): qty 1

## 2019-07-04 MED ORDER — BENAZEPRIL HCL 20 MG PO TABS
20.0000 mg | ORAL_TABLET | Freq: Two times a day (BID) | ORAL | Status: DC
  Administered 2019-07-04 – 2019-07-05 (×2): 20 mg via ORAL
  Filled 2019-07-04 (×3): qty 1

## 2019-07-04 MED ORDER — AMLODIPINE BESYLATE 5 MG PO TABS
10.0000 mg | ORAL_TABLET | Freq: Every day | ORAL | Status: DC
  Administered 2019-07-04 – 2019-07-05 (×2): 10 mg via ORAL
  Filled 2019-07-04 (×2): qty 2

## 2019-07-04 MED ORDER — METFORMIN HCL 500 MG PO TABS
1000.0000 mg | ORAL_TABLET | Freq: Two times a day (BID) | ORAL | Status: DC
  Administered 2019-07-04: 1000 mg via ORAL
  Administered 2019-07-05: 1500 mg via ORAL
  Filled 2019-07-04: qty 3
  Filled 2019-07-04: qty 2

## 2019-07-04 MED ORDER — CEPHALEXIN 500 MG PO CAPS
500.0000 mg | ORAL_CAPSULE | Freq: Two times a day (BID) | ORAL | Status: DC
  Administered 2019-07-04 – 2019-07-05 (×2): 500 mg via ORAL
  Filled 2019-07-04 (×2): qty 1

## 2019-07-04 MED ORDER — ASCORBIC ACID 500 MG PO TABS
1000.0000 mg | ORAL_TABLET | Freq: Every day | ORAL | Status: DC
  Administered 2019-07-04 – 2019-07-05 (×2): 1000 mg via ORAL
  Filled 2019-07-04 (×2): qty 2

## 2019-07-04 MED ORDER — HYDROCHLOROTHIAZIDE 12.5 MG PO CAPS
12.5000 mg | ORAL_CAPSULE | Freq: Two times a day (BID) | ORAL | Status: DC
  Administered 2019-07-04 – 2019-07-05 (×2): 12.5 mg via ORAL
  Filled 2019-07-04 (×2): qty 1

## 2019-07-04 NOTE — ED Provider Notes (Signed)
-----------------------------------------   11:05 PM on 07/04/2019 -----------------------------------------  Assuming care from Dr. Joan Mayans.  In short, Brandi Hoover is a 79 y.o. female with a chief complaint of weakness and failure to thrive.  Refer to the original H&P for additional details.  The current plan of care is to be reassessed by social work Manuela Schwartz) in the morning given the new PT recommendations.   Possibly patient could go home with home health if family in agreement.  Home meds have been ordered.   Hinda Kehr, MD 07/05/19 [phone]

## 2019-07-04 NOTE — ED Notes (Signed)
PT assisted to use bedpan

## 2019-07-04 NOTE — TOC Progression Note (Signed)
Transition of Care [facility]) - Progression Note    Patient Details  Name: Brandi Hoover MRN: [id number] Date of Birth: [birth year]  Transition of Care [facility]) CM/SW [place], RN Phone Number: 07/04/2019, 4:28 PM  Clinical Narrative:       Expected Discharge Plan: [place] Barriers to Discharge: Continued Medical Work up  Expected Discharge Plan and Services Expected Discharge Plan: [place] Choice: [place] arrangements for the past 2 months: Single Family Home                                       Social Determinants of Health (SDOH) Interventions    Readmission Risk Interventions No flowsheet data found.

## 2019-07-04 NOTE — TOC Progression Note (Signed)
Transition of Care [facility]) - Progression Note    Patient Details  Name: Brandi Hoover MRN: [id number] Date of Birth: Jan 25, 1941  Transition of Care [facility]) CM/SW [place], RN Phone Number: 07/04/2019, 10:48 AM  Clinical Narrative:    Confirmed [place] has availability for patient. Updated ED Nurse and EDP. Daughter requests that she have a couple hours notice prior to discharge to ensure her husband can be at the home to assist patient into home.    Expected Discharge Plan: [place] Barriers to Discharge: Continued Medical Work up  Expected Discharge Plan and Services Expected Discharge Plan: [place] Choice: [place] arrangements for the past 2 months: Single Family Home                                       Social Determinants of Health (SDOH) Interventions    Readmission Risk Interventions No flowsheet data found.

## 2019-07-04 NOTE — ED Provider Notes (Addendum)
10:56 AM Assumed care for off going team.   Blood pressure (!) 163/62, pulse (!) 101, temperature 98.1 F (36.7 C), temperature source Oral, SpO2 94 %.  See their HPI for full report but in brief patient pending social work placement.  Social worker informing that patient was able to go home with PT at home.  I did call patient's daughter who stated that she would like patient to be evaluated by PT first to make sure that she would be safe for home.  I reassessed patient she is able to lift both legs up off the bed.  She is able to move both arms.  There are no obvious neuro deficits to suggest stroke.  She reports not hitting her head.  She states that she just feels weak but denies any pain with trying to ambulate.  I explained the plan with patient and PT and she expressed understanding and felt comfortable with this plan she denies any cough, fevers suggest pneumonia.  Maybe a little bit of right lower leg cellulitis and will start on some Keflex.  Patient handed off and PT evaluatin for final disposition       Vanessa Arkdale, MD 07/04/19 [phone]

## 2019-07-04 NOTE — Evaluation (Signed)
Physical Therapy Evaluation Patient Details Name: Brandi Hoover MRN: [id number] DOB: 11-Jun-1940 Today's Date: 07/04/2019   History of Present Illness  Pt admitted to ED s/p fall and landed on R knee. Pt with negative fractures, however complains of pain in R knee.  Clinical Impression  Pt is a pleasant 79 year old female who was admitted for ED s/p fall and landed on R knee. Pt performs bed mobility with mod/max assist +2 and transfers with max assist and RW with +2. Unable to further ambulate at this time. Pt demonstrates deficits with strength/balance/pain. Currently not at baseline level and isn't safe to dc home at this time. Would benefit from skilled PT to address above deficits and promote optimal return to PLOF; recommend transition to STR upon discharge from acute hospitalization.     Follow Up Recommendations SNF    Equipment Recommendations  Hospital bed;Rolling walker with 5" wheels    Recommendations for Other Services       Precautions / Restrictions Precautions Precautions: Fall Restrictions Weight Bearing Restrictions: No      Mobility  Bed Mobility Overal bed mobility: Needs Assistance Bed Mobility: Supine to Sit     Supine to sit: Mod assist;Max assist;+2 for physical assistance     General bed mobility comments: needs assist for trunk stability and heavy cues for each step in the sequence. Once seated at EOB, needs assist to scoot towards EOB.  Transfers Overall transfer level: Needs assistance Equipment used: Rolling walker (2 wheeled) Transfers: Sit to/from Stand Sit to Stand: Max assist;+2 safety/equipment         General transfer comment: 2 attempts for standing with B knees blocked. Shoes donned prior to transfer. Once standing, only able to maintain standing x 10-15 seconds prior to quick fatigue and pain. 3rd person needed for assist. Unable to further ambulate at this time  Ambulation/Gait             General Gait Details: unable to  take any steps at bedside due to fatigue and pain  Stairs            Wheelchair Mobility    Modified Rankin (Stroke Patients Only)       Balance Overall balance assessment: Needs assistance;History of Falls Sitting-balance support: Feet supported Sitting balance-Leahy Scale: Fair Sitting balance - Comments: post tilt. needs constant supervision to maintain balance   Standing balance support: Bilateral upper extremity supported Standing balance-Leahy Scale: Poor Standing balance comment: post leaning, unable to maintain balance without HHA                             Pertinent Vitals/Pain Pain Assessment: 0-10 Pain Score: 8  Pain Location: R knee Pain Descriptors / Indicators: Aching Pain Intervention(s): Limited activity within patient's tolerance;Repositioned    Home Living Family/patient expects to be discharged to:: Private residence Living Arrangements: Alone Available Help at Discharge: Family(daughter has been staying with her since covid) Type of Home: House Home Access: Stairs to enter Entrance Stairs-Rails: (Pt uses rollater to lift up each step and uses that as rail) Technical brewer of Steps: "a few" Home Layout: One level Home Equipment: Walker - 4 wheels;Bedside commode;Wheelchair - manual      Prior Function Level of Independence: Independent with assistive device(s)         Comments: is typically able to ambulate short distances. Uses WC for longer distances     Hand Dominance  Extremity/Trunk Assessment   Upper Extremity Assessment Upper Extremity Assessment: Generalized weakness(B UE grossly 3+/5)    Lower Extremity Assessment Lower Extremity Assessment: Generalized weakness(B LE grossly 3/5)       Communication   Communication: No difficulties  Cognition Arousal/Alertness: Awake/alert Behavior During Therapy: WFL for tasks assessed/performed Overall Cognitive Status: Within Functional Limits for tasks  assessed                                        General Comments      Exercises Other Exercises Other Exercises: supine ther-ex performed on B LE including AP, SLRs, quad sets, and shoulder flexion. All ther-ex performed x 5 reps with cga. Very effortful and painful   Assessment/Plan    PT Assessment Patient needs continued PT services  PT Problem List Decreased strength;Decreased activity tolerance;Decreased balance;Decreased mobility;Pain;Obesity       PT Treatment Interventions Gait training;DME instruction;Therapeutic activities;Therapeutic exercise;Balance training    PT Goals (Current goals can be found in the Care Plan section)  Acute Rehab PT Goals Patient Stated Goal: to go home PT Goal Formulation: With patient Time For Goal Achievement: 07/18/19 Potential to Achieve Goals: Good    Frequency Min 2X/week   Barriers to discharge        Co-evaluation               AM-PAC PT "6 Clicks" Mobility  Outcome Measure Help needed turning from your back to your side while in a flat bed without using bedrails?: A Lot Help needed moving from lying on your back to sitting on the side of a flat bed without using bedrails?: A Lot Help needed moving to and from a bed to a chair (including a wheelchair)?: Total Help needed standing up from a chair using your arms (e.g., wheelchair or bedside chair)?: Total Help needed to walk in hospital room?: Total Help needed climbing 3-5 steps with a railing? : Total 6 Click Score: 8    End of Session Equipment Utilized During Treatment: Gait belt Activity Tolerance: Patient limited by fatigue;Patient limited by pain Patient left: in bed;with bed alarm set Nurse Communication: Mobility status PT Visit Diagnosis: Muscle weakness (generalized) (M62.81);Difficulty in walking, not elsewhere classified (R26.2);Pain;Unsteadiness on feet (R26.81) Pain - Right/Left: Right Pain - part of body: Knee    Time: [id number] PT  Time Calculation (min) (ACUTE ONLY): 19 min   Charges:   PT Evaluation $PT Eval Moderate Complexity: 1 Mod PT Treatments $Therapeutic Exercise: 8-22 mins        Greggory Stallion, PT, DPT [phone]   Casandra Dallaire 07/04/2019, 4:41 PM

## 2019-07-04 NOTE — ED Notes (Signed)
Pt placed in hospital bed at this time

## 2019-07-04 NOTE — ED Notes (Signed)
Charging pt's phone for her.

## 2019-07-04 NOTE — ED Notes (Signed)
Pt cleaned and new depends applied. Pt placed on purwick.   Pt urinated unknown amount, bedpan spilled.

## 2019-07-04 NOTE — ED Notes (Signed)
PT given phone back

## 2019-07-04 NOTE — TOC Initial Note (Signed)
Transition of Care [facility]) - Initial/Assessment Note    Patient Details  Name: Brandi Hoover MRN: [id number] Date of Birth: [birth year]  Transition of [facility]) CM/SW Contact:    Anselm Pancoast, RN Phone Number: 07/04/2019, 10:38 AM  Clinical Narrative:                 Spoke with patient and daughter. Patient lives alone in her own home however daughter has been spending the majority of her time with patient. Daughter has MS and is not able to get patient upstairs however has family support for needs she is not able to accommodate. Patient uses walker at baseline and is alert and oriented. Goes to hair salon every Monday and has paid caretakers for her home maintenance. Recently quit driving-dtr is able to transport as needed. Patient and daughter are requesting patient home with home health for continued PT rather than SNF placement. 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 Choice given and daughter states they are open to any facility. [place] has availability.   Expected Discharge Plan: [place] Barriers to Discharge: Continued Medical Work up   Patient Goals and CMS Choice Patient states their goals for this hospitalization and ongoing recovery are:: Get stronger and get back home      Expected Discharge Plan and Services Expected Discharge Plan: [place] Choice: [place] arrangements for the past 2 months: Single Family Home                                      Prior Living Arrangements/Services Living arrangements for the past 2 months: Single Family Home Lives with:: Self(Dtr lives with patient majority of time but has anotehr home with husband) Patient language and need for interpreter reviewed:: Yes Do you feel safe going back to the place where you live?: Yes      Need for Family Participation in Patient Care: Yes (Comment) Care giver support system in  place?: Yes (comment) Current home services: DME(walker) Criminal Activity/Legal Involvement Pertinent to Current Situation/Hospitalization: No - Comment as needed  Activities of Daily Living      Permission Sought/Granted Permission sought to share information with : Facility Art therapist granted to share information with : Yes, Verbal Permission Granted  Share Information with NAME: Home health agency representative           Emotional Assessment Appearance:: Appears stated age Attitude/Demeanor/Rapport: Engaged Affect (typically observed): Accepting Orientation: : Oriented to Self, Oriented to Place, Oriented to  Time, Oriented to Situation Alcohol / Substance Use: Never Used Psych Involvement: No (comment)  Admission diagnosis:  Fall  EMS Patient Active Problem List   Diagnosis Date Noted  . Respiratory failure with hypoxia ([place]) 07/14/2015  . Pressure ulcer 07/14/2015   PCP:  Harrel Lemon, MD Pharmacy:   [id number] - [place], [place] - [address] [address] Phone: [phone] Fax: [phone]     Social Determinants of Health (SDOH) Interventions    Readmission Risk Interventions No flowsheet data found.

## 2019-07-04 NOTE — NC FL2 (Signed)
[place] LEVEL OF CARE SCREENING TOOL     IDENTIFICATION  Patient Name: Brandi Hoover Birthdate: 05-Dec-1940 Sex: female Admission Date (Current Location): 07/03/2019  [facility] and [place] Number:      Facility and Address:  [facility], [address], [place], [place] [address]      Provider Number: [id number]  Attending Physician Name and Address:  No att. providers found  Relative Name and Phone Number:       Current Level of Care: Hospital Recommended Level of Care: [place] Prior Approval Number:    Date Approved/Denied:   PASRR Number:    Discharge Plan: SNF    Current Diagnoses: Patient Active Problem List   Diagnosis Date Noted  . Respiratory failure with hypoxia ([place]) 07/14/2015  . Pressure ulcer 07/14/2015    Orientation RESPIRATION BLADDER Height & Weight     Self, Time, Situation, Place  Normal Continent Weight:   Height:     BEHAVIORAL SYMPTOMS/MOOD NEUROLOGICAL BOWEL NUTRITION STATUS      Continent Diet  AMBULATORY STATUS COMMUNICATION OF NEEDS Skin   Extensive Assist Verbally Normal                       Personal Care Assistance Level of Assistance  Bathing, Dressing Bathing Assistance: Limited assistance   Dressing Assistance: Limited assistance     Functional Limitations Info             SPECIAL CARE FACTORS FREQUENCY  PT (By licensed PT), OT (By licensed OT)     PT Frequency: min 5xweekly OT Frequency: min 5xweekly            Contractures      Additional Factors Info                  Current Medications (07/04/2019):  This is the current hospital active medication list Current Facility-Administered Medications  Medication Dose Route Frequency Provider Last Rate Last Admin  . amLODipine (NORVASC) tablet 10 mg  10 mg Oral Daily Lilia Pro., MD      . ascorbic acid (VITAMIN C) tablet 1,000 mg  1,000 mg Oral Daily Lilia Pro., MD      . aspirin EC  tablet 81 mg  81 mg Oral Daily Lilia Pro., MD      . benazepril (LOTENSIN) tablet 20 mg  20 mg Oral BID Lilia Pro., MD      . calcium-vitamin D (OSCAL WITH D) 500-200 MG-UNIT per tablet 1 tablet  1 tablet Oral QHS Lilia Pro., MD      . cephALEXin (KEFLEX) capsule 500 mg  500 mg Oral Q12H Vanessa Esmont, MD      . glipiZIDE (GLUCOTROL) tablet 10 mg  10 mg Oral BID AC Lilia Pro., MD      . hydrochlorothiazide (MICROZIDE) capsule 12.5 mg  12.5 mg Oral BID Lilia Pro., MD      . metFORMIN (GLUCOPHAGE) tablet 1,000-1,500 mg  1,000-1,500 mg Oral BID WC Lilia Pro., MD      . multivitamin with minerals tablet 1 tablet  1 tablet Oral Daily Lilia Pro., MD      . simvastatin (ZOCOR) tablet 20 mg  20 mg Oral QHS Lilia Pro., MD      . vitamin B-12 (CYANOCOBALAMIN) tablet 1,000 mcg  1,000 mcg Oral Daily Lilia Pro., MD       Current Outpatient  Medications  Medication Sig Dispense Refill  . albuterol (VENTOLIN HFA) 108 (90 Base) MCG/ACT inhaler Inhale 2 puffs into the lungs every 6 (six) hours as needed for wheezing.     Marland Kitchen amLODipine (NORVASC) 10 MG tablet Take 10 mg by mouth daily.    Marland Kitchen ascorbic acid (VITAMIN C) 1000 MG tablet Take by mouth.    Marland Kitchen aspirin EC 81 MG tablet Take 81 mg by mouth daily.    . benazepril (LOTENSIN) 20 MG tablet Take 20 mg by mouth 2 (two) times daily.     . Calcium Carbonate-Vitamin D 600-400 MG-UNIT tablet Take 1 tablet by mouth at bedtime.    . docusate sodium (COLACE) 100 MG capsule Take 1 capsule (100 mg total) by mouth 2 (two) times daily. 10 capsule 0  . Fluticasone-Salmeterol (ADVAIR) 250-50 MCG/DOSE AEPB Inhale 1 puff into the lungs 2 (two) times daily.    Marland Kitchen glipiZIDE (GLUCOTROL) 10 MG tablet Take 10 mg by mouth 2 (two) times daily before a meal.    . guaiFENesin (MUCINEX) 600 MG 12 hr tablet Take 1 tablet (600 mg total) by mouth 2 (two) times daily. 30 tablet 0  . guaiFENesin-codeine 100-10 MG/5ML syrup Take 5 mLs by mouth at  bedtime as needed for cough. 118 mL 0  . hydrochlorothiazide (MICROZIDE) 12.5 MG capsule Take 12.5 mg by mouth 2 (two) times daily.     Marland Kitchen ipratropium-albuterol (DUONEB) 0.5-2.5 (3) MG/3ML SOLN Take 3 mLs by nebulization every 6 (six) hours as needed (wheezing, shortness of breath). 360 mL 0  . metFORMIN (GLUCOPHAGE) 500 MG tablet Take 1,000-1,500 mg by mouth 2 (two) times daily with a meal. 1500 mg in the morning and 1000 mg in the evening    . Multiple Vitamin (MULTIVITAMIN WITH MINERALS) TABS tablet Take 1 tablet by mouth daily.    . phenol-menthol (CEPASTAT) 14.5 MG lozenge Place 1 lozenge inside cheek as needed for sore throat. 100 tablet 0  . polyethylene glycol (MIRALAX / GLYCOLAX) packet Take 17 g by mouth daily. HOLD for > 2 bowel movements/day 14 each 0  . simvastatin (ZOCOR) 20 MG tablet Take 20 mg by mouth at bedtime.    . vitamin B-12 (CYANOCOBALAMIN) 1000 MCG tablet Take 1,000 mcg by mouth daily.    Marland Kitchen JANUVIA 25 MG tablet Take 25 mg by mouth daily.    Marland Kitchen levofloxacin (LEVAQUIN) 500 MG tablet Take 1 tablet (500 mg total) by mouth every other day. X 6 more days (Patient not taking: Reported on 07/04/2019) 6 tablet 0  . traMADol (ULTRAM) 50 MG tablet Take 1 tablet (50 mg total) by mouth every 6 (six) hours as needed. 15 tablet 0     Discharge Medications: Please see discharge summary for a list of discharge medications.  Relevant Imaging Results:  Relevant Lab Results:   Additional Information [id number]  Anselm Pancoast, RN

## 2019-07-04 NOTE — ED Notes (Signed)
Pt placed on bedpan at this time.

## 2019-07-04 NOTE — ED Notes (Signed)
Pt was placed on bedpan. Pt urine output was 223mL. Pt is now resting in bed with no complaints with family at bedside.

## 2019-07-05 DIAGNOSIS — M25561 Pain in right knee: Secondary | ICD-10-CM | POA: Diagnosis not present

## 2019-07-05 MED ORDER — TRAMADOL HCL 50 MG PO TABS: 50.0000 mg | ORAL_TABLET | Freq: Four times a day (QID) | ORAL | 0 refills | Status: AC | PRN

## 2019-07-05 MED ORDER — CEPHALEXIN 500 MG PO CAPS: 500.0000 mg | ORAL_CAPSULE | Freq: Two times a day (BID) | ORAL | 0 refills | Status: AC

## 2019-07-05 NOTE — ED Notes (Addendum)
Pt resting in hall bed. Pt requesting water and another blanket, both provided and no further needs verbalized at this time.

## 2019-07-05 NOTE — ED Notes (Addendum)
RN apologized to patient about needing to move patient back out to hallway. Pt is frustrated but verbalized understanding and is cooperative at this time. RN and patient agree to update daughter in the morning.   Screen placed around pts bed and pt resting. NAD at this time.

## 2019-07-05 NOTE — ED Notes (Signed)
Pt assisted to bedpan by this RN, Karena Addison and [employer]. Pt urinated in bed pan. Brief and pad under patient changed and pt wiped with bath wipes.

## 2019-07-05 NOTE — TOC Transition Note (Addendum)
Transition of Care [facility]) - CM/SW Discharge Note   Patient Details  Name: Brandi Hoover MRN: [id number] Date of Birth: Dec 11, 1940  [facility]) CM/SW Contact:  Ova Freshwater Phone Number: [phone] 07/05/2019, 11:08 AM   Clinical Narrative:     Pt. Will d/c to [facility], they will need After Visit Summary faxed to [phone].  Report# [id number], Room#27A.  EDP/ED staff updated.  ED Secretary will arrange transport. [id number]    Barriers to Discharge: Continued Medical Work up   Patient Goals and CMS Choice Patient states their goals for this hospitalization and ongoing recovery are:: Get stronger and get back home      Discharge Placement                       Discharge Plan and Services     Post Acute Care Choice: Home Health                               Social Determinants of Health (SDOH) Interventions     Readmission Risk Interventions No flowsheet data found.

## 2019-07-05 NOTE — ED Notes (Signed)
Pt sleeping. 

## 2019-07-05 NOTE — ED Notes (Signed)
Pt given breakfast tray. Pt states she does not want what is on her tray. Offered pt peanut butter and crackers and she accepts. Gave cranberry juice per request

## 2019-07-05 NOTE — ED Notes (Signed)
Pt awake. Privacy screen removed per pt request and bed adjusted. Pt looking at phone at this time.

## 2020-12-25 DIAGNOSIS — N1832 Chronic kidney disease, stage 3b: Secondary | ICD-10-CM | POA: Diagnosis present

## 2021-01-06 ENCOUNTER — Other Ambulatory Visit: Payer: Self-pay

## 2021-01-06 ENCOUNTER — Emergency Department: Payer: Medicare Other

## 2021-01-06 ENCOUNTER — Encounter: Payer: Self-pay | Admitting: *Deleted

## 2021-01-06 ENCOUNTER — Inpatient Hospital Stay
Admission: EM | Admit: 2021-01-06 | Discharge: 2021-01-23 | DRG: 193 | Disposition: A | Discharge status: Skilled Nursing Facility | Payer: Medicare Other | Attending: Internal Medicine | Admitting: Internal Medicine

## 2021-01-06 DIAGNOSIS — D631 Anemia in chronic kidney disease: Secondary | ICD-10-CM | POA: Diagnosis present

## 2021-01-06 DIAGNOSIS — N1832 Chronic kidney disease, stage 3b: Secondary | ICD-10-CM

## 2021-01-06 DIAGNOSIS — J449 Chronic obstructive pulmonary disease, unspecified: Secondary | ICD-10-CM | POA: Diagnosis present

## 2021-01-06 DIAGNOSIS — J9601 Acute respiratory failure with hypoxia: Secondary | ICD-10-CM | POA: Diagnosis not present

## 2021-01-06 DIAGNOSIS — J123 Human metapneumovirus pneumonia: Principal | ICD-10-CM | POA: Diagnosis present

## 2021-01-06 DIAGNOSIS — J9691 Respiratory failure, unspecified with hypoxia: Secondary | ICD-10-CM | POA: Diagnosis present

## 2021-01-06 DIAGNOSIS — J9811 Atelectasis: Secondary | ICD-10-CM | POA: Diagnosis present

## 2021-01-06 DIAGNOSIS — I10 Essential (primary) hypertension: Secondary | ICD-10-CM | POA: Diagnosis not present

## 2021-01-06 DIAGNOSIS — E7800 Pure hypercholesterolemia, unspecified: Secondary | ICD-10-CM | POA: Diagnosis present

## 2021-01-06 DIAGNOSIS — L8990 Pressure ulcer of unspecified site, unspecified stage: Secondary | ICD-10-CM | POA: Diagnosis present

## 2021-01-06 DIAGNOSIS — Z7984 Long term (current) use of oral hypoglycemic drugs: Secondary | ICD-10-CM

## 2021-01-06 DIAGNOSIS — J441 Chronic obstructive pulmonary disease with (acute) exacerbation: Secondary | ICD-10-CM | POA: Diagnosis present

## 2021-01-06 DIAGNOSIS — R0602 Shortness of breath: Secondary | ICD-10-CM

## 2021-01-06 DIAGNOSIS — J4541 Moderate persistent asthma with (acute) exacerbation: Secondary | ICD-10-CM

## 2021-01-06 DIAGNOSIS — D649 Anemia, unspecified: Secondary | ICD-10-CM | POA: Diagnosis present

## 2021-01-06 DIAGNOSIS — Z888 Allergy status to other drugs, medicaments and biological substances status: Secondary | ICD-10-CM

## 2021-01-06 DIAGNOSIS — Z881 Allergy status to other antibiotic agents status: Secondary | ICD-10-CM

## 2021-01-06 DIAGNOSIS — Z20822 Contact with and (suspected) exposure to covid-19: Secondary | ICD-10-CM | POA: Diagnosis present

## 2021-01-06 DIAGNOSIS — R0902 Hypoxemia: Secondary | ICD-10-CM

## 2021-01-06 DIAGNOSIS — E1122 Type 2 diabetes mellitus with diabetic chronic kidney disease: Secondary | ICD-10-CM

## 2021-01-06 DIAGNOSIS — D3502 Benign neoplasm of left adrenal gland: Secondary | ICD-10-CM | POA: Diagnosis present

## 2021-01-06 DIAGNOSIS — Z7982 Long term (current) use of aspirin: Secondary | ICD-10-CM

## 2021-01-06 DIAGNOSIS — Z825 Family history of asthma and other chronic lower respiratory diseases: Secondary | ICD-10-CM

## 2021-01-06 DIAGNOSIS — E042 Nontoxic multinodular goiter: Secondary | ICD-10-CM | POA: Diagnosis present

## 2021-01-06 DIAGNOSIS — Z7951 Long term (current) use of inhaled steroids: Secondary | ICD-10-CM

## 2021-01-06 DIAGNOSIS — R0600 Dyspnea, unspecified: Secondary | ICD-10-CM

## 2021-01-06 DIAGNOSIS — Z6841 Body Mass Index (BMI) 40.0 and over, adult: Secondary | ICD-10-CM

## 2021-01-06 DIAGNOSIS — J811 Chronic pulmonary edema: Secondary | ICD-10-CM

## 2021-01-06 DIAGNOSIS — E785 Hyperlipidemia, unspecified: Secondary | ICD-10-CM | POA: Insufficient documentation

## 2021-01-06 DIAGNOSIS — Z79899 Other long term (current) drug therapy: Secondary | ICD-10-CM

## 2021-01-06 DIAGNOSIS — E119 Type 2 diabetes mellitus without complications: Secondary | ICD-10-CM | POA: Diagnosis present

## 2021-01-06 DIAGNOSIS — J440 Chronic obstructive pulmonary disease with acute lower respiratory infection: Secondary | ICD-10-CM | POA: Diagnosis present

## 2021-01-06 DIAGNOSIS — J129 Viral pneumonia, unspecified: Secondary | ICD-10-CM

## 2021-01-06 DIAGNOSIS — J069 Acute upper respiratory infection, unspecified: Secondary | ICD-10-CM

## 2021-01-06 DIAGNOSIS — N1831 Chronic kidney disease, stage 3a: Secondary | ICD-10-CM | POA: Diagnosis present

## 2021-01-06 LAB — BASIC METABOLIC PANEL
Anion gap: 8 (ref 5–15)
BUN: 28 mg/dL — ABNORMAL HIGH (ref 8–23)
CO2: 25 mmol/L (ref 22–32)
Calcium: 8.6 mg/dL — ABNORMAL LOW (ref 8.9–10.3)
Chloride: 104 mmol/L (ref 98–111)
Creatinine, Ser: 1.72 mg/dL — ABNORMAL HIGH (ref 0.44–1.00)
GFR, Estimated: 29 mL/min — ABNORMAL LOW (ref >60)
Glucose, Bld: 201 mg/dL — ABNORMAL HIGH (ref 70–99)
Potassium: 3.7 mmol/L (ref 3.5–5.1)
Sodium: 137 mmol/L (ref 135–145)

## 2021-01-06 LAB — CBC
HCT: 32.7 % — ABNORMAL LOW (ref 36.0–46.0)
Hemoglobin: 10.5 g/dL — ABNORMAL LOW (ref 12.0–15.0)
MCH: 29.7 pg (ref 26.0–34.0)
MCHC: 32.1 g/dL (ref 30.0–36.0)
MCV: 92.4 fL (ref 80.0–100.0)
Platelets: 262 10*3/uL (ref 150–400)
RBC: 3.54 MIL/uL — ABNORMAL LOW (ref 3.87–5.11)
RDW: 13.5 % (ref 11.5–15.5)
WBC: 7.9 10*3/uL (ref 4.0–10.5)
nRBC: 0 % (ref 0.0–0.2)

## 2021-01-06 LAB — TROPONIN I (HIGH SENSITIVITY)
Troponin I (High Sensitivity): 10 ng/L (ref <18)
Troponin I (High Sensitivity): 14 ng/L (ref <18)

## 2021-01-06 LAB — RESP PANEL BY RT-PCR (FLU A&B, COVID) ARPGX2
Influenza A by PCR: NEGATIVE
Influenza B by PCR: NEGATIVE
SARS Coronavirus 2 by RT PCR: NEGATIVE

## 2021-01-06 MED ORDER — IPRATROPIUM-ALBUTEROL 0.5-2.5 (3) MG/3ML IN SOLN
6.0000 mL | Freq: Once | RESPIRATORY_TRACT | Status: AC
  Administered 2021-01-06: 6 mL via RESPIRATORY_TRACT

## 2021-01-06 MED ORDER — ALBUTEROL SULFATE HFA 108 (90 BASE) MCG/ACT IN AERS
2.0000 | INHALATION_SPRAY | RESPIRATORY_TRACT | Status: DC | PRN
  Filled 2021-01-06: qty 6.7

## 2021-01-06 MED ORDER — METHYLPREDNISOLONE SODIUM SUCC 125 MG IJ SOLR
125.0000 mg | Freq: Once | INTRAMUSCULAR | Status: AC
  Administered 2021-01-06: 125 mg via INTRAVENOUS

## 2021-01-06 NOTE — H&P (Signed)
History and Physical    Brandi Hoover ZOX:[id number] DOB: August 20, 1940 DOA: 01/06/2021  PCP: Harrel Lemon, MD    Patient coming from:  Home   Chief Complaint:  Shortness of breath   HPI:  Brandi Hoover is a 80 y.o. female seen in ed with complaints of shortness of breath and wheezing for the past few days.  Patient states that her daughter has been ill with the flu and she has been exposed.  She denies any fevers chills chest pain palpitations headaches blurred vision speech or gait issues at baseline she states that she can keep her house clean and take care of herself her kitchen her home.  But right now she cannot.  Patient also denies any fevers chills abdominal pain nausea vomiting diarrhea urinary bladder issues skin or joint issues.  Patient states that she does note in addition to the shortness of breath gets worse when she is walking or when she is laying down and she prefers to sleep sitting up.  Patient reports that her right leg has been swelling for an unknown duration but usually does not get swelling in her legs.  And at the end of encounter patient reports chest discomfort that is with shortness of breath and on exertion. She cannot quantify how much out of a 10 she states that it varies with the distance and the amount of exertion.  And the only blood thinner she takes is a baby aspirin.  Patient is not on oxygen at home.  Pt has past medical history of asthma, diabetes mellitus type 2, hypertension, obesity.   ED Course:  Vitals:   01/06/21 1205 01/06/21 1730 01/06/21 1957 01/06/21 2144  BP:  (!) 185/99 (!) 127/107 (!) 126/56  Pulse:  98 (!) 55 99  Resp:  15 (!) 23 20  Temp:    98.5 F (36.9 C)  TempSrc:    Oral  SpO2:  90% 93% 94%  Weight: 119.9 kg     Height: 5' 6.5" (1.689 m)     Emergency room patient is alert awake oriented meeting SIRS criteria with heart rate and respiratory rate however she is not ill-appearing or in any distress.  Patient is a good  historian understands her illness and is oriented.  Daughter lives with her currently. Initial EKG shows sinus rhythm 99 with no ST-T wave changes.  Initial x-ray is negative for any pneumonia pneumothorax, widened mediastinum rib fractures minimal bibasilar atelectasis present.  Blood work today shows hemoglobin of 10.5, glucose of 201 and serum creatinine of 1.72.  Review of Systems:  Review of Systems  Constitutional:  Positive for malaise/fatigue.  Respiratory:  Positive for cough, shortness of breath and wheezing.   All other systems reviewed and are negative.   Past Medical History:  Diagnosis Date   Asthma    Diabetes mellitus without complication ([place])    High cholesterol    Hypertension     Past Surgical History:  Procedure Laterality Date   right hip replacement       reports that she has never smoked. She has never used smokeless tobacco. She reports that she does not drink alcohol and does not use drugs.  Allergies  Allergen Reactions   Mometasone Furo-Formoterol Fum Shortness Of Breath    Pt unable to breath   Azithromycin Itching   Gabapentin Other (See Comments)    Bad dreams, insomnia   Omeprazole Other (See Comments)    Reaction: unknown   Other Other (See Comments)  Made her feel worse   Ramipril Other (See Comments)    Reaction: unknown    Family History  Problem Relation Age of Onset   COPD Father    Stroke Mother     Prior to Admission medications   Medication Sig Start Date End Date Taking? Authorizing Provider  albuterol (VENTOLIN HFA) 108 (90 Base) MCG/ACT inhaler Inhale 2 puffs into the lungs every 6 (six) hours as needed for wheezing.  07/11/15   [provider]  amLODipine (NORVASC) 10 MG tablet Take 10 mg by mouth daily.    [provider]  ascorbic acid (VITAMIN C) 1000 MG tablet Take by mouth.    [provider]  aspirin EC 81 MG tablet Take 81 mg by mouth daily.    [provider]  benazepril  (LOTENSIN) 20 MG tablet Take 20 mg by mouth 2 (two) times daily.     [provider]  Calcium Carbonate-Vitamin D 600-400 MG-UNIT tablet Take 1 tablet by mouth at bedtime.    [provider]  docusate sodium (COLACE) 100 MG capsule Take 1 capsule (100 mg total) by mouth 2 (two) times daily. 07/18/15   Gladstone Lighter, MD  Fluticasone-Salmeterol (ADVAIR) 250-50 MCG/DOSE AEPB Inhale 1 puff into the lungs 2 (two) times daily.    [provider]  glipiZIDE (GLUCOTROL) 10 MG tablet Take 10 mg by mouth 2 (two) times daily before a meal.    [provider]  guaiFENesin (MUCINEX) 600 MG 12 hr tablet Take 1 tablet (600 mg total) by mouth 2 (two) times daily. 07/18/15   Gladstone Lighter, MD  guaiFENesin-codeine 100-10 MG/5ML syrup Take 5 mLs by mouth at bedtime as needed for cough. 07/18/15   Gladstone Lighter, MD  hydrochlorothiazide (MICROZIDE) 12.5 MG capsule Take 12.5 mg by mouth 2 (two) times daily.     [provider]  ipratropium-albuterol (DUONEB) 0.5-2.5 (3) MG/3ML SOLN Take 3 mLs by nebulization every 6 (six) hours as needed (wheezing, shortness of breath). 07/18/15   Gladstone Lighter, MD  JANUVIA 25 MG tablet Take 25 mg by mouth daily. 06/22/19   [provider]  metFORMIN (GLUCOPHAGE) 500 MG tablet Take 1,000-1,500 mg by mouth 2 (two) times daily with a meal. 1500 mg in the morning and 1000 mg in the evening    [provider]  Multiple Vitamin (MULTIVITAMIN WITH MINERALS) TABS tablet Take 1 tablet by mouth daily.    [provider]  phenol-menthol (CEPASTAT) 14.5 MG lozenge Place 1 lozenge inside cheek as needed for sore throat. 07/18/15   Gladstone Lighter, MD  polyethylene glycol (MIRALAX / GLYCOLAX) packet Take 17 g by mouth daily. HOLD for > 2 bowel movements/day 07/18/15   Gladstone Lighter, MD  simvastatin (ZOCOR) 20 MG tablet Take 20 mg by mouth at bedtime.    [provider]  traMADol (ULTRAM) 50 MG tablet Take 1  tablet (50 mg total) by mouth every 6 (six) hours as needed. 07/05/19   Carrie Mew, MD  vitamin B-12 (CYANOCOBALAMIN) 1000 MCG tablet Take 1,000 mcg by mouth daily.    [provider]    Physical Exam: Vitals:   01/06/21 1205 01/06/21 1730 01/06/21 1957 01/06/21 2144  BP:  (!) 185/99 (!) 127/107 (!) 126/56  Pulse:  98 (!) 55 99  Resp:  15 (!) 23 20  Temp:    98.5 F (36.9 C)  TempSrc:    Oral  SpO2:  90% 93% 94%  Weight: 119.9 kg  Height: 5' 6.5" (1.689 m)      Physical Exam Vitals and nursing note reviewed.  Constitutional:      General: She is not in acute distress.    Appearance: Normal appearance. She is obese. She is not ill-appearing, toxic-appearing or diaphoretic.  HENT:     Head: Normocephalic and atraumatic.     Right Ear: External ear normal.     Left Ear: External ear normal.     Nose: Nose normal.     Mouth/Throat:     Mouth: Mucous membranes are moist.  Eyes:     Extraocular Movements: Extraocular movements intact.     Pupils: Pupils are equal, round, and reactive to light.  Neck:     Thyroid: Thyroid mass present. No thyroid tenderness.     Vascular: No carotid bruit.      Comments: Large soft uniform left-sided swelling on the next patient was told that his goiter. Cardiovascular:     Rate and Rhythm: Normal rate and regular rhythm.     Pulses: Normal pulses.     Heart sounds: Normal heart sounds.  Pulmonary:     Effort: Pulmonary effort is normal.     Breath sounds: Examination of the right-lower field reveals wheezing. Examination of the left-lower field reveals wheezing. Wheezing present.       Comments: Basilar atelectasis. Abdominal:     General: Bowel sounds are normal. There is no distension.     Palpations: Abdomen is soft. There is no mass.     Tenderness: There is no abdominal tenderness. There is no guarding.     Hernia: No hernia is present.  Musculoskeletal:     Right lower leg: No edema.     Left lower leg: No  edema.  Skin:    General: Skin is warm.  Neurological:     General: No focal deficit present.     Mental Status: She is alert and oriented to person, place, and time.  Psychiatric:        Mood and Affect: Mood normal.        Behavior: Behavior normal.    Labs on Admission: I have personally reviewed following labs and imaging studies  No results for input(s): CKTOTAL, CKMB, TROPONINI in the last 72 hours. Lab Results  Component Value Date   WBC 7.9 01/06/2021   HGB 10.5 (L) 01/06/2021   HCT 32.7 (L) 01/06/2021   MCV 92.4 01/06/2021   PLT 262 01/06/2021    Recent Labs  Lab 01/06/21 1209  NA 137  K 3.7  CL 104  CO2 25  BUN 28*  CREATININE 1.72*  CALCIUM 8.6*  GLUCOSE 201*   No results found for: CHOL, HDL, LDLCALC, TRIG No results found for: DDIMER Invalid input(s): POCBNP   COVID-19 Labs No results for input(s): DDIMER, FERRITIN, LDH, CRP in the last 72 hours. Lab Results  Component Value Date   [place] NEGATIVE 01/06/2021    Radiological Exams on Admission: DG Chest 2 View  Result Date: 01/06/2021 CLINICAL DATA:  Shortness of breath. EXAM: CHEST - 2 VIEW COMPARISON:  Jul 04, 2019. FINDINGS: Stable cardiomediastinal silhouette. Minimal bibasilar subsegmental atelectasis is noted. Bony thorax is unremarkable. IMPRESSION: Minimal bibasilar subsegmental atelectasis. Electronically Signed   By: Marijo Conception M.D.   On: 01/06/2021 13:26    EKG: Independently reviewed.  Sinus rhythm 99 with no  ST-T wave changes  Echocardiogram: Ordered and pending.    Assessment/Plan: Principal Problem:   Respiratory failure with hypoxia (  [place]) Active Problems:   COPD (chronic obstructive pulmonary disease) (HCC)   Anemia   Hypertension   Stage 3b chronic kidney disease (HCC)   Type 2 diabetes mellitus with stage 3b chronic kidney disease, without long-term current use of insulin (HCC)   Hyperlipidemia   Multinodular goiter Respiratory failure with  hypoxia/COPD: Differentials include COPD exacerbation versus volume overload pulmonary vascular congestion right heart failure congestive heart failure PE. Will admit patient to telemetry unit with continuous cardiac monitoring. Will start treatment with Solu-Medrol, doxycycline and DuoNeb and as needed MDI and supplemental oxygen for goal sats above 90. We will obtain a 2D echocardiogram and a VQ scan.  Anemia:  07/14/15 00:57 07/15/15 04:47 07/29/15 09:30 07/04/19 01:07 01/06/21 12:09  Hemoglobin 10.6 (L) 10.0 (L) 11.3 (L) 9.7 (L) 10.5 (L)  Suspect anemia of chronic disease with normal RDW and normal platelet count. Suspect combination of anemia of chronic disease per indices. We will follow.   Hypertension: Blood pressure (!) 126/56, pulse 99, temperature 98.5 F (36.9 C), temperature source Oral, resp. rate 20, height 5' 6.5" (1.689 m), weight 119.9 kg, SpO2 94 %. We will continue patient on amlodipine  Stage IIIa CKD:  07/14/15 00:57 07/15/15 04:47 07/17/15 05:14 07/29/15 09:30 07/04/19 01:07 01/06/21 12:09  Creatinine 1.38 (H) 1.52 (H) 1.30 (H) 1.47 (H) 1.35 (H) 1.72 (H)  We will renally dose all needed medications and avoid any contrast.   Diabetes mellitus type 2: Sliding scale insulin, Accu-Cheks, A1c. Hold home regimen of metformin.  MNG: Outpatient endo followup.  No dysphagia.    DVT prophylaxis:  Heparin  Code Status:  Full code  Family Communication:  Herbert Seta (Daughter)  [phone] (Mobile)   Disposition Plan:  Home  Consults called:  None  Admission status: Inpatient   Para Skeans MD [facility] [phone] How to contact the [facility] Attending or Consulting provider [place] or covering provider during after hours [place], for this patient.    Check the care team in [facility] and look for a) attending/consulting [facility] provider listed and b) the [facility] team listed Log into [email or web address] and use [place]'s universal password to access. If  you do not have the password, please contact the hospital operator. Locate the [facility] provider you are looking for under [facility] and page to a number that you can be directly reached. If you still have difficulty reaching the provider, please page the [facility] (Director on Call) for the Hospitalists listed on amion for assistance. [email or web address] Password [email or web address] 01/07/2021, 12:19 AM

## 2021-01-06 NOTE — ED Provider Notes (Signed)
[facility] Emergency Department Provider Note   ____________________________________________   Event Date/Time   First MD Initiated Contact with Patient 01/06/21 1710     (approximate)  I have reviewed the triage vital signs and the nursing notes.   HISTORY  Chief Complaint Shortness of Breath  HPI Brandi Hoover is a 80 y.o. female who presents for shortness of breath  LOCATION: Chest DURATION: 3 days prior to arrival TIMING: Worsening since onset SEVERITY: Severe QUALITY: Shortness of breath CONTEXT: Patient states over the last 3 days has had worsening shortness of breath with associated productive cough MODIFYING FACTORS: Worsens with exertion and partially liters with rest and submental oxygenation ASSOCIATED SYMPTOMS: Productive cough of white sputum and chest pain   Per medical record review, patient has history of asthma not on oxygen at home          Past Medical History:  Diagnosis Date   Asthma    Diabetes mellitus without complication ([place])    High cholesterol    Hypertension     Patient Active Problem List   Diagnosis Date Noted   Respiratory failure with hypoxia ([place]) 07/14/2015   Pressure ulcer 07/14/2015    Past Surgical History:  Procedure Laterality Date   right hip replacement      Prior to Admission medications   Medication Sig Start Date End Date Taking? Authorizing Provider  albuterol (VENTOLIN HFA) 108 (90 Base) MCG/ACT inhaler Inhale 2 puffs into the lungs every 6 (six) hours as needed for wheezing.  07/11/15   [provider]  amLODipine (NORVASC) 10 MG tablet Take 10 mg by mouth daily.    [provider]  ascorbic acid (VITAMIN C) 1000 MG tablet Take by mouth.    [provider]  aspirin EC 81 MG tablet Take 81 mg by mouth daily.    [provider]  benazepril (LOTENSIN) 20 MG tablet Take 20 mg by mouth 2 (two) times daily.     [provider]  Calcium  Carbonate-Vitamin D 600-400 MG-UNIT tablet Take 1 tablet by mouth at bedtime.    [provider]  docusate sodium (COLACE) 100 MG capsule Take 1 capsule (100 mg total) by mouth 2 (two) times daily. 07/18/15   Gladstone Lighter, MD  Fluticasone-Salmeterol (ADVAIR) 250-50 MCG/DOSE AEPB Inhale 1 puff into the lungs 2 (two) times daily.    [provider]  glipiZIDE (GLUCOTROL) 10 MG tablet Take 10 mg by mouth 2 (two) times daily before a meal.    [provider]  guaiFENesin (MUCINEX) 600 MG 12 hr tablet Take 1 tablet (600 mg total) by mouth 2 (two) times daily. 07/18/15   Gladstone Lighter, MD  guaiFENesin-codeine 100-10 MG/5ML syrup Take 5 mLs by mouth at bedtime as needed for cough. 07/18/15   Gladstone Lighter, MD  hydrochlorothiazide (MICROZIDE) 12.5 MG capsule Take 12.5 mg by mouth 2 (two) times daily.     [provider]  ipratropium-albuterol (DUONEB) 0.5-2.5 (3) MG/3ML SOLN Take 3 mLs by nebulization every 6 (six) hours as needed (wheezing, shortness of breath). 07/18/15   Gladstone Lighter, MD  JANUVIA 25 MG tablet Take 25 mg by mouth daily. 06/22/19   [provider]  metFORMIN (GLUCOPHAGE) 500 MG tablet Take 1,000-1,500 mg by mouth 2 (two) times daily with a meal. 1500 mg in the morning and 1000 mg in the evening    [provider]  Multiple Vitamin (MULTIVITAMIN WITH MINERALS) TABS tablet Take 1 tablet by mouth  daily.    [provider]  phenol-menthol (CEPASTAT) 14.5 MG lozenge Place 1 lozenge inside cheek as needed for sore throat. 07/18/15   Gladstone Lighter, MD  polyethylene glycol (MIRALAX / GLYCOLAX) packet Take 17 g by mouth daily. HOLD for > 2 bowel movements/day 07/18/15   Gladstone Lighter, MD  simvastatin (ZOCOR) 20 MG tablet Take 20 mg by mouth at bedtime.    [provider]  traMADol (ULTRAM) 50 MG tablet Take 1 tablet (50 mg total) by mouth every 6 (six) hours as needed. 07/05/19   Carrie Mew, MD  vitamin  B-12 (CYANOCOBALAMIN) 1000 MCG tablet Take 1,000 mcg by mouth daily.    [provider]    Allergies Mometasone furo-formoterol fum, Azithromycin, Gabapentin, Omeprazole, Other, and Ramipril  Family History  Problem Relation Age of Onset   COPD Father    Stroke Mother     Social History Social History   Tobacco Use   Smoking status: Never   Smokeless tobacco: Never  Substance Use Topics   Alcohol use: No   Drug use: No    Review of Systems Constitutional: No fever/chills Eyes: No visual changes. ENT: No sore throat. Cardiovascular: Endorses chest pain. Respiratory: Endorses shortness of breath. Gastrointestinal: No abdominal pain.  No nausea, no vomiting.  No diarrhea. Genitourinary: Negative for dysuria. Musculoskeletal: Negative for acute arthralgias Skin: Negative for rash. Neurological: Negative for headaches, weakness/numbness/paresthesias in any extremity Psychiatric: Negative for suicidal ideation/homicidal ideation   ____________________________________________   PHYSICAL EXAM:  VITAL SIGNS: ED Triage Vitals  Enc Vitals Group     BP 01/06/21 1204 (!) 149/93     Pulse Rate 01/06/21 1204 (!) 103     Resp 01/06/21 1204 20     Temp 01/06/21 1204 98.5 F (36.9 C)     Temp Source 01/06/21 1204 Oral     SpO2 01/06/21 1204 92 %     Weight 01/06/21 1205 264 lb 5.3 oz (119.9 kg)     Height 01/06/21 1205 5' 6.5" (1.689 m)     Head Circumference --      Peak Flow --      Pain Score 01/06/21 1205 0     Pain Loc --      Pain Edu? --      Excl. in [place]? --    Constitutional: Alert and oriented. Well appearing elderly obese Caucasian female in mild acute respiratory distress. Eyes: Conjunctivae are normal. PERRL. Head: Atraumatic. Nose: No congestion/rhinnorhea. Mouth/Throat: Mucous membranes are moist. Neck: No stridor Cardiovascular: Grossly normal heart sounds.  Good peripheral circulation. Respiratory: Increased respiratory effort.  2 L nasal  cannula in place with inspiratory and expiratory wheezes over bilateral lung fields Gastrointestinal: Soft and nontender. No distention. Musculoskeletal: No obvious deformities Neurologic:  Normal speech and language. No gross focal neurologic deficits are appreciated. Skin:  Skin is warm and dry. No rash noted. Psychiatric: Mood and affect are normal. Speech and behavior are normal.  ____________________________________________   LABS (all labs ordered are listed, but only abnormal results are displayed)  Labs Reviewed  CBC - Abnormal; Notable for the following components:      Result Value   RBC 3.54 (*)    Hemoglobin 10.5 (*)    HCT 32.7 (*)    All other components within normal limits  BASIC METABOLIC PANEL - Abnormal; Notable for the following components:   Glucose, Bld 201 (*)    BUN 28 (*)    Creatinine, Ser 1.72 (*)  Calcium 8.6 (*)    GFR, Estimated 29 (*)    All other components within normal limits  RESP PANEL BY RT-PCR (FLU A&B, COVID) ARPGX2  TROPONIN I (HIGH SENSITIVITY)  TROPONIN I (HIGH SENSITIVITY)   ____________________________________________  EKG  ED ECG REPORT I, Naaman Plummer, the attending physician, personally viewed and interpreted this ECG.  Date: 01/06/2021 EKG Time: 1205 Rate: 99 Rhythm: normal sinus rhythm QRS Axis: normal Intervals: normal ST/T Wave abnormalities: normal Narrative Interpretation: no evidence of acute ischemia  ____________________________________________  RADIOLOGY  ED MD interpretation: 2 view chest x-ray shows no evidence of acute abnormalities including no pneumonia, pneumothorax, or widened mediastinum.  Minimal bibasilar subsegmental atelectasis  Official radiology report(s): DG Chest 2 View  Result Date: 01/06/2021 CLINICAL DATA:  Shortness of breath. EXAM: CHEST - 2 VIEW COMPARISON:  Jul 04, 2019. FINDINGS: Stable cardiomediastinal silhouette. Minimal bibasilar subsegmental atelectasis is noted. Bony  thorax is unremarkable. IMPRESSION: Minimal bibasilar subsegmental atelectasis. Electronically Signed   By: Marijo Conception M.D.   On: 01/06/2021 13:26    ____________________________________________   PROCEDURES  Procedure(s) performed (including Critical Care):  .1-3 Lead EKG Interpretation Performed by: Naaman Plummer, MD Authorized by: Naaman Plummer, MD     Interpretation: normal     ECG rate:  97   ECG rate assessment: normal     Rhythm: sinus rhythm     Ectopy: none     Conduction: normal    CRITICAL CARE Performed by: Naaman Plummer   Total critical care time: 33 minutes  Critical care time was exclusive of separately billable procedures and treating other patients.  Critical care was necessary to treat or prevent imminent or life-threatening deterioration.  Critical care was time spent personally by me on the following activities: development of treatment plan with patient and/or surrogate as well as nursing, discussions with consultants, evaluation of patient's response to treatment, examination of patient, obtaining history from patient or surrogate, ordering and performing treatments and interventions, ordering and review of laboratory studies, ordering and review of radiographic studies, pulse oximetry and re-evaluation of patient's condition.  ____________________________________________   INITIAL IMPRESSION / ASSESSMENT AND PLAN / ED COURSE  As part of my medical decision making, I reviewed the following data within the electronic medical record, if available:  Nursing notes reviewed and incorporated, Labs reviewed, EKG interpreted, Old chart reviewed, Radiograph reviewed and Notes from prior ED visits reviewed and incorporated        The patient appears to be suffering from a moderate/severe exacerbation of asthma.  Based on the history, exam, CXR/EKG reviewed by me, and further workup I dont suspect any other emergent cause of this presentation, such as  pneumonia, acute coronary syndrome, congestive heart failure, pulmonary embolism, or pneumothorax.  ED Interventions: bronchodilators, steroids, antibiotics, reassess  Reassessment: After treatment, the patients shortness of breath is improving but patient is still requiring supplemental oxygenation   Disposition: Admit      ____________________________________________   FINAL CLINICAL IMPRESSION(S) / ED DIAGNOSES  Final diagnoses:  Moderate persistent asthma with exacerbation  SOB (shortness of breath)  Acute respiratory failure with hypoxia (HCC)  Upper respiratory tract infection, unspecified type     ED Discharge Orders     None        Note:  This document was prepared using Dragon voice recognition software and may include unintentional dictation errors.    Naaman Plummer, MD 01/06/21 [phone]

## 2021-01-06 NOTE — ED Triage Notes (Addendum)
Pt to ED reporting congestion and shortness of breath x 3 days. Congested cough noted and pt reports worsening shortness of breath and chest pain when ambulating. Hx of Asthma but reports no relief with home medications. NO lightheadedness or dizziness reported.   New oxygen demand. PT on 2L when she arrived to triage but denies using oxygen at home.

## 2021-01-07 ENCOUNTER — Other Ambulatory Visit: Payer: Medicare Other

## 2021-01-07 ENCOUNTER — Inpatient Hospital Stay (HOSPITAL_COMMUNITY)
Admit: 2021-01-07 | Discharge: 2021-01-07 | Disposition: A | Discharge status: Home / Self Care | Payer: Medicare Other | Attending: Internal Medicine | Admitting: Internal Medicine

## 2021-01-07 ENCOUNTER — Encounter: Payer: Self-pay | Admitting: Internal Medicine

## 2021-01-07 DIAGNOSIS — J4541 Moderate persistent asthma with (acute) exacerbation: Secondary | ICD-10-CM | POA: Diagnosis present

## 2021-01-07 DIAGNOSIS — Z7984 Long term (current) use of oral hypoglycemic drugs: Secondary | ICD-10-CM | POA: Diagnosis not present

## 2021-01-07 DIAGNOSIS — Z881 Allergy status to other antibiotic agents status: Secondary | ICD-10-CM | POA: Diagnosis not present

## 2021-01-07 DIAGNOSIS — Z825 Family history of asthma and other chronic lower respiratory diseases: Secondary | ICD-10-CM | POA: Diagnosis not present

## 2021-01-07 DIAGNOSIS — N1832 Chronic kidney disease, stage 3b: Secondary | ICD-10-CM | POA: Diagnosis not present

## 2021-01-07 DIAGNOSIS — J9601 Acute respiratory failure with hypoxia: Secondary | ICD-10-CM | POA: Diagnosis present

## 2021-01-07 DIAGNOSIS — I10 Essential (primary) hypertension: Secondary | ICD-10-CM | POA: Diagnosis not present

## 2021-01-07 DIAGNOSIS — Z6841 Body Mass Index (BMI) 40.0 and over, adult: Secondary | ICD-10-CM | POA: Diagnosis not present

## 2021-01-07 DIAGNOSIS — J440 Chronic obstructive pulmonary disease with acute lower respiratory infection: Secondary | ICD-10-CM | POA: Diagnosis present

## 2021-01-07 DIAGNOSIS — D649 Anemia, unspecified: Secondary | ICD-10-CM | POA: Diagnosis not present

## 2021-01-07 DIAGNOSIS — Z888 Allergy status to other drugs, medicaments and biological substances status: Secondary | ICD-10-CM | POA: Diagnosis not present

## 2021-01-07 DIAGNOSIS — Z20822 Contact with and (suspected) exposure to covid-19: Secondary | ICD-10-CM | POA: Diagnosis present

## 2021-01-07 DIAGNOSIS — D631 Anemia in chronic kidney disease: Secondary | ICD-10-CM | POA: Diagnosis present

## 2021-01-07 DIAGNOSIS — J9811 Atelectasis: Secondary | ICD-10-CM | POA: Diagnosis present

## 2021-01-07 DIAGNOSIS — D3502 Benign neoplasm of left adrenal gland: Secondary | ICD-10-CM | POA: Diagnosis present

## 2021-01-07 DIAGNOSIS — R0602 Shortness of breath: Secondary | ICD-10-CM | POA: Diagnosis present

## 2021-01-07 DIAGNOSIS — Z7951 Long term (current) use of inhaled steroids: Secondary | ICD-10-CM | POA: Diagnosis not present

## 2021-01-07 DIAGNOSIS — I5031 Acute diastolic (congestive) heart failure: Secondary | ICD-10-CM | POA: Diagnosis not present

## 2021-01-07 DIAGNOSIS — N1831 Chronic kidney disease, stage 3a: Secondary | ICD-10-CM | POA: Diagnosis present

## 2021-01-07 DIAGNOSIS — E1122 Type 2 diabetes mellitus with diabetic chronic kidney disease: Secondary | ICD-10-CM | POA: Diagnosis present

## 2021-01-07 DIAGNOSIS — Z79899 Other long term (current) drug therapy: Secondary | ICD-10-CM | POA: Diagnosis not present

## 2021-01-07 DIAGNOSIS — J45901 Unspecified asthma with (acute) exacerbation: Secondary | ICD-10-CM | POA: Diagnosis not present

## 2021-01-07 DIAGNOSIS — J123 Human metapneumovirus pneumonia: Secondary | ICD-10-CM | POA: Diagnosis present

## 2021-01-07 DIAGNOSIS — E042 Nontoxic multinodular goiter: Secondary | ICD-10-CM | POA: Diagnosis present

## 2021-01-07 DIAGNOSIS — Z7982 Long term (current) use of aspirin: Secondary | ICD-10-CM | POA: Diagnosis not present

## 2021-01-07 DIAGNOSIS — J441 Chronic obstructive pulmonary disease with (acute) exacerbation: Secondary | ICD-10-CM | POA: Diagnosis present

## 2021-01-07 DIAGNOSIS — E7800 Pure hypercholesterolemia, unspecified: Secondary | ICD-10-CM | POA: Diagnosis present

## 2021-01-07 LAB — RESPIRATORY PANEL BY PCR

## 2021-01-07 LAB — CBC
HCT: 32.3 % — ABNORMAL LOW (ref 36.0–46.0)
Hemoglobin: 10.2 g/dL — ABNORMAL LOW (ref 12.0–15.0)
MCH: 29.3 pg (ref 26.0–34.0)
MCHC: 31.6 g/dL (ref 30.0–36.0)
MCV: 92.8 fL (ref 80.0–100.0)
Platelets: 249 10*3/uL (ref 150–400)
RBC: 3.48 MIL/uL — ABNORMAL LOW (ref 3.87–5.11)
RDW: 13.4 % (ref 11.5–15.5)
WBC: 6.8 10*3/uL (ref 4.0–10.5)
nRBC: 0 % (ref 0.0–0.2)

## 2021-01-07 LAB — ECHOCARDIOGRAM COMPLETE
AR max vel: 2.48 cm2
AV Area VTI: 2.39 cm2
AV Area mean vel: 2.36 cm2
AV Mean grad: 9 mmHg
AV Peak grad: 15.7 mmHg
Ao pk vel: 1.98 m/s
Area-P 1/2: 6.27 cm2
Calc EF: 50 %
Height: 66.5 in
MV VTI: 3.28 cm2
Single Plane A2C EF: 43 %
Single Plane A4C EF: 46.9 %
Weight: 4229.3 oz

## 2021-01-07 LAB — BASIC METABOLIC PANEL
Anion gap: 8 (ref 5–15)
BUN: 33 mg/dL — ABNORMAL HIGH (ref 8–23)
CO2: 24 mmol/L (ref 22–32)
Calcium: 8.3 mg/dL — ABNORMAL LOW (ref 8.9–10.3)
Chloride: 105 mmol/L (ref 98–111)
Creatinine, Ser: 1.84 mg/dL — ABNORMAL HIGH (ref 0.44–1.00)
GFR, Estimated: 27 mL/min — ABNORMAL LOW (ref >60)
Glucose, Bld: 257 mg/dL — ABNORMAL HIGH (ref 70–99)
Potassium: 4.5 mmol/L (ref 3.5–5.1)
Sodium: 137 mmol/L (ref 135–145)

## 2021-01-07 LAB — GLUCOSE, CAPILLARY
Glucose-Capillary: 273 mg/dL — ABNORMAL HIGH (ref 70–99)
Glucose-Capillary: 240 mg/dL — ABNORMAL HIGH (ref 70–99)

## 2021-01-07 LAB — D-DIMER, QUANTITATIVE: D-Dimer, Quant: 1.92 ug/mL-FEU — ABNORMAL HIGH (ref 0.00–0.50)

## 2021-01-07 MED ORDER — ONDANSETRON HCL 4 MG PO TABS: 4.0000 mg | ORAL_TABLET | Freq: Four times a day (QID) | ORAL | Status: DC | PRN

## 2021-01-07 MED ORDER — ASCORBIC ACID 500 MG PO TABS
250.0000 mg | ORAL_TABLET | Freq: Every day | ORAL | Status: DC
  Administered 2021-01-07 – 2021-01-23 (×17): 250 mg via ORAL
  Filled 2021-01-07 (×18): qty 1

## 2021-01-07 MED ORDER — TRAMADOL HCL 50 MG PO TABS: 50.0000 mg | ORAL_TABLET | Freq: Four times a day (QID) | ORAL | Status: DC | PRN

## 2021-01-07 MED ORDER — ONDANSETRON HCL 4 MG/2ML IJ SOLN: 4.0000 mg | Freq: Four times a day (QID) | INTRAMUSCULAR | Status: DC | PRN

## 2021-01-07 MED ORDER — SIMVASTATIN 20 MG PO TABS
20.0000 mg | ORAL_TABLET | Freq: Every day | ORAL | Status: DC
  Administered 2021-01-07 – 2021-01-22 (×17): 20 mg via ORAL
  Filled 2021-01-07: qty 1
  Filled 2021-01-07: qty 2
  Filled 2021-01-07 (×15): qty 1

## 2021-01-07 MED ORDER — BENAZEPRIL HCL 20 MG PO TABS
20.0000 mg | ORAL_TABLET | Freq: Two times a day (BID) | ORAL | Status: DC
  Administered 2021-01-07 – 2021-01-14 (×15): 20 mg via ORAL
  Filled 2021-01-07 (×18): qty 1

## 2021-01-07 MED ORDER — IPRATROPIUM BROMIDE 0.02 % IN SOLN
0.5000 mg | RESPIRATORY_TRACT | Status: DC
Start: 2021-01-07 — End: 2021-01-09
  Administered 2021-01-07 – 2021-01-09 (×11): 0.5 mg via RESPIRATORY_TRACT
  Filled 2021-01-07 (×14): qty 2.5

## 2021-01-07 MED ORDER — METHYLPREDNISOLONE SODIUM SUCC 40 MG IJ SOLR
40.0000 mg | Freq: Two times a day (BID) | INTRAMUSCULAR | Status: DC
  Administered 2021-01-07 – 2021-01-11 (×9): 40 mg via INTRAVENOUS
  Filled 2021-01-07 (×7): qty 1

## 2021-01-07 MED ORDER — INSULIN ASPART 100 UNIT/ML IJ SOLN
0.0000 [IU] | Freq: Every day | INTRAMUSCULAR | Status: DC
  Administered 2021-01-07 – 2021-01-20 (×6): 2 [IU] via SUBCUTANEOUS
  Administered 2021-01-21: 22:00:00 4 [IU] via SUBCUTANEOUS
  Filled 2021-01-07 (×7): qty 1

## 2021-01-07 MED ORDER — AMLODIPINE BESYLATE 10 MG PO TABS
10.0000 mg | ORAL_TABLET | Freq: Every day | ORAL | Status: DC
  Administered 2021-01-07 – 2021-01-21 (×15): 10 mg via ORAL
  Filled 2021-01-07: qty 2
  Filled 2021-01-07 (×9): qty 1
  Filled 2021-01-07: qty 2
  Filled 2021-01-07 (×5): qty 1

## 2021-01-07 MED ORDER — MOMETASONE FURO-FORMOTEROL FUM 200-5 MCG/ACT IN AERO: 2.0000 | INHALATION_SPRAY | Freq: Two times a day (BID) | RESPIRATORY_TRACT | Status: DC

## 2021-01-07 MED ORDER — IPRATROPIUM-ALBUTEROL 0.5-2.5 (3) MG/3ML IN SOLN: 3.0000 mL | Freq: Four times a day (QID) | RESPIRATORY_TRACT | Status: DC | PRN

## 2021-01-07 MED ORDER — LEVALBUTEROL HCL 0.63 MG/3ML IN NEBU
0.6300 mg | INHALATION_SOLUTION | RESPIRATORY_TRACT | Status: DC
Start: 2021-01-07 — End: 2021-01-09
  Administered 2021-01-07 – 2021-01-09 (×12): 0.63 mg via RESPIRATORY_TRACT
  Filled 2021-01-07 (×17): qty 3

## 2021-01-07 MED ORDER — GUAIFENESIN ER 600 MG PO TB12
600.0000 mg | ORAL_TABLET | Freq: Two times a day (BID) | ORAL | Status: DC | PRN
  Administered 2021-01-07 – 2021-01-08 (×2): 600 mg via ORAL
  Filled 2021-01-07 (×2): qty 1

## 2021-01-07 MED ORDER — INSULIN ASPART 100 UNIT/ML IJ SOLN
0.0000 [IU] | Freq: Three times a day (TID) | INTRAMUSCULAR | Status: DC
  Administered 2021-01-08: 5 [IU] via SUBCUTANEOUS
  Administered 2021-01-08: 8 [IU] via SUBCUTANEOUS
  Administered 2021-01-08: 3 [IU] via SUBCUTANEOUS
  Administered 2021-01-09 (×2): 8 [IU] via SUBCUTANEOUS
  Administered 2021-01-09: 5 [IU] via SUBCUTANEOUS
  Administered 2021-01-10: 3 [IU] via SUBCUTANEOUS
  Administered 2021-01-10: 08:00:00 8 [IU] via SUBCUTANEOUS
  Administered 2021-01-10: 18:00:00 11 [IU] via SUBCUTANEOUS
  Administered 2021-01-11: 18:00:00 3 [IU] via SUBCUTANEOUS
  Administered 2021-01-11: 09:00:00 11 [IU] via SUBCUTANEOUS
  Administered 2021-01-11: 13:00:00 8 [IU] via SUBCUTANEOUS
  Administered 2021-01-12: 17:00:00 15 [IU] via SUBCUTANEOUS
  Administered 2021-01-12: 3 [IU] via SUBCUTANEOUS
  Administered 2021-01-12: 13:00:00 15 [IU] via SUBCUTANEOUS
  Administered 2021-01-13: 09:00:00 3 [IU] via SUBCUTANEOUS
  Administered 2021-01-13: 13:00:00 5 [IU] via SUBCUTANEOUS
  Administered 2021-01-13: 17:00:00 8 [IU] via SUBCUTANEOUS
  Administered 2021-01-14 (×2): 11 [IU] via SUBCUTANEOUS
  Administered 2021-01-15: 3 [IU] via SUBCUTANEOUS
  Administered 2021-01-15: 17:00:00 5 [IU] via SUBCUTANEOUS
  Administered 2021-01-16: 2 [IU] via SUBCUTANEOUS
  Administered 2021-01-16: 3 [IU] via SUBCUTANEOUS
  Administered 2021-01-17 – 2021-01-18 (×2): 8 [IU] via SUBCUTANEOUS
  Administered 2021-01-18: 10:00:00 2 [IU] via SUBCUTANEOUS
  Administered 2021-01-18: 14:00:00 3 [IU] via SUBCUTANEOUS
  Administered 2021-01-19: 13:00:00 5 [IU] via SUBCUTANEOUS
  Administered 2021-01-19: 3 [IU] via SUBCUTANEOUS
  Administered 2021-01-19: 11 [IU] via SUBCUTANEOUS
  Administered 2021-01-20 – 2021-01-22 (×3): 5 [IU] via SUBCUTANEOUS
  Administered 2021-01-23: 3 [IU] via SUBCUTANEOUS
  Administered 2021-01-23: 09:00:00 2 [IU] via SUBCUTANEOUS
  Filled 2021-01-07 (×34): qty 1

## 2021-01-07 MED ORDER — ENSURE MAX PROTEIN PO LIQD
11.0000 [oz_av] | Freq: Every day | ORAL | Status: DC
  Administered 2021-01-07 – 2021-01-20 (×8): 11 [oz_av] via ORAL
  Filled 2021-01-07: qty 330

## 2021-01-07 MED ORDER — ENOXAPARIN SODIUM 40 MG/0.4ML IJ SOSY
40.0000 mg | PREFILLED_SYRINGE | INTRAMUSCULAR | Status: DC
  Administered 2021-01-07 – 2021-01-12 (×6): 40 mg via SUBCUTANEOUS
  Filled 2021-01-07 (×6): qty 0.4

## 2021-01-07 MED ORDER — HYDROCHLOROTHIAZIDE 12.5 MG PO TABS
12.5000 mg | ORAL_TABLET | Freq: Two times a day (BID) | ORAL | Status: DC
  Administered 2021-01-07 – 2021-01-16 (×20): 12.5 mg via ORAL
  Filled 2021-01-07 (×20): qty 1

## 2021-01-07 MED ORDER — HYDRALAZINE HCL 20 MG/ML IJ SOLN
5.0000 mg | INTRAMUSCULAR | Status: DC | PRN
  Administered 2021-01-10: 18:00:00 5 mg via INTRAVENOUS
  Filled 2021-01-07: qty 1

## 2021-01-07 MED ORDER — GLUCERNA SHAKE PO LIQD
237.0000 mL | Freq: Two times a day (BID) | ORAL | Status: DC
  Administered 2021-01-08 – 2021-01-12 (×9): 237 mL via ORAL

## 2021-01-07 MED ORDER — BENZONATATE 100 MG PO CAPS
100.0000 mg | ORAL_CAPSULE | Freq: Three times a day (TID) | ORAL | Status: DC | PRN
  Administered 2021-01-08: 100 mg via ORAL
  Filled 2021-01-07: qty 1

## 2021-01-07 MED ORDER — ADULT MULTIVITAMIN W/MINERALS CH
1.0000 | ORAL_TABLET | Freq: Every day | ORAL | Status: DC
  Administered 2021-01-07 – 2021-01-23 (×17): 1 via ORAL
  Filled 2021-01-07 (×17): qty 1

## 2021-01-07 MED ORDER — ACETAMINOPHEN 325 MG PO TABS
650.0000 mg | ORAL_TABLET | Freq: Four times a day (QID) | ORAL | Status: DC | PRN
  Administered 2021-01-15: 650 mg via ORAL
  Filled 2021-01-07 (×2): qty 2

## 2021-01-07 MED ORDER — ACETAMINOPHEN 650 MG RE SUPP: 650.0000 mg | Freq: Four times a day (QID) | RECTAL | Status: DC | PRN

## 2021-01-07 NOTE — Progress Notes (Signed)
*  PRELIMINARY RESULTS* Echocardiogram 2D Echocardiogram has been performed.  Brandi Hoover 01/07/2021, 10:36 AM

## 2021-01-07 NOTE — Progress Notes (Addendum)
Pt still coughing even after mucinex tablet prn. NP Randol Kern made aware. Will continue to monitor.  Update 2114: NP placed order. Will contineu to monitor.  Update 2125: Staff Rn requested pt to bring advair from home and states will call family in the morning. Pt has an allergy to dulera. Will continue to monitor.

## 2021-01-07 NOTE — Progress Notes (Signed)
PROGRESS NOTE    Brandi Hoover  IEP:[id number] DOB: 02-02-1941 DOA: 01/06/2021 PCP: Harrel Lemon, MD  124A/[id number]-AA   Assessment & Plan:   Principal Problem:   Respiratory failure with hypoxia ([place]) Active Problems:   COPD (chronic obstructive pulmonary disease) ([place])   Anemia   Hypertension   Multinodular goiter   Stage 3b chronic kidney disease ([place])   Type 2 diabetes mellitus with stage 3b chronic kidney disease, without long-term current use of insulin (HCC)   Acute respiratory failure with hypoxia (HCC)   Brandi Hoover is a 80 y.o. female with hx of asthma/COPD, diabetes mellitus type 2, hypertension, obesity who presented with complaints of shortness of breath and wheezing for the past few days.  Patient stated that her daughter has been ill with some respiratory illness.  # Acute hypoxemic respiratory failure 2/2 Metapneumovirus PNA --needing up to 7L high-flow [place] to maintain O2 sats in low 90's. --CXR relatively clear, no fever, no leukocytosis, no strong evidence of bacterial PNA. PLAN:  --Continue supplemental O2 to keep sats between 88-92%, wean as tolerated  # Asthma/COPD exacerbation --Pt had both dx in medical records.  --presented with significant dyspnea, rhonchi and wheezes, increased sputum production, and hypoxemia.  Exacerbation triggered by viral PNA. --started on IV solumedrol 40 mg q12h Plan: --cont IV solumedrol 40 mg q12h for now  --schedule ipratropium and Xopenex nebs q4h --cont home Advair as Dulera  HTN --cont amlodipine, benazepril and HCTZ  CKD 3A --baseline around 1.5-1.6.    DM2 --pt takes metformin, glipizide and JANUVIA at home. --hold home agents --ACHS and SSI for now   DVT prophylaxis: Lovenox SQ Code Status: Full code pt confirmed she would want intubation. Family Communication: daughter updated on the phone today  Level of care: Telemetry Medical Dispo:   The patient is from: home Anticipated d/c is to:  home Anticipated d/c date is: > 3 days Patient currently is not medically ready to d/c due to: 7L new O2 requirement   Subjective and Interval History:  Pt denied chest pain, leg swelling.  Reported having sputum that was difficult to bring up.  Oxygen requirement increased up to 8L.  RVP pos for metapneumovirus.     Objective: Vitals:   01/07/21 1523 01/07/21 1600 01/07/21 1749 01/07/21 1920  BP: (!) 159/70 134/70 (!) 159/80   Pulse: (!) 116 100 (!) 59   Resp: 20 (!) 24 20   Temp: 98.1 F (36.7 C) 98.3 F (36.8 C) 98.9 F (37.2 C)   TempSrc: Oral Oral    SpO2: 90% 91% 93% 92%  Weight:      Height:       No intake or output data in the 24 hours ending 01/07/21 1946 Filed Weights   01/06/21 1205  Weight: 119.9 kg    Examination:   Constitutional: NAD, AAOx3 HEENT: conjunctivae and lids normal, EOMI CV: No cyanosis.   RESP: increased respiratory effort and RR, diffuse rhonchi, on 5L sating 88% Extremities: No effusions, edema in BLE SKIN: warm, dry Neuro: II - XII grossly intact.   Psych: Normal mood and affect.  Appropriate judgement and reason   Data Reviewed: I have personally reviewed following labs and imaging studies  CBC: Recent Labs  Lab 01/06/21 1209 01/07/21 0647  WBC 7.9 6.8  HGB 10.5* 10.2*  HCT 32.7* 32.3*  MCV 92.4 92.8  PLT 262 660   Basic Metabolic Panel: Recent Labs  Lab 01/06/21 1209 01/07/21 0647  NA 137 137  K 3.7 4.5  CL 104 105  CO2 25 24  GLUCOSE 201* 257*  BUN 28* 33*  CREATININE 1.72* 1.84*  CALCIUM 8.6* 8.3*   GFR: Estimated Creatinine Clearance: 31.4 mL/min (A) (by C-G formula based on SCr of 1.84 mg/dL (H)). Liver Function Tests: No results for input(s): AST, ALT, ALKPHOS, BILITOT, PROT, ALBUMIN in the last 168 hours. No results for input(s): LIPASE, AMYLASE in the last 168 hours. No results for input(s): AMMONIA in the last 168 hours. Coagulation Profile: No results for input(s): INR, PROTIME in the last 168  hours. Cardiac Enzymes: No results for input(s): CKTOTAL, CKMB, CKMBINDEX, TROPONINI in the last 168 hours. BNP (last 3 results) No results for input(s): PROBNP in the last 8760 hours. HbA1C: No results for input(s): HGBA1C in the last 72 hours. CBG: Recent Labs  Lab 01/07/21 1800  GLUCAP 273*   Lipid Profile: No results for input(s): CHOL, HDL, LDLCALC, TRIG, CHOLHDL, LDLDIRECT in the last 72 hours. Thyroid Function Tests: No results for input(s): TSH, T4TOTAL, FREET4, T3FREE, THYROIDAB in the last 72 hours. Anemia Panel: No results for input(s): VITAMINB12, FOLATE, FERRITIN, TIBC, IRON, RETICCTPCT in the last 72 hours. Sepsis Labs: No results for input(s): PROCALCITON, LATICACIDVEN in the last 168 hours.  Recent Results (from the past 240 hour(s))  Resp Panel by RT-PCR (Flu A&B, Covid) Nasopharyngeal Swab     Status: None   Collection Time: 01/06/21  5:37 PM   Specimen: Nasopharyngeal Swab; Nasopharyngeal(NP) swabs in vial transport medium  Result Value Ref Range Status   SARS Coronavirus 2 by RT PCR NEGATIVE NEGATIVE Final    Comment: (NOTE) SARS-CoV-2 target nucleic acids are NOT DETECTED.  The SARS-CoV-2 RNA is generally detectable in upper respiratory specimens during the acute phase of infection. The lowest concentration of SARS-CoV-2 viral copies this assay can detect is 138 copies/mL. A negative result does not preclude SARS-Cov-2 infection and should not be used as the sole basis for treatment or other patient management decisions. A negative result may occur with  improper specimen collection/handling, submission of specimen other than nasopharyngeal swab, presence of viral mutation(s) within the areas targeted by this assay, and inadequate number of viral copies(<138 copies/mL). A negative result must be combined with clinical observations, patient history, and epidemiological information. The expected result is Negative.  Fact Sheet for Patients:   EntrepreneurPulse.com.au  Fact Sheet for Healthcare Providers:  IncredibleEmployment.be  This test is no t yet approved or cleared by the [place] FDA and  has been authorized for detection and/or diagnosis of SARS-CoV-2 by FDA under an Emergency Use Authorization (EUA). This EUA will remain  in effect (meaning this test can be used) for the duration of the COVID-19 declaration under Section 564(b)(1) of the Act, 21 U.S.C.section 360bbb-3(b)(1), unless the authorization is terminated  or revoked sooner.       Influenza A by PCR NEGATIVE NEGATIVE Final   Influenza B by PCR NEGATIVE NEGATIVE Final    Comment: (NOTE) The Xpert Xpress SARS-CoV-2/FLU/RSV plus assay is intended as an aid in the diagnosis of influenza from Nasopharyngeal swab specimens and should not be used as a sole basis for treatment. Nasal washings and aspirates are unacceptable for Xpert Xpress SARS-CoV-2/FLU/RSV testing.  Fact Sheet for Patients: EntrepreneurPulse.com.au  Fact Sheet for Healthcare Providers: IncredibleEmployment.be  This test is not yet approved or cleared by the [place] FDA and has been authorized for detection and/or diagnosis of SARS-CoV-2 by FDA under an Emergency Use Authorization (EUA). This EUA  will remain in effect (meaning this test can be used) for the duration of the COVID-19 declaration under Section 564(b)(1) of the Act, 21 U.S.C. section 360bbb-3(b)(1), unless the authorization is terminated or revoked.  Performed at [facility], [address] [place], [place] [address]   Respiratory (~20 pathogens) panel by PCR     Status: Abnormal   Collection Time: 01/06/21  5:37 PM   Specimen: Nasopharyngeal Swab; Respiratory  Result Value Ref Range Status   Adenovirus NOT DETECTED NOT DETECTED Final   Coronavirus 229E NOT DETECTED NOT DETECTED Final    Comment: (NOTE) The Coronavirus on  the Respiratory Panel, DOES NOT test for the novel  Coronavirus (2019 nCoV)    Coronavirus HKU1 NOT DETECTED NOT DETECTED Final   Coronavirus NL63 NOT DETECTED NOT DETECTED Final   Coronavirus OC43 NOT DETECTED NOT DETECTED Final   Metapneumovirus DETECTED (A) NOT DETECTED Final   Rhinovirus / Enterovirus NOT DETECTED NOT DETECTED Final   Influenza A NOT DETECTED NOT DETECTED Final   Influenza B NOT DETECTED NOT DETECTED Final   Parainfluenza Virus 1 NOT DETECTED NOT DETECTED Final   Parainfluenza Virus 2 NOT DETECTED NOT DETECTED Final   Parainfluenza Virus 3 NOT DETECTED NOT DETECTED Final   Parainfluenza Virus 4 NOT DETECTED NOT DETECTED Final   Respiratory Syncytial Virus NOT DETECTED NOT DETECTED Final   Bordetella pertussis NOT DETECTED NOT DETECTED Final   Bordetella Parapertussis NOT DETECTED NOT DETECTED Final   Chlamydophila pneumoniae NOT DETECTED NOT DETECTED Final   Mycoplasma pneumoniae NOT DETECTED NOT DETECTED Final    Comment: Performed at [facility], [address] [place], [place] [address]      Radiology Studies: DG Chest 2 View  Result Date: 01/06/2021 CLINICAL DATA:  Shortness of breath. EXAM: CHEST - 2 VIEW COMPARISON:  Jul 04, 2019. FINDINGS: Stable cardiomediastinal silhouette. Minimal bibasilar subsegmental atelectasis is noted. Bony thorax is unremarkable. IMPRESSION: Minimal bibasilar subsegmental atelectasis. Electronically Signed   By: Marijo Conception M.D.   On: 01/06/2021 13:26   ECHOCARDIOGRAM COMPLETE  Result Date: 01/07/2021    ECHOCARDIOGRAM REPORT   Patient Name:   Brandi Hoover Date of Exam: 01/07/2021 Medical Rec #:  [id number]     Height:       66.5 in Accession #:    [id number]    Weight:       264.3 lb Date of Birth:  [birth year]     BSA:          2.264 m Patient Age:    34 years      BP:           146/74 mmHg Patient Gender: F             HR:           115 bpm. Exam Location:  [facility] Procedure: 2D Echo, Color Doppler and Cardiac Doppler  Indications:     I50.31 congestive heart failure-Acute Diastolic  History:         Patient has no prior history of Echocardiogram examinations.                  Risk Factors:Hypertension, Diabetes and HCL.  Sonographer:     Charmayne Sheer Referring Phys:  [id number] Gretta Cool PATEL Diagnosing Phys: Kate Sable MD  Sonographer Comments: Suboptimal parasternal window and suboptimal subcostal window. Image acquisition challenging due to patient body habitus. IMPRESSIONS  1. Left ventricular ejection fraction, by estimation, is 55%.  The left ventricle has normal function. Left ventricular endocardial border not optimally defined to evaluate regional wall motion. Left ventricular diastolic parameters are indeterminate.  2. Right ventricular systolic function is normal. The right ventricular size is normal.  3. The mitral valve is normal in structure. No evidence of mitral valve regurgitation.  4. The aortic valve was not well visualized. Aortic valve regurgitation is not visualized.  5. The inferior vena cava is normal in size with greater than 50% respiratory variability, suggesting right atrial pressure of 3 mmHg. FINDINGS  Left Ventricle: Left ventricular ejection fraction, by estimation, is 55%. The left ventricle has normal function. Left ventricular endocardial border not optimally defined to evaluate regional wall motion. The global longitudinal strain is normal despite suboptimal segment tracking. The left ventricular internal cavity size was normal in size. There is no left ventricular hypertrophy. Left ventricular diastolic parameters are indeterminate. Right Ventricle: The right ventricular size is normal. No increase in right ventricular wall thickness. Right ventricular systolic function is normal. Left Atrium: Left atrial size was normal in size. Right Atrium: Right atrial size was not well visualized. Pericardium: There is no evidence of pericardial effusion. Mitral Valve: The mitral valve is normal in  structure. No evidence of mitral valve regurgitation. MV peak gradient, 13.8 mmHg. The mean mitral valve gradient is 7.0 mmHg. Tricuspid Valve: The tricuspid valve is not well visualized. Tricuspid valve regurgitation is not demonstrated. Aortic Valve: The aortic valve was not well visualized. Aortic valve regurgitation is not visualized. Aortic valve mean gradient measures 9.0 mmHg. Aortic valve peak gradient measures 15.7 mmHg. Aortic valve area, by VTI measures 2.39 cm. Pulmonic Valve: The pulmonic valve was normal in structure. Pulmonic valve regurgitation is not visualized. Aorta: The aortic root is normal in size and structure. Venous: The inferior vena cava is normal in size with greater than 50% respiratory variability, suggesting right atrial pressure of 3 mmHg. IAS/Shunts: No atrial level shunt detected by color flow Doppler.  LEFT VENTRICLE PLAX 2D LVOT diam:     2.10 cm      Diastology LV SV:         93           LV e' medial:    13.20 cm/s LV SV Index:   41           LV E/e' medial:  12.4 LVOT Area:     3.46 cm     LV e' lateral:   7.40 cm/s                             LV E/e' lateral: 22.2  LV Volumes (MOD) LV vol d, MOD A2C: 105.0 ml LV vol d, MOD A4C: 122.0 ml LV vol s, MOD A2C: 59.9 ml LV vol s, MOD A4C: 64.8 ml LV SV MOD A2C:     45.1 ml LV SV MOD A4C:     122.0 ml LV SV MOD BP:      62.1 ml LEFT ATRIUM           Index LA Vol (A4C): 72.4 ml 31.98 ml/m  AORTIC VALVE                     PULMONIC VALVE AV Area (Vmax):    2.48 cm      PV Vmax:       1.10 m/s AV Area (Vmean):   2.36 cm  PV Vmean:      80.500 cm/s AV Area (VTI):     2.39 cm      PV VTI:        0.229 m AV Vmax:           198.00 cm/s   PV Peak grad:  4.8 mmHg AV Vmean:          142.000 cm/s  PV Mean grad:  3.0 mmHg AV VTI:            0.390 m AV Peak Grad:      15.7 mmHg AV Mean Grad:      9.0 mmHg LVOT Vmax:         142.00 cm/s LVOT Vmean:        96.700 cm/s LVOT VTI:          0.269 m LVOT/AV VTI ratio: 0.69  AORTA Ao Root diam:  2.70 cm MITRAL VALVE MV Area (PHT): 6.27 cm     SHUNTS MV Area VTI:   3.28 cm     Systemic VTI:  0.27 m MV Peak grad:  13.8 mmHg    Systemic Diam: 2.10 cm MV Mean grad:  7.0 mmHg MV Vmax:       1.86 m/s MV Vmean:      123.0 cm/s MV Decel Time: 121 msec MV E velocity: 164.20 cm/s MV A velocity: 142.00 cm/s MV E/A ratio:  1.16 Kate Sable MD Electronically signed by Kate Sable MD Signature Date/Time: 01/07/2021/5:24:31 PM    Final      Scheduled Meds:  amLODipine  10 mg Oral Daily   ascorbic acid  250 mg Oral Daily   benazepril  20 mg Oral BID   feeding supplement (GLUCERNA SHAKE)  237 mL Oral BID BM   hydrochlorothiazide  12.5 mg Oral BID   insulin aspart  0-15 Units Subcutaneous TID WC   insulin aspart  0-5 Units Subcutaneous QHS   ipratropium  0.5 mg Nebulization Q4H   levalbuterol  0.63 mg Nebulization Q4H   methylPREDNISolone (SOLU-MEDROL) injection  40 mg Intravenous Q12H   mometasone-formoterol  2 puff Inhalation BID   multivitamin with minerals  1 tablet Oral Daily   Ensure Max Protein  11 oz Oral QHS   simvastatin  20 mg Oral QHS   Continuous Infusions:   LOS: 0 days     Enzo Bi, MD [facility] If 7PM-7AM, please contact night-coverage 01/07/2021, 7:46 PM

## 2021-01-07 NOTE — Progress Notes (Signed)
Initial Nutrition Assessment  DOCUMENTATION CODES:   Morbid obesity  INTERVENTION:   -Change diet to carb modified -Glucerna Shake po BID, each supplement provides 220 kcal and 10 grams of protein  -Ensure Max po daily, each supplement provides 150 kcal and 30 grams of protein -MVI with minerals daily   NUTRITION DIAGNOSIS:   Increased nutrient needs related to chronic illness (COPD) as evidenced by estimated needs.  GOAL:   Patient will meet greater than or equal to 90% of their needs  MONITOR:   PO intake, Supplement acceptance, Labs, Weight trends, Skin, I & O's  REASON FOR ASSESSMENT:   Consult Assessment of nutrition requirement/status  ASSESSMENT:   Brandi Hoover is a 80 y.o. female seen in ed with complaints of shortness of breath and wheezing for the past few days.  Patient states that her daughter has been ill with the flu and she has been exposed.  She denies any fevers chills chest pain palpitations headaches blurred vision speech or gait issues at baseline she states that she can keep her house clean and take care of herself her kitchen her home.  But right now she cannot.  Patient also denies any fevers chills abdominal pain nausea vomiting diarrhea urinary bladder issues skin or joint issues.  Patient states that she does note in addition to the shortness of breath gets worse when she is walking or when she is laying down and she prefers to sleep sitting up.  Patient reports that her right leg has been swelling for an unknown duration but usually does not get swelling in her legs.  And at the end of encounter patient reports chest discomfort that is with shortness of breath and on exertion.  Pt admitted with respiratory failure with hypoxia and COPD exacerbation.   Pt unavailable at time of visit. RD unable to obtain further nutrition-related history or complete nutrition-focused physical exam at this time.    Pt currently on a heart healthy diet. No meal  completion data available at time of visit.   Reviewed wt hx; wt has been stable.   Obesity is a complex, chronic medical condition that is optimally managed by a multidisciplinary care team. Weight loss is not an ideal goal for an acute inpatient hospitalization. However, if further work-up for obesity is warranted, consider outpatient referral to outpatient bariatric service and/or [place].    Medications reviewed and include vitamin C and solu-medrol.   Lab Results  Component Value Date   HGBA1C 7.8 (H) 07/14/2015   PTA DM medications are 10 mg glipizide BID, 25 mg januvia daily, 1500 mg metformin daily, and 100 mg metformin daily.   Labs reviewed: CBGS: 242 (inpatient orders for glycemic control are none).    Diet Order:   Diet Order             Diet Heart Room service appropriate? Yes; Fluid consistency: Thin  Diet effective now                   EDUCATION NEEDS:   No education needs have been identified at this time  Skin:  Skin Assessment: Reviewed RN Assessment  Last BM:  Unknown  Height:   Ht Readings from Last 1 Encounters:  01/06/21 5' 6.5" (1.689 m)    Weight:   Wt Readings from Last 1 Encounters:  01/06/21 119.9 kg    Ideal Body Weight:  60.2 kg  BMI:  Body mass index is 42.03 kg/m.  Estimated Nutritional Needs:   Kcal:  1900-2100  Protein:  105-120 grams  Fluid:  > 1.9 L    Loistine Chance, RD, LDN, [place] Registered Dietitian II Certified Diabetes Care and Education Specialist Please refer to [facility] for RD and/or RD on-call/weekend/after hours pager

## 2021-01-08 DIAGNOSIS — E042 Nontoxic multinodular goiter: Secondary | ICD-10-CM | POA: Diagnosis not present

## 2021-01-08 DIAGNOSIS — E1122 Type 2 diabetes mellitus with diabetic chronic kidney disease: Secondary | ICD-10-CM

## 2021-01-08 DIAGNOSIS — D649 Anemia, unspecified: Secondary | ICD-10-CM | POA: Diagnosis not present

## 2021-01-08 DIAGNOSIS — N1832 Chronic kidney disease, stage 3b: Secondary | ICD-10-CM

## 2021-01-08 DIAGNOSIS — I10 Essential (primary) hypertension: Secondary | ICD-10-CM | POA: Diagnosis not present

## 2021-01-08 DIAGNOSIS — J9601 Acute respiratory failure with hypoxia: Secondary | ICD-10-CM | POA: Diagnosis not present

## 2021-01-08 LAB — HEMOGLOBIN A1C
Hgb A1c MFr Bld: 7.1 % — ABNORMAL HIGH (ref 4.8–5.6)
Mean Plasma Glucose: 157 mg/dL

## 2021-01-08 LAB — GLUCOSE, CAPILLARY
Glucose-Capillary: 199 mg/dL — ABNORMAL HIGH (ref 70–99)
Glucose-Capillary: 231 mg/dL — ABNORMAL HIGH (ref 70–99)
Glucose-Capillary: 265 mg/dL — ABNORMAL HIGH (ref 70–99)
Glucose-Capillary: 186 mg/dL — ABNORMAL HIGH (ref 70–99)

## 2021-01-08 MED ORDER — FLUTICASONE-SALMETEROL 250-50 MCG/ACT IN AEPB
1.0000 | INHALATION_SPRAY | Freq: Two times a day (BID) | RESPIRATORY_TRACT | Status: DC
  Administered 2021-01-08 – 2021-01-23 (×30): 1 via RESPIRATORY_TRACT

## 2021-01-08 MED ORDER — HYDROCOD POLST-CPM POLST ER 10-8 MG/5ML PO SUER: 5.0000 mL | Freq: Two times a day (BID) | ORAL | Status: DC | PRN

## 2021-01-08 MED ORDER — ORAL CARE MOUTH RINSE
15.0000 mL | Freq: Two times a day (BID) | OROMUCOSAL | Status: DC
  Administered 2021-01-08 – 2021-01-23 (×26): 15 mL via OROMUCOSAL

## 2021-01-08 MED ORDER — NON FORMULARY: 250.0000 ug | Freq: Two times a day (BID) | Status: DC

## 2021-01-08 MED ORDER — GUAIFENESIN-DM 100-10 MG/5ML PO SYRP: 5.0000 mL | ORAL_SOLUTION | ORAL | Status: DC | PRN

## 2021-01-08 NOTE — Progress Notes (Signed)
PROGRESS NOTE  Brandi Hoover GQQ:[id number] DOB: 03-27-1940 DOA: 01/06/2021 PCP: Harrel Lemon, MD  Brief History   Brandi Hoover is a 80 y.o. female with hx of asthma/COPD, diabetes mellitus type 2, hypertension, obesity who presented with complaints of shortness of breath and wheezing for the past few days.  Patient stated that her daughter has been ill with some respiratory illness.  Consultants  None  Procedures  None  Antibiotics   Anti-infectives (From admission, onward)    None      Subjective  The patient is resting comfortably. She continues to require 10L O2 by HFNC. Daughter is at bedside. No new complaints.  Objective   Vitals:  Vitals:   01/08/21 1146 01/08/21 1549  BP: (!) 142/85 (!) 159/80  Pulse: 77 (!) 109  Resp: 19 19  Temp: 97.8 F (36.6 C) 97.6 F (36.4 C)  SpO2: 95% 91%    Exam:  Constitutional:  The patient is awake, alert, and oriented x 3. No acute distress. Respiratory:  No increased work of breathing. No wheezes, rales, or rhonchi No tactile fremitus Cardiovascular:  Regular rate and rhythm No murmurs, ectopy, or gallups. No lateral PMI. No thrills. Abdomen:  Abdomen is soft, non-tender, non-distended No hernias, masses, or organomegaly Normoactive bowel sounds.  Musculoskeletal:  No cyanosis, clubbing, or edema Skin:  No rashes, lesions, ulcers palpation of skin: no induration or nodules Neurologic:  CN 2-12 intact Sensation all 4 extremities intact Psychiatric:  Mental status Mood, affect appropriate Orientation to person, place, time  judgment and insight appear intact   I have personally reviewed the following:   Today's Data  Vitals  Lab Data  CBC, BMP, D Dimer, Glucoses  Micro Data  Viral respiratory panel positive for metapneumovirus  Imaging  CXR  Cardiology Data  EKG  Scheduled Meds:  amLODipine  10 mg Oral Daily   ascorbic acid  250 mg Oral Daily   benazepril  20 mg Oral BID   enoxaparin  (LOVENOX) injection  40 mg Subcutaneous Q24H   feeding supplement (GLUCERNA SHAKE)  237 mL Oral BID BM   fluticasone-salmeterol  1 puff Inhalation BID   hydrochlorothiazide  12.5 mg Oral BID   insulin aspart  0-15 Units Subcutaneous TID WC   insulin aspart  0-5 Units Subcutaneous QHS   ipratropium  0.5 mg Nebulization Q4H   levalbuterol  0.63 mg Nebulization Q4H   mouth rinse  15 mL Mouth Rinse BID   methylPREDNISolone (SOLU-MEDROL) injection  40 mg Intravenous Q12H   multivitamin with minerals  1 tablet Oral Daily   Ensure Max Protein  11 oz Oral QHS   simvastatin  20 mg Oral QHS    Principal Problem:   Respiratory failure with hypoxia (HCC) Active Problems:   COPD (chronic obstructive pulmonary disease) (HCC)   Anemia   Hypertension   Multinodular goiter   Stage 3b chronic kidney disease (HCC)   Type 2 diabetes mellitus with stage 3b chronic kidney disease, without long-term current use of insulin (HCC)   Acute respiratory failure with hypoxia (HCC)   LOS: 1 day    A & P  # Acute hypoxemic respiratory failure 2/2 Metapneumovirus PNA --needing up to 7L high-flow [place] to maintain O2 sats in low 90's. --CXR relatively clear, no fever, no leukocytosis, no strong evidence of bacterial PNA. PLAN:  --Continue supplemental O2 to keep sats between 88-92%, wean as tolerated   # Asthma/COPD exacerbation --Pt had both dx in medical records.  --presented  with significant dyspnea, rhonchi and wheezes, increased sputum production, and hypoxemia.  Exacerbation triggered by viral PNA. --started on IV solumedrol 40 mg q12h Plan: --cont IV solumedrol 40 mg q12h for now  --schedule ipratropium and Xopenex nebs q4h --cont home Advair as Dulera   HTN --cont amlodipine, benazepril and HCTZ   CKD 3A --baseline around 1.5-1.6.     DM2 --pt takes metformin, glipizide and JANUVIA at home. --hold home agents --ACHS and SSI for now  I have seen and examined this patient myself. I have  spent 32 minutes in her evaluation and care.     DVT prophylaxis: Lovenox SQ Code Status: Full code pt confirmed she would want intubation. Family Communication: daughter at bedside   Level of care: Telemetry Medical Dispo:   The patient is from: home Anticipated d/c is to: home Anticipated d/c date is: > 3 days Patient currently is not medically ready to d/c due to: 7L new O2 requirement  Raniah Karan, DO [facility] Direct contact: see [email or web address]  7PM-7AM contact night coverage as above 01/08/2021, 4:57 PM  LOS: 1 day       d

## 2021-01-08 NOTE — Evaluation (Signed)
Occupational Therapy Evaluation Patient Details Name: Brandi Hoover MRN: [id number] DOB: 1941-02-04 Today's Date: 01/08/2021   History of Present Illness Brandi Hoover is an 35yoF who comes to [facility] on 01/06/21 c weakness, SOB, wheezing. PMH: asthma, DM2, HTN, obesity. Pt admitted with ARF c hypoxia. At baseline pt lives alone with 24/5 supervision/asssist from DTR, household AMB c 4WW, no falls in past 6 months.   Clinical Impression   Pt was seen for OT evaluation this date. Prior to hospital admission, pt was living in a 1 story home with ramped entrance, taking sponge baths, and had assist from daughter/family intermittently. Pt received seated on the EOB, 10L HFNC, and requesting to use the bathroom. Pt required MOD A from elevated EOB + VC for hand placement to perform stand pivot transfer to [facility]. +2 for safety. +Time + PLB to support breath recovery after transfer. Pt required MAX A for squat pivot back to EOB and demo'd difficulty maintaining upright while RN provided MAX A for pericare 2/2 poor balance, strength, and SOB. MIN A to return to bed. On HFNC 10L, Desats to mid 80's briefly wiht HR up to 120 with exertion, improving quickly to low 90's and HR in 100's-110's with PLB. Pt instructed in PLB throughout session to support breath recovery. Currently pt demonstrates impairments as described below (See OT problem list) which functionally limit her ability to perform ADL/self-care tasks. Pt would benefit from skilled OT services to address noted impairments and functional limitations (see below for any additional details) in order to maximize safety and independence while minimizing falls risk and caregiver burden. Upon hospital discharge, recommend STR to maximize pt safety and return to PLOF.    Recommendations for follow up therapy are one component of a multi-disciplinary discharge planning process, led by the attending physician.  Recommendations may be updated based on patient status,  additional functional criteria and insurance authorization.   Follow Up Recommendations  Skilled nursing-short term rehab (<3 hours/day)    Assistance Recommended at Discharge Intermittent Supervision/Assistance (sup/assist for mobility, bathing, dressing, toileting)  Functional Status Assessment  Patient has had a recent decline in their functional status and demonstrates the ability to make significant improvements in function in a reasonable and predictable amount of time.  Equipment Recommendations  BSC/3in1    Recommendations for Other Services       Precautions / Restrictions Precautions Precautions: Fall Precaution Comments: monitor O2 sats, HR Restrictions Weight Bearing Restrictions: No      Mobility Bed Mobility Overal bed mobility: Needs Assistance Bed Mobility: Sit to Supine       Sit to supine: Min assist   General bed mobility comments: MIN A for BLE mgt    Transfers Overall transfer level: Needs assistance Equipment used: 1 person hand held assist Transfers: Sit to/from Stand;Bed to chair/wheelchair/BSC Sit to Stand: Mod assist;From elevated surface   Squat pivot transfers: Max assist;+2 safety/equipment Step pivot transfers: Mod assist;+2 safety/equipment;From elevated surface     General transfer comment: MOD A EOB > BSC VC for hand placement, MAX A BSC>EOB and unable to achieve full standing, fearful of falling      Balance Overall balance assessment: Needs assistance Sitting-balance support: No upper extremity supported;Feet supported Sitting balance-Leahy Scale: Good     Standing balance support: Bilateral upper extremity supported;During functional activity Standing balance-Leahy Scale: Zero Standing balance comment: unable to maintain without max A  ADL either performed or assessed with clinical judgement   ADL Overall ADL's : Needs assistance/impaired                                        General ADL Comments: Pt currently requires MAX A for LB ADL tasks from seated position, MAX A (+2 for safety) for ADL transfers, MAX A for pericare in partial stand. Limited by SOB, requiring MAX VC for PLB throughout and increased rest breaks     Vision         Perception     Praxis      Pertinent Vitals/Pain Pain Assessment: No/denies pain     Hand Dominance     Extremity/Trunk Assessment Upper Extremity Assessment Upper Extremity Assessment: Generalized weakness   Lower Extremity Assessment Lower Extremity Assessment: Generalized weakness   Cervical / Trunk Assessment Cervical / Trunk Assessment:  (forward head/shoulders)   Communication Communication Communication: No difficulties   Cognition Arousal/Alertness: Awake/alert Behavior During Therapy: WFL for tasks assessed/performed;Anxious Overall Cognitive Status: Within Functional Limits for tasks assessed                                 General Comments: VC throughout session for PLB     General Comments  On HFNC 10L, Desats to mid 80's briefly wiht HR up to 120 with exertion, improving quickly to low 90's and HR in 100's-110's with PLB    Exercises Other Exercises Other Exercises: Functional ADL transfer training, ECS including PLB and activity pacing   Shoulder Instructions      Home Living Family/patient expects to be discharged to:: Private residence Living Arrangements: Alone Available Help at Discharge: Family (DTR, and SIL helps at time; essentially there; DTR there 24hrs, M-F then home on weekend.) Type of Home: House Home Access: Ramped entrance     Home Layout: One level     Bathroom Shower/Tub: Sponge bathes at baseline;Tub/shower unit;Tub only         Home Equipment: Rollator (4 wheels);Wheelchair - manual;BSC/3in1;Shower seat;Grab bars - tub/shower;Grab bars - toilet          Prior Functioning/Environment Prior Level of Function : History of Falls (last  six months)                        OT Problem List: Decreased strength;Cardiopulmonary status limiting activity;Obesity;Impaired balance (sitting and/or standing);Decreased knowledge of use of DME or AE;Decreased activity tolerance;Decreased safety awareness      OT Treatment/Interventions: Self-care/ADL training;Therapeutic exercise;Therapeutic activities;DME and/or AE instruction;Patient/family education;Balance training;Energy conservation    OT Goals(Current goals can be found in the care plan section) Acute Rehab OT Goals Patient Stated Goal: breathe better OT Goal Formulation: With patient/family Time For Goal Achievement: 01/22/21 Potential to Achieve Goals: Good ADL Goals Pt Will Perform Lower Body Dressing: with min assist;with adaptive equipment;sit to/from stand Pt Will Transfer to Toilet: bedside commode;with min assist;stand pivot transfer (LRAD PRN) Additional ADL Goal #1: Pt will utilize PLB during ADL/mobility requiring PRN VC, 5/5 opportunities.  OT Frequency: Min 2X/week   Barriers to D/C: Decreased caregiver support  dtr has MS and physically unable to provide much assist       Co-evaluation              AM-PAC OT "6 Clicks" Daily Activity  Outcome Measure Help from another person eating meals?: None Help from another person taking care of personal grooming?: None Help from another person toileting, which includes using toliet, bedpan, or urinal?: A Lot Help from another person bathing (including washing, rinsing, drying)?: A Lot Help from another person to put on and taking off regular upper body clothing?: A Little Help from another person to put on and taking off regular lower body clothing?: A Lot 6 Click Score: 17   End of Session Equipment Utilized During Treatment: Gait belt;Oxygen Nurse Communication: Mobility status  Activity Tolerance: Patient tolerated treatment well Patient left: in bed;with call bell/phone within reach;with bed  alarm set;with family/visitor present  OT Visit Diagnosis: Other abnormalities of gait and mobility (R26.89);Muscle weakness (generalized) (M62.81)                Time: 4320-0379 OT Time Calculation (min): 34 min Charges:  OT General Charges $OT Visit: 1 Visit OT Evaluation $OT Eval Moderate Complexity: 1 Mod OT Treatments $Self Care/Home Management : 23-37 mins  Ardeth Perfect., MPH, MS, OTR/L ascom [phone] 01/08/21, 3:11 PM

## 2021-01-08 NOTE — Plan of Care (Signed)
  Problem: Education: Goal: Knowledge of General Education information will improve Description: Including pain rating scale, medication(s)/side effects and non-pharmacologic comfort measures Outcome: Progressing   Problem: Clinical Measurements: Goal: Cardiovascular complication will be avoided Outcome: Progressing   Problem: Elimination: Goal: Will not experience complications related to bowel motility Outcome: Progressing   Problem: Elimination: Goal: Will not experience complications related to urinary retention Outcome: Progressing

## 2021-01-08 NOTE — Progress Notes (Signed)
Nutrition Follow-up  DOCUMENTATION CODES:   Morbid obesity  INTERVENTION:   -Continue Glucerna Shake po BID, each supplement provides 220 kcal and 10 grams of protein  -Continue Ensure Max po daily, each supplement provides 150 kcal and 30 grams of protein -Continue MVI with minerals daily   NUTRITION DIAGNOSIS:   Increased nutrient needs related to chronic illness (COPD) as evidenced by estimated needs.  Ongoing  GOAL:   Patient will meet greater than or equal to 90% of their needs  Progressing   MONITOR:   PO intake, Supplement acceptance, Labs, Weight trends, Skin, I & O's  REASON FOR ASSESSMENT:   Consult Assessment of nutrition requirement/status  ASSESSMENT:   Brandi Hoover is a 80 y.o. female seen in ed with complaints of shortness of breath and wheezing for the past few days.  Patient states that her daughter has been ill with the flu and she has been exposed.  She denies any fevers chills chest pain palpitations headaches blurred vision speech or gait issues at baseline she states that she can keep her house clean and take care of herself her kitchen her home.  But right now she cannot.  Patient also denies any fevers chills abdominal pain nausea vomiting diarrhea urinary bladder issues skin or joint issues.  Patient states that she does note in addition to the shortness of breath gets worse when she is walking or when she is laying down and she prefers to sleep sitting up.  Patient reports that her right leg has been swelling for an unknown duration but usually does not get swelling in her legs.  And at the end of encounter patient reports chest discomfort that is with shortness of breath and on exertion.  Reviewed I/O's: +330 ml x 24 hours   Pt resting quietly at time of visit.   Pt is on a carb modified diet. No meal completion data documented. Pt is taking Glucerna and Ensure Max supplements.   Medications reviewed and include solu-medrol.   Labs reviewed:  CBGS: 199-273 (inpatient orders for glycemic control are 0-5 units insulin aspart daily at bedtime and 0-15 units insulin aspart TID with meals).    NUTRITION - FOCUSED PHYSICAL EXAM:  Flowsheet Row Most Recent Value  Orbital Region No depletion  Upper Arm Region No depletion  Thoracic and Lumbar Region No depletion  Buccal Region No depletion  Temple Region No depletion  Clavicle Bone Region No depletion  Clavicle and Acromion Bone Region No depletion  Scapular Bone Region No depletion  Dorsal Hand No depletion  Patellar Region No depletion  Anterior Thigh Region No depletion  Posterior Calf Region No depletion  Edema (RD Assessment) Mild  Hair Reviewed  Eyes Reviewed  Mouth Reviewed  Skin Reviewed  Nails Reviewed       Diet Order:   Diet Order             Diet Carb Modified Fluid consistency: Thin; Room service appropriate? Yes  Diet effective now                   EDUCATION NEEDS:   No education needs have been identified at this time  Skin:  Skin Assessment: Reviewed RN Assessment  Last BM:  Unknown  Height:   Ht Readings from Last 1 Encounters:  01/06/21 5' 6.5" (1.689 m)    Weight:   Wt Readings from Last 1 Encounters:  01/06/21 119.9 kg    Ideal Body Weight:  60.2 kg  BMI:  Body mass index is 42.03 kg/m.  Estimated Nutritional Needs:   Kcal:  1900-2100  Protein:  105-120 grams  Fluid:  > 1.9 L    Loistine Chance, RD, LDN, [place] Registered Dietitian II Certified Diabetes Care and Education Specialist Please refer to [facility] for RD and/or RD on-call/weekend/after hours pager

## 2021-01-08 NOTE — Evaluation (Signed)
Physical Therapy Evaluation Patient Details Name: Brandi Hoover MRN: [id number] DOB: 1940/04/15 Today's Date: 01/08/2021  History of Present Illness  Brandi Hoover is an 84yoF who comes to [facility] on 01/06/21 c weakness, SOB, wheezing. PMH: asthma, DM2, HTN, obesity. Pt admitted with ARF c hypoxia. At baseline pt lives alone with 24/5 supervision/asssist from DTR, household AMB c 4WW, no falls in past 6 months.  Clinical Impression  Pt admitted with above diagnosis. Pt currently with functional limitations due to the deficits listed below (see "PT Problem List"). Upon entry, pt in bed, awake and agreeable to participate in a limited capacity given her continued dyspnea, now on HFNC 10L/min. DTR is at bedside upon entry, giving pt her Advair and albuterol from home, reports she was given permission to bring these from home by a doctor yesterday- similar situation at a previous admission. HR also elevated throughout session in AF/RVR 100s-110s irrespective of movement. Sats drop intermittently into high 80s%, but improve to 97% highest when at EOB and using nasal breathing. Pt requires minA hand held assist to move to EOB. Not in favor of attempting transfer at present due to continued dyspnea. Pt says she feels good at EOB. The pt is alert, pleasant, interactive, and able to provide info regarding prior level of function, both in tolerance and independence. Patient's performance this date reveals decreased ability, independence, and tolerance in performing all basic mobility required for performance of activities of daily living. Pt requires additional DME, close physical assistance, and cues for safe participate in mobility. Pt will benefit from skilled PT intervention to increase independence and safety with basic mobility in preparation for discharge to the venue listed below.         Recommendations for follow up therapy are one component of a multi-disciplinary discharge planning process, led by the  attending physician.  Recommendations may be updated based on patient status, additional functional criteria and insurance authorization.  Follow Up Recommendations Home health PT    Assistance Recommended at Discharge Intermittent Supervision/Assistance  Functional Status Assessment Patient has had a recent decline in their functional status and demonstrates the ability to make significant improvements in function in a reasonable and predictable amount of time.  Equipment Recommendations  None recommended by PT    Recommendations for Other Services       Precautions / Restrictions Precautions Precautions: Fall Restrictions Weight Bearing Restrictions: No      Mobility  Bed Mobility Overal bed mobility: Needs Assistance Bed Mobility: Supine to Sit     Supine to sit: HOB elevated;Min assist     General bed mobility comments: balanced and steady at EOB, DOE, cues for nasal breathing.    Transfers Overall transfer level:  (deferred due to dyspnea.)                      Ambulation/Gait                  Stairs            Wheelchair Mobility    Modified Rankin (Stroke Patients Only)       Balance Overall balance assessment: Modified Independent;Mild deficits observed, not formally tested                                           Pertinent Vitals/Pain Pain Assessment: No/denies pain    Home  Living Family/patient expects to be discharged to:: Private residence Living Arrangements: Alone Available Help at Discharge: Family (DTR, and SIL helps at time; essentially there; DTR there 24hrs, M-F then home on weekend.) Type of Home: House Home Access: Ramped entrance       Home Layout: One level Home Equipment: Rollator (4 wheels);Wheelchair - manual;BSC/3in1;Shower seat;Grab bars - tub/shower;Grab bars - toilet      Prior Function Prior Level of Function : History of Falls (last six months)                      Hand Dominance        Extremity/Trunk Assessment                Communication      Cognition Arousal/Alertness: Awake/alert Behavior During Therapy: WFL for tasks assessed/performed Overall Cognitive Status: Within Functional Limits for tasks assessed                                          General Comments      Exercises     Assessment/Plan    PT Assessment Patient needs continued PT services  PT Problem List Decreased strength;Decreased activity tolerance;Cardiopulmonary status limiting activity;Decreased mobility;Decreased balance       PT Treatment Interventions DME instruction;Balance training;Gait training;Stair training;Functional mobility training;Therapeutic activities;Therapeutic exercise;Cognitive remediation;Patient/family education    PT Goals (Current goals can be found in the Care Plan section)  Acute Rehab PT Goals Patient Stated Goal: regain baseline mobility without dyspnea/hypoxia PT Goal Formulation: With patient Time For Goal Achievement: 01/22/21 Potential to Achieve Goals: Good    Frequency Min 2X/week   Barriers to discharge        Co-evaluation               AM-PAC PT "6 Clicks" Mobility  Outcome Measure Help needed turning from your back to your side while in a flat bed without using bedrails?: A Lot Help needed moving from lying on your back to sitting on the side of a flat bed without using bedrails?: A Lot Help needed moving to and from a bed to a chair (including a wheelchair)?: A Lot Help needed standing up from a chair using your arms (e.g., wheelchair or bedside chair)?: A Lot Help needed to walk in hospital room?: A Lot Help needed climbing 3-5 steps with a railing? : Total 6 Click Score: 11    End of Session Equipment Utilized During Treatment: Oxygen Activity Tolerance: Patient tolerated treatment well;No increased pain;Treatment limited secondary to medical complications (Comment) Patient  left: in bed;with family/visitor present;with call bell/phone within reach Nurse Communication: Mobility status PT Visit Diagnosis: Difficulty in walking, not elsewhere classified (R26.2);Other abnormalities of gait and mobility (R26.89);Muscle weakness (generalized) (M62.81)    Time: 6503-5465 PT Time Calculation (min) (ACUTE ONLY): 38 min   Charges:   PT Evaluation $PT Eval Moderate Complexity: 1 Mod PT Treatments $Therapeutic Activity: 8-22 mins       11:20 AM, 01/08/21 Etta Grandchild, PT, DPT Physical Therapist - [facility]  [phone] ([place])    [place] C 01/08/2021, 11:09 AM

## 2021-01-08 NOTE — TOC Initial Note (Signed)
Transition of Care [facility]) - Initial/Assessment Note    Patient Details  Name: Brandi Hoover MRN: [id number] Date of Birth: [birth year]  [facility] CM/SW Contact:    Pete Pelt, RN Phone Number: 01/08/2021, 3:15 PM  Clinical Narrative:    Spoke to patient's daughter.  Patient lives at home alone, daughter lives in [place], [place] and is afflicted with a medical condition that limits the assistance she can provide her mother.  Patient does not favor living with daughter but there is a potential plan for this down the road (daughter will need to do some work on her home).  Patient has                Expected Discharge Plan:  (Home health vs snf) Barriers to Discharge: Continued Medical Work up   Patient Goals and CMS Choice        Expected Discharge Plan and Services Expected Discharge Plan:  (Home health vs snf)   Discharge Planning Services: CM Consult   Living arrangements for the past 2 months: [place]                                      Prior Living Arrangements/Services Living arrangements for the past 2 months: [place] Lives with:: Self Patient language and need for interpreter reviewed:: Yes (No interpreter required)        Need for Family Participation in Patient Care: Yes (Comment) Care giver support system in place?: Yes (comment)   Criminal Activity/Legal Involvement Pertinent to Current Situation/Hospitalization: No - Comment as needed  Activities of Daily Living Home Assistive Devices/Equipment: Eyeglasses, Dentures (specify type), Walker (specify type) ADL Screening (condition at time of admission) Patient's cognitive ability adequate to safely complete daily activities?: Yes Is the patient deaf or have difficulty hearing?: No Does the patient have difficulty seeing, even when wearing glasses/contacts?: No Does the patient have difficulty concentrating, remembering, or making decisions?: No Patient able to  express need for assistance with ADLs?: Yes Does the patient have difficulty dressing or bathing?: Yes Independently performs ADLs?: Yes (appropriate for developmental age) Does the patient have difficulty walking or climbing stairs?: Yes Weakness of Legs: Both Weakness of Arms/Hands: None  Permission Sought/Granted Permission sought to share information with : Case Manager, Investment banker, corporate granted to share info w AGENCY: Potential SNF or Home Health        Emotional Assessment         Alcohol / Substance Use: Not Applicable Psych Involvement: No (comment)  Admission diagnosis:  SOB (shortness of breath) [R06.02] Moderate persistent asthma with exacerbation [J45.41] Acute respiratory failure with hypoxia ([place]) [J96.01] Upper respiratory tract infection, unspecified type [J06.9] Patient Active Problem List   Diagnosis Date Noted   Acute respiratory failure with hypoxia ([place]) 01/07/2021   COPD (chronic obstructive pulmonary disease) ([place]) 01/06/2021   Anemia 01/06/2021   Hyperlipidemia 01/06/2021   Hypertension 01/06/2021   Multinodular goiter 01/06/2021   Type 2 diabetes mellitus with stage 3b chronic kidney disease, without long-term current use of insulin ([place]) 01/06/2021   Stage 3b chronic kidney disease ([place]) 12/25/2020   Respiratory failure with hypoxia ([place]) 07/14/2015   Pressure ulcer 07/14/2015   PCP:  Harrel Lemon, MD Pharmacy:   [id number] - [place], [place] - [address] [place] [address]  Phone: [phone] Fax: [phone]     Social Determinants of Health (SDOH) Interventions    Readmission Risk Interventions No flowsheet data found.

## 2021-01-09 ENCOUNTER — Inpatient Hospital Stay: Payer: Medicare Other

## 2021-01-09 DIAGNOSIS — I10 Essential (primary) hypertension: Secondary | ICD-10-CM | POA: Diagnosis not present

## 2021-01-09 DIAGNOSIS — E042 Nontoxic multinodular goiter: Secondary | ICD-10-CM | POA: Diagnosis not present

## 2021-01-09 DIAGNOSIS — D649 Anemia, unspecified: Secondary | ICD-10-CM | POA: Diagnosis not present

## 2021-01-09 DIAGNOSIS — J9601 Acute respiratory failure with hypoxia: Secondary | ICD-10-CM | POA: Diagnosis not present

## 2021-01-09 LAB — GLUCOSE, CAPILLARY
Glucose-Capillary: 230 mg/dL — ABNORMAL HIGH (ref 70–99)
Glucose-Capillary: 271 mg/dL — ABNORMAL HIGH (ref 70–99)
Glucose-Capillary: 258 mg/dL — ABNORMAL HIGH (ref 70–99)
Glucose-Capillary: 167 mg/dL — ABNORMAL HIGH (ref 70–99)

## 2021-01-09 LAB — BASIC METABOLIC PANEL
Anion gap: 9 (ref 5–15)
BUN: 52 mg/dL — ABNORMAL HIGH (ref 8–23)
CO2: 26 mmol/L (ref 22–32)
Calcium: 8.5 mg/dL — ABNORMAL LOW (ref 8.9–10.3)
Chloride: 105 mmol/L (ref 98–111)
Creatinine, Ser: 1.99 mg/dL — ABNORMAL HIGH (ref 0.44–1.00)
GFR, Estimated: 25 mL/min — ABNORMAL LOW (ref >60)
Glucose, Bld: 287 mg/dL — ABNORMAL HIGH (ref 70–99)
Potassium: 4.2 mmol/L (ref 3.5–5.1)
Sodium: 140 mmol/L (ref 135–145)

## 2021-01-09 MED ORDER — IPRATROPIUM-ALBUTEROL 0.5-2.5 (3) MG/3ML IN SOLN
3.0000 mL | Freq: Four times a day (QID) | RESPIRATORY_TRACT | Status: DC
Start: 2021-01-09 — End: 2021-01-10
  Administered 2021-01-09 – 2021-01-10 (×3): 3 mL via RESPIRATORY_TRACT
  Filled 2021-01-09 (×3): qty 3

## 2021-01-09 MED ORDER — GUAIFENESIN ER 600 MG PO TB12: 1200.0000 mg | ORAL_TABLET | Freq: Two times a day (BID) | ORAL | Status: DC | PRN

## 2021-01-09 NOTE — Progress Notes (Signed)
PROGRESS NOTE  Brandi Hoover MVH:[id number] DOB: [birth year] DOA: 01/06/2021 PCP: Harrel Lemon, MD  Brief History   Brandi Hoover is a 80 y.o. female with hx of asthma/COPD, diabetes mellitus type 2, hypertension, obesity who presented with complaints of shortness of breath and wheezing for the past few days.  Patient stated that her daughter has been ill with some respiratory illness.  The patient states that she is feeling a little better today. No new complaints.  Consultants  None  Procedures  None  Antibiotics   Anti-infectives (From admission, onward)    None      Subjective  The patient is resting comfortably. O2 requirements have decreased to 8 L O2 by HFNC. Daughter is at bedside. No new complaints.  Objective   Vitals:  Vitals:   01/09/21 0809 01/09/21 1126  BP: (!) 154/80   Pulse: (!) 110   Resp: 20   Temp: 98.6 F (37 C)   SpO2: 100% 94%    Exam:  Constitutional:  The patient is awake, alert, and oriented x 3. No acute distress. Respiratory:  No increased work of breathing. No wheezes, rales, or rhonchi No tactile fremitus Cardiovascular:  Regular rate and rhythm No murmurs, ectopy, or gallups. No lateral PMI. No thrills. Abdomen:  Abdomen is soft, non-tender, non-distended No hernias, masses, or organomegaly Normoactive bowel sounds.  Musculoskeletal:  No cyanosis, clubbing, or edema Skin:  No rashes, lesions, ulcers palpation of skin: no induration or nodules Neurologic:  CN 2-12 intact Sensation all 4 extremities intact Psychiatric:  Mental status Mood, affect appropriate Orientation to person, place, time  judgment and insight appear intact   I have personally reviewed the following:   Today's Data  Vitals  Lab Data  CBC, BMP, Glucoses  Micro Data  Viral respiratory panel positive for metapneumovirus  Imaging  CXR  Cardiology Data  EKG Echocardiogram  Scheduled Meds:  amLODipine  10 mg Oral Daily   ascorbic  acid  250 mg Oral Daily   benazepril  20 mg Oral BID   enoxaparin (LOVENOX) injection  40 mg Subcutaneous Q24H   feeding supplement (GLUCERNA SHAKE)  237 mL Oral BID BM   fluticasone-salmeterol  1 puff Inhalation BID   hydrochlorothiazide  12.5 mg Oral BID   insulin aspart  0-15 Units Subcutaneous TID WC   insulin aspart  0-5 Units Subcutaneous QHS   ipratropium  0.5 mg Nebulization Q4H   levalbuterol  0.63 mg Nebulization Q4H   mouth rinse  15 mL Mouth Rinse BID   methylPREDNISolone (SOLU-MEDROL) injection  40 mg Intravenous Q12H   multivitamin with minerals  1 tablet Oral Daily   Ensure Max Protein  11 oz Oral QHS   simvastatin  20 mg Oral QHS    Principal Problem:   Respiratory failure with hypoxia (HCC) Active Problems:   COPD (chronic obstructive pulmonary disease) (HCC)   Anemia   Hypertension   Multinodular goiter   Stage 3b chronic kidney disease (HCC)   Type 2 diabetes mellitus with stage 3b chronic kidney disease, without long-term current use of insulin (HCC)   Acute respiratory failure with hypoxia (HCC)   LOS: 2 days    A & P  # Acute hypoxemic respiratory failure 2/2 Metapneumovirus PNA --needing up to 7L high-flow [place] to maintain O2 sats in low 90's. --CXR relatively clear, no fever, no leukocytosis, no strong evidence of bacterial PNA. PLAN:  --Continue supplemental O2 to keep sats between 88-92%, wean as tolerated   #  Asthma/COPD exacerbation --Pt had both dx in medical records.  --presented with significant dyspnea, rhonchi and wheezes, increased sputum production, and hypoxemia.  Exacerbation triggered by viral PNA. --started on IV solumedrol 40 mg q12h Plan: --cont IV solumedrol 40 mg q12h for now  --schedule ipratropium and Xopenex nebs q4h --cont home Advair as Dulera   HTN --cont amlodipine, benazepril and HCTZ   CKD 3A --baseline around 1.5-1.6.     DM2 --pt takes metformin, glipizide and JANUVIA at home. --hold home agents --ACHS and  SSI for now  I have seen and examined this patient myself. I have spent 32 minutes in her evaluation and care.     DVT prophylaxis: Lovenox SQ Code Status: Full code pt confirmed she would want intubation. Family Communication: daughter at bedside   Level of care: Telemetry Medical Dispo:   The patient is from: home Anticipated d/c is to: home Anticipated d/c date is: > 3 days Patient currently is not medically ready to d/c due to: 8L new O2 requirement  Brandi Ruben, DO [facility] Direct contact: see [email or web address]  7PM-7AM contact night coverage as above 01/09/2021, 3:33 PM  LOS: 1 day       d

## 2021-01-09 NOTE — Progress Notes (Signed)
Inpatient Diabetes Program Recommendations  AACE/ADA: New Consensus Statement on Inpatient Glycemic Control   Target Ranges:  Prepandial:   less than 140 mg/dL      Peak postprandial:   less than 180 mg/dL (1-2 hours)      Critically ill patients:  140 - 180 mg/dL    Latest Reference Range & Units 01/09/21 08:07  Glucose-Capillary 70 - 99 mg/dL 230 (H)    Latest Reference Range & Units 01/08/21 08:03 01/08/21 11:48 01/08/21 15:52 01/08/21 22:26  Glucose-Capillary 70 - 99 mg/dL 199 (H) 231 (H) 265 (H) 186 (H)   Review of Glycemic Control  Diabetes history: DM2 Outpatient Diabetes medications: Glipizide 10 mg BID, Metformin 1500 mg QAM, Metformin 1000 mg QPM, Januvia 25 mg daily (not taking per med list) Current orders for Inpatient glycemic control: Novolog 0-15 units TID with meals, Novolog 0-5 units QHS; Solumedrol 40 mg Q12H  Inpatient Diabetes Program Recommendations:    Insulin: If steroids are continued, please consider ordering Semglee 5 units Q24H.  Thanks, Barnie Alderman, RN, MSN, CDE Diabetes Coordinator Inpatient Diabetes Program [phone] (Team Pager from 8am to 5pm)

## 2021-01-09 NOTE — Progress Notes (Signed)
Physical Therapy Treatment Patient Details Name: Brandi Hoover MRN: [id number] DOB: 1940-03-17 Today's Date: 01/09/2021   History of Present Illness Brandi Hoover is an 43yoF who comes [facility] on [facility]/06[facility]21 c weakness, SOB, wheezing. PMH: asthma, DM2, HTN, obesity. Pt admitted with ARF c hypoxia. At baseline pt lives alone with 24/5 supervision/asssist from DTR, household AMB c 4WW, no falls in past 6 months.    PT Comments    Pt in bed asleep at entry, DTR at bedside. Both easily made awake, agreeable to session. HFNC on 8L throughout, HR in RVR ranging 100s-130s bpm throughout irrespective of position/activity. Pt appears quite ill and weak, but is agreeable to give her best effort. MinA to facilitate getting to EOB, after sitting in short sitting, pelvis 30 degrees from EOB, assistance given due to obvious exhaustion. Pt unable to rise from EOB to standing without maxA, but when EOB is elevated closed to the level of her couch height at home she is able to come to standing with max effort and very minimal Assist of trunk to facilitate forward weight shift while forward flexed. Pt standing in NAD briefly, VSS, but when attempting stepping to left, losses balance immediately and collapses into short sitting. Pt asks to remain at EOB while she cools down, given a wet towel and her fan. RT coming to room for scheduled breathing treatment. NA made aware of new purewick needs. Pt significantly deconditioned from baseline, unable to safely transition to home at present, even with assist from DTR. Would better served with STR stay prior to eventual return to home.     Recommendations for follow up therapy are one component of a multi-disciplinary discharge planning process, led by the attending physician.  Recommendations may be updated based on patient status, additional functional criteria and insurance authorization.  Follow Up Recommendations  Skilled nursing-short term rehab (<3 hours/day)      Assistance Recommended at Discharge Frequent or constant Supervision/Assistance  Equipment Recommendations  Other (comment)    Recommendations for Other Services       Precautions / Restrictions Precautions Precautions: Fall Precaution Comments: monitor O2 sats, HR Restrictions Weight Bearing Restrictions: No     Mobility  Bed Mobility Overal bed mobility: Needs Assistance Bed Mobility: Supine to Sit     Supine to sit: Min assist     General bed mobility comments: segmented due to continualhot flashing, SOB.    Transfers Overall transfer level: Needs assistance Equipment used: Rolling walker (2 wheels) Transfers: Sit to/from Stand Sit to Stand: Mod assist           General transfer comment: very minimal minA from elevated surface with RW;  able to stand for 15 sec without LOB, vitals unchanged    Ambulation/Gait Ambulation/Gait assistance:  (attempts Left sidestepping to get to EOB, near-immediate LOB with retropulsion into bed.) Gait Distance (Feet): 0 Feet               Stairs             Wheelchair Mobility    Modified Rankin (Stroke Patients Only)       Balance                                            Cognition Arousal/Alertness: Awake/alert Behavior During Therapy: Anxious Overall Cognitive Status: Within Functional Limits for tasks assessed  Exercises      General Comments        Pertinent Vitals/Pain Pain Assessment: No/denies pain    Home Living                          Prior Function            PT Goals (current goals can now be found in the care plan section) Acute Rehab PT Goals Patient Stated Goal: regain baseline mobility without dyspnea/hypoxia PT Goal Formulation: With patient Time For Goal Achievement: 01/22/21 Potential to Achieve Goals: Good Progress towards PT goals: Not progressing toward goals - comment     Frequency    Min 2X/week      PT Plan Discharge plan needs to be updated    Co-evaluation              AM-PAC PT "6 Clicks" Mobility   Outcome Measure  Help needed turning from your back to your side while in a flat bed without using bedrails?: A Lot Help needed moving from lying on your back to sitting on the side of a flat bed without using bedrails?: A Lot Help needed moving to and from a bed to a chair (including a wheelchair)?: A Lot Help needed standing up from a chair using your arms (e.g., wheelchair or bedside chair)?: A Lot Help needed to walk in hospital room?: Total Help needed climbing 3-5 steps with a railing? : Total 6 Click Score: 10    End of Session Equipment Utilized During Treatment: Oxygen Activity Tolerance: Treatment limited secondary to medical complications (Comment);Patient limited by fatigue (HR 100s-130s. continually dyspnic) Patient left: in bed;with family/visitor present;with call bell/phone within reach (seated EOB with air turned down, wet towel on head, fan in face. awaiting new purewick.) Nurse Communication: Mobility status PT Visit Diagnosis: Difficulty in walking, not elsewhere classified (R26.2);Other abnormalities of gait and mobility (R26.89);Muscle weakness (generalized) (M62.81)     Time: 7124-5809 PT Time Calculation (min) (ACUTE ONLY): 23 min  Charges:  $Therapeutic Activity: 23-37 mins                    3:32 PM, 01/09/21 Etta Grandchild, PT, DPT Physical Therapist - [facility]  [phone] ([place])     Juergen Hardenbrook C 01/09/2021, 3:25 PM

## 2021-01-10 DIAGNOSIS — I10 Essential (primary) hypertension: Secondary | ICD-10-CM | POA: Diagnosis not present

## 2021-01-10 DIAGNOSIS — E042 Nontoxic multinodular goiter: Secondary | ICD-10-CM | POA: Diagnosis not present

## 2021-01-10 DIAGNOSIS — J9601 Acute respiratory failure with hypoxia: Secondary | ICD-10-CM | POA: Diagnosis not present

## 2021-01-10 DIAGNOSIS — D649 Anemia, unspecified: Secondary | ICD-10-CM | POA: Diagnosis not present

## 2021-01-10 LAB — BASIC METABOLIC PANEL
Anion gap: 10 (ref 5–15)
BUN: 54 mg/dL — ABNORMAL HIGH (ref 8–23)
CO2: 26 mmol/L (ref 22–32)
Calcium: 8.3 mg/dL — ABNORMAL LOW (ref 8.9–10.3)
Chloride: 101 mmol/L (ref 98–111)
Creatinine, Ser: 1.75 mg/dL — ABNORMAL HIGH (ref 0.44–1.00)
GFR, Estimated: 29 mL/min — ABNORMAL LOW (ref >60)
Glucose, Bld: 319 mg/dL — ABNORMAL HIGH (ref 70–99)
Potassium: 4.1 mmol/L (ref 3.5–5.1)
Sodium: 137 mmol/L (ref 135–145)

## 2021-01-10 LAB — CBC WITH DIFFERENTIAL/PLATELET
Abs Immature Granulocytes: 0.11 10*3/uL — ABNORMAL HIGH (ref 0.00–0.07)
Basophils Absolute: 0 10*3/uL (ref 0.0–0.1)
Basophils Relative: 0 %
Eosinophils Absolute: 0 10*3/uL (ref 0.0–0.5)
Eosinophils Relative: 0 %
HCT: 33.3 % — ABNORMAL LOW (ref 36.0–46.0)
Hemoglobin: 10.4 g/dL — ABNORMAL LOW (ref 12.0–15.0)
Immature Granulocytes: 1 %
Lymphocytes Relative: 9 %
Lymphs Abs: 1 10*3/uL (ref 0.7–4.0)
MCH: 29.1 pg (ref 26.0–34.0)
MCHC: 31.2 g/dL (ref 30.0–36.0)
MCV: 93 fL (ref 80.0–100.0)
Monocytes Absolute: 0.5 10*3/uL (ref 0.1–1.0)
Monocytes Relative: 4 %
Neutro Abs: 9.7 10*3/uL — ABNORMAL HIGH (ref 1.7–7.7)
Neutrophils Relative %: 86 %
Platelets: 274 10*3/uL (ref 150–400)
RBC: 3.58 MIL/uL — ABNORMAL LOW (ref 3.87–5.11)
RDW: 13.1 % (ref 11.5–15.5)
WBC: 11.3 10*3/uL — ABNORMAL HIGH (ref 4.0–10.5)
nRBC: 0 % (ref 0.0–0.2)

## 2021-01-10 LAB — GLUCOSE, CAPILLARY
Glucose-Capillary: 270 mg/dL — ABNORMAL HIGH (ref 70–99)
Glucose-Capillary: 288 mg/dL — ABNORMAL HIGH (ref 70–99)
Glucose-Capillary: 313 mg/dL — ABNORMAL HIGH (ref 70–99)
Glucose-Capillary: 219 mg/dL — ABNORMAL HIGH (ref 70–99)

## 2021-01-10 MED ORDER — INSULIN GLARGINE-YFGN 100 UNIT/ML ~~LOC~~ SOLN
15.0000 [IU] | Freq: Every day | SUBCUTANEOUS | Status: DC
  Administered 2021-01-10: 15 [IU] via SUBCUTANEOUS
  Filled 2021-01-10: qty 0.15

## 2021-01-10 MED ORDER — INSULIN GLARGINE-YFGN 100 UNIT/ML ~~LOC~~ SOLN
25.0000 [IU] | Freq: Every day | SUBCUTANEOUS | Status: DC
  Administered 2021-01-11: 09:00:00 25 [IU] via SUBCUTANEOUS
  Filled 2021-01-10: qty 0.25

## 2021-01-10 MED ORDER — LEVALBUTEROL HCL 1.25 MG/0.5ML IN NEBU
1.2500 mg | INHALATION_SOLUTION | Freq: Four times a day (QID) | RESPIRATORY_TRACT | Status: DC
Start: 2021-01-10 — End: 2021-01-13
  Administered 2021-01-10 – 2021-01-12 (×9): 1.25 mg via RESPIRATORY_TRACT
  Filled 2021-01-10 (×10): qty 0.5

## 2021-01-10 NOTE — Progress Notes (Signed)
PROGRESS NOTE  Brandi Hoover:[id number] DOB: 04-24-1940 DOA: 01/06/2021 PCP: Harrel Lemon, MD  Brief History   Brandi Hoover is a 80 y.o. female with hx of asthma/COPD, diabetes mellitus type 2, hypertension, obesity who presented with complaints of shortness of breath and wheezing for the past few days.  Patient stated that her daughter has been ill with some respiratory illness.  The patient states that she is feeling a little better today. No new complaints.  Consultants  None  Procedures  None  Antibiotics   Anti-infectives (From admission, onward)    None      Subjective  The patient is resting comfortably. O2 requirements have decreased to 7 L O2 by HFNC. Daughter is at bedside. No new complaints.  Objective   Vitals:  Vitals:   01/10/21 1223 01/10/21 1313  BP: (!) 148/61   Pulse: 94   Resp: 18   Temp: 98.4 F (36.9 C)   SpO2: 93% 95%    Exam:  Constitutional:  The patient is awake, alert, and oriented x 3. No acute distress. Respiratory:  No increased work of breathing. No wheezes, rales, or rhonchi No tactile fremitus Cardiovascular:  Regular rate and rhythm No murmurs, ectopy, or gallups. No lateral PMI. No thrills. Abdomen:  Abdomen is soft, non-tender, non-distended No hernias, masses, or organomegaly Normoactive bowel sounds.  Musculoskeletal:  No cyanosis, clubbing, or edema Skin:  No rashes, lesions, ulcers palpation of skin: no induration or nodules Neurologic:  CN 2-12 intact Sensation all 4 extremities intact Psychiatric:  Mental status Mood, affect appropriate Orientation to person, place, time  judgment and insight appear intact  I have personally reviewed the following:   Today's Data  Vitals  Lab Data  CBC, BMP, Glucoses  Micro Data  Viral respiratory panel positive for metapneumovirus  Imaging  CXR  Cardiology Data  EKG Echocardiogram  Scheduled Meds:  amLODipine  10 mg Oral Daily   ascorbic acid   250 mg Oral Daily   benazepril  20 mg Oral BID   enoxaparin (LOVENOX) injection  40 mg Subcutaneous Q24H   feeding supplement (GLUCERNA SHAKE)  237 mL Oral BID BM   fluticasone-salmeterol  1 puff Inhalation BID   hydrochlorothiazide  12.5 mg Oral BID   insulin aspart  0-15 Units Subcutaneous TID WC   insulin aspart  0-5 Units Subcutaneous QHS   insulin glargine-yfgn  15 Units Subcutaneous Daily   levalbuterol  1.25 mg Nebulization Q6H   mouth rinse  15 mL Mouth Rinse BID   methylPREDNISolone (SOLU-MEDROL) injection  40 mg Intravenous Q12H   multivitamin with minerals  1 tablet Oral Daily   Ensure Max Protein  11 oz Oral QHS   simvastatin  20 mg Oral QHS    Principal Problem:   Respiratory failure with hypoxia (HCC) Active Problems:   COPD (chronic obstructive pulmonary disease) (HCC)   Anemia   Hypertension   Multinodular goiter   Stage 3b chronic kidney disease (HCC)   Type 2 diabetes mellitus with stage 3b chronic kidney disease, without long-term current use of insulin (HCC)   Acute respiratory failure with hypoxia (HCC)   LOS: 3 days    A & P  # Acute hypoxemic respiratory failure 2/2 Metapneumovirus PNA --The patient has required up to 10 L O2 by high-flow [place] to maintain O2 sats in low 90's. Today this has decreased to 7L. Will continue to wean as possible.  --CXR relatively clear, no fever, no leukocytosis, no strong evidence  of bacterial PNA. PLAN:  --Continue supplemental O2 to keep sats between 88-92%, wean as tolerated   # Asthma/COPD exacerbation --Pt had both dx in medical records.  --presented with significant dyspnea, rhonchi and wheezes, increased sputum production, and hypoxemia.  Exacerbation triggered by viral PNA. --started on IV solumedrol 40 mg q12h Plan: --cont IV solumedrol 40 mg q12h for now  --schedule ipratropium and Xopenex nebs q4h --cont home Advair as Dulera   HTN --cont amlodipine, benazepril and HCTZ   CKD 3A --baseline around  1.5-1.6.  Creatinine today is down to 1.75 down from a zenith of 1.99. Monitor creatinine, electrolytes and volume status. Avoid nephrotoxins and hypotension.   DM2 --pt takes metformin, glipizide and JANUVIA at home. --hold home agents --ACHS and SSI for now - Increase lantus to 25 units daily. Will wean back as steroids are weaned down. Glucoses 257-319 today.   I have seen and examined this patient myself. I have spent 34 minutes in her evaluation and care.     DVT prophylaxis: Lovenox SQ Code Status: Full code pt confirmed she would want intubation. Family Communication: daughter at bedside   Level of care: Telemetry Medical Dispo:   The patient is from: home Anticipated d/c is to: home Anticipated d/c date is: > 3 days Patient currently is not medically ready to d/c due to: 7L new O2 requirement  Tris Howell, DO [facility] Direct contact: see [email or web address]  7PM-7AM contact night coverage as above 01/09/2021, 2:43 PM  LOS: 1 day

## 2021-01-10 NOTE — Plan of Care (Signed)
  Problem: Clinical Measurements: Goal: Diagnostic test results will improve Outcome: Progressing   Problem: Clinical Measurements: Goal: Respiratory complications will improve Outcome: Progressing   Problem: Clinical Measurements: Goal: Cardiovascular complication will be avoided Outcome: Progressing   

## 2021-01-11 DIAGNOSIS — I10 Essential (primary) hypertension: Secondary | ICD-10-CM | POA: Diagnosis not present

## 2021-01-11 DIAGNOSIS — J441 Chronic obstructive pulmonary disease with (acute) exacerbation: Secondary | ICD-10-CM | POA: Diagnosis not present

## 2021-01-11 DIAGNOSIS — D649 Anemia, unspecified: Secondary | ICD-10-CM | POA: Diagnosis not present

## 2021-01-11 DIAGNOSIS — J9601 Acute respiratory failure with hypoxia: Secondary | ICD-10-CM | POA: Diagnosis not present

## 2021-01-11 LAB — GLUCOSE, CAPILLARY
Glucose-Capillary: 309 mg/dL — ABNORMAL HIGH (ref 70–99)
Glucose-Capillary: 260 mg/dL — ABNORMAL HIGH (ref 70–99)
Glucose-Capillary: 182 mg/dL — ABNORMAL HIGH (ref 70–99)
Glucose-Capillary: 117 mg/dL — ABNORMAL HIGH (ref 70–99)

## 2021-01-11 MED ORDER — INSULIN GLARGINE-YFGN 100 UNIT/ML ~~LOC~~ SOLN
30.0000 [IU] | Freq: Every day | SUBCUTANEOUS | Status: DC
  Administered 2021-01-12: 30 [IU] via SUBCUTANEOUS
  Filled 2021-01-11: qty 0.3

## 2021-01-11 MED ORDER — IPRATROPIUM BROMIDE 0.02 % IN SOLN
0.5000 mg | Freq: Four times a day (QID) | RESPIRATORY_TRACT | Status: DC
Start: 2021-01-11 — End: 2021-01-13
  Administered 2021-01-11 – 2021-01-12 (×5): 0.5 mg via RESPIRATORY_TRACT
  Filled 2021-01-11 (×6): qty 2.5

## 2021-01-11 MED ORDER — PREDNISONE 50 MG PO TABS
60.0000 mg | ORAL_TABLET | Freq: Every day | ORAL | Status: DC
  Administered 2021-01-12 – 2021-01-15 (×4): 60 mg via ORAL
  Filled 2021-01-11 (×4): qty 1

## 2021-01-11 NOTE — Plan of Care (Signed)

## 2021-01-11 NOTE — NC FL2 (Signed)
Welcome LEVEL OF CARE SCREENING TOOL     IDENTIFICATION  Patient Name: Brandi Hoover Birthdate: 04/20/1940 Sex: female Admission Date (Current Location): 01/06/2021  [facility] and [place] Number:  Engineering geologist and Address:  [facility], [address], [place], [place] [address]      Provider Number: [id number]  Attending Physician Name and Address:  Karie Kirks, DO  Relative Name and Phone Number:  Herbert Seta (Daughter)   [phone] [facility])    Current Level of Care: Hospital Recommended Level of Care: [place] Prior Approval Number:    Date Approved/Denied:   PASRR Number: pending  Discharge Plan:      Current Diagnoses: Patient Active Problem List   Diagnosis Date Noted   Acute respiratory failure with hypoxia ([place]) 01/07/2021   COPD (chronic obstructive pulmonary disease) ([place]) 01/06/2021   Anemia 01/06/2021   Hyperlipidemia 01/06/2021   Hypertension 01/06/2021   Multinodular goiter 01/06/2021   Type 2 diabetes mellitus with stage 3b chronic kidney disease, without long-term current use of insulin ([place]) 01/06/2021   Stage 3b chronic kidney disease ([place]) 12/25/2020   Respiratory failure with hypoxia ([place]) 07/14/2015   Pressure ulcer 07/14/2015    Orientation RESPIRATION BLADDER Height & Weight     Self, Situation, Place, Time  O2 Incontinent, Indwelling catheter Weight: 264 lb 5.3 oz (119.9 kg) Height:  5' 6.5" (168.9 cm)  BEHAVIORAL SYMPTOMS/MOOD NEUROLOGICAL BOWEL NUTRITION STATUS      Incontinent Diet (carb modified)  AMBULATORY STATUS COMMUNICATION OF NEEDS Skin   Limited Assist Verbally Bruising, Other (Comment) (rash)                       Personal Care Assistance Level of Assistance  Bathing, Feeding, Dressing Bathing Assistance: Limited assistance Feeding assistance: Independent Dressing Assistance: Limited assistance     Functional Limitations Info              SPECIAL CARE FACTORS FREQUENCY  PT (By licensed PT), OT (By licensed OT)     PT Frequency: 5 times per week OT Frequency: 5 times per week            Contractures      Additional Factors Info  Code Status, Allergies Code Status Info: full Allergies Info: Allergies: Mometasone Furo-formoterol Fum, Azithromycin, Gabapentin, Omeprazole, Other, Ramipril           Current Medications (01/11/2021):  This is the current hospital active medication list Current Facility-Administered Medications  Medication Dose Route Frequency Provider Last Rate Last Admin   acetaminophen (TYLENOL) tablet 650 mg  650 mg Oral Q6H PRN Para Skeans, MD       Or   acetaminophen (TYLENOL) suppository 650 mg  650 mg Rectal Q6H PRN Para Skeans, MD       amLODipine (NORVASC) tablet 10 mg  10 mg Oral Daily Florina Ou V, MD   10 mg at 01/11/21 9622   ascorbic acid (VITAMIN C) tablet 250 mg  250 mg Oral Daily Florina Ou V, MD   250 mg at 01/11/21 0915   benazepril (LOTENSIN) tablet 20 mg  20 mg Oral BID Para Skeans, MD   20 mg at 01/11/21 0915   benzonatate (TESSALON) capsule 100 mg  100 mg Oral TID PRN Sharion Settler, NP   100 mg at 01/08/21 0532   chlorpheniramine-HYDROcodone (TUSSIONEX) 10-8 MG/5ML suspension 5 mL  5 mL Oral Q12H PRN Swayze, [name], DO  enoxaparin (LOVENOX) injection 40 mg  40 mg Subcutaneous Q24H Enzo Bi, MD   40 mg at 01/10/21 2116   feeding supplement (GLUCERNA SHAKE) (GLUCERNA SHAKE) liquid 237 mL  237 mL Oral BID BM Enzo Bi, MD   237 mL at 01/11/21 1317   fluticasone-salmeterol (ADVAIR) 250-50 MCG/ACT inhaler 1 puff  1 puff Inhalation BID Swayze, [name], DO   1 puff at 01/11/21 0802   guaiFENesin (MUCINEX) 12 hr tablet 1,200 mg  1,200 mg Oral BID PRN Swayze, [name], DO       guaiFENesin-dextromethorphan (ROBITUSSIN DM) 100-10 MG/5ML syrup 5 mL  5 mL Oral Q4H PRN Swayze, [name], DO       hydrALAZINE (APRESOLINE) injection 5 mg  5 mg Intravenous Q4H PRN Para Skeans, MD   5 mg at  01/10/21 1758   hydrochlorothiazide (HYDRODIURIL) tablet 12.5 mg  12.5 mg Oral BID Para Skeans, MD   12.5 mg at 01/11/21 0915   insulin aspart (novoLOG) injection 0-15 Units  0-15 Units Subcutaneous TID WC Enzo Bi, MD   8 Units at 01/11/21 1250   insulin aspart (novoLOG) injection 0-5 Units  0-5 Units Subcutaneous QHS Enzo Bi, MD   2 Units at 01/10/21 2232   [START ON 01/12/2021] insulin glargine-yfgn (SEMGLEE) injection 30 Units  30 Units Subcutaneous Daily Swayze, [name], DO       ipratropium (ATROVENT) nebulizer solution 0.5 mg  0.5 mg Nebulization Q6H Swayze, [name], DO   0.5 mg at 01/11/21 1459   levalbuterol (XOPENEX) nebulizer solution 1.25 mg  1.25 mg Nebulization Q6H Swayze, [name], DO   1.25 mg at 01/11/21 1300   MEDLINE mouth rinse  15 mL Mouth Rinse BID Enzo Bi, MD   15 mL at 01/11/21 0916   multivitamin with minerals tablet 1 tablet  1 tablet Oral Daily Enzo Bi, MD   1 tablet at 01/11/21 0916   ondansetron (ZOFRAN) tablet 4 mg  4 mg Oral Q6H PRN Para Skeans, MD       Or   ondansetron (ZOFRAN) injection 4 mg  4 mg Intravenous Q6H PRN Para Skeans, MD       [START ON 01/12/2021] predniSONE (DELTASONE) tablet 60 mg  60 mg Oral Q breakfast Swayze, [name], DO       protein supplement (ENSURE MAX) liquid  11 oz Oral QHS Enzo Bi, MD   11 oz at 01/10/21 2116   simvastatin (ZOCOR) tablet 20 mg  20 mg Oral QHS Florina Ou V, MD   20 mg at 01/10/21 2115   traMADol (ULTRAM) tablet 50 mg  50 mg Oral Q6H PRN Para Skeans, MD         Discharge Medications: Please see discharge summary for a list of discharge medications.  Relevant Imaging Results:  Relevant Lab Results:   Additional Information SS #: [place], LCSW

## 2021-01-11 NOTE — Progress Notes (Addendum)
PROGRESS NOTE  Brandi Hoover TKZ:[id number] DOB: 11/12/1940 DOA: 01/06/2021 PCP: Harrel Lemon, MD  Brief History   Brandi Hoover is a 80 y.o. female with hx of asthma/COPD, diabetes mellitus type 2, hypertension, obesity who presented with complaints of shortness of breath and wheezing for the past few days.  Patient stated that her daughter has been ill with some respiratory illness.  The patient states that she is feeling a little better today. She continues to require 7L by [place] maintain saturations in the 90's.  No new complaints.  Consultants  None  Procedures  None  Antibiotics   Anti-infectives (From admission, onward)    None      Subjective  The patient is resting comfortably. O2 requirements remain at to 7 L O2 by [facility]NC. Daughter is at bedside. No new complaints.  Objective   Vitals:  Vitals:   01/11/21 1000 01/11/21 1100  BP:  (!) 147/73  Pulse: 99 98  Resp: 18 18  Temp:  97.9 F (36.6 C)  SpO2: 95% (!) 9%    Exam:  Constitutional:  The patient is awake, alert, and oriented x 3. No acute distress. Respiratory:  No increased work of breathing. Poor air entry No wheezes, rales, or rhonchi No tactile fremitus Cardiovascular:  Regular rate and rhythm No murmurs, ectopy, or gallups. No lateral PMI. No thrills. Abdomen:  Abdomen is soft, non-tender, non-distended No hernias, masses, or organomegaly Normoactive bowel sounds.  Musculoskeletal:  No cyanosis, clubbing, or edema Skin:  No rashes, lesions, ulcers palpation of skin: no induration or nodules Neurologic:  CN 2-12 intact Sensation all 4 extremities intact Psychiatric:  Mental status Mood, affect appropriate Orientation to person, place, time  judgment and insight appear intact  I have personally reviewed the following:   Today's Data  Vitals  Lab Data  Glucoses  Micro Data  Viral respiratory panel positive for metapneumovirus  Imaging  CXR  Cardiology Data   EKG Echocardiogram  Scheduled Meds:  amLODipine  10 mg Oral Daily   ascorbic acid  250 mg Oral Daily   benazepril  20 mg Oral BID   enoxaparin (LOVENOX) injection  40 mg Subcutaneous Q24H   feeding supplement (GLUCERNA SHAKE)  237 mL Oral BID BM   fluticasone-salmeterol  1 puff Inhalation BID   hydrochlorothiazide  12.5 mg Oral BID   insulin aspart  0-15 Units Subcutaneous TID WC   insulin aspart  0-5 Units Subcutaneous QHS   insulin glargine-yfgn  25 Units Subcutaneous Daily   ipratropium  0.5 mg Nebulization Q6H   levalbuterol  1.25 mg Nebulization Q6H   mouth rinse  15 mL Mouth Rinse BID   methylPREDNISolone (SOLU-MEDROL) injection  40 mg Intravenous Q12H   multivitamin with minerals  1 tablet Oral Daily   Ensure Max Protein  11 oz Oral QHS   simvastatin  20 mg Oral QHS    Principal Problem:   Respiratory failure with hypoxia (HCC) Active Problems:   COPD (chronic obstructive pulmonary disease) (HCC)   Anemia   Hypertension   Multinodular goiter   Stage 3b chronic kidney disease (HCC)   Type 2 diabetes mellitus with stage 3b chronic kidney disease, without long-term current use of insulin (HCC)   Acute respiratory failure with hypoxia (HCC)   LOS: 4 days    A & P  # Acute hypoxemic respiratory failure 2/2 Metapneumovirus PNA --The patient has required up to 10 L O2 by high-flow [place] to maintain O2 sats in low 90's.  Today she continues to require 7L. Will continue to wean as possible.  --CXR relatively clear, no fever, no leukocytosis, no strong evidence of bacterial PNA. PLAN:  --Continue supplemental O2 to keep sats between 88-92%, wean as tolerated   # Asthma/COPD exacerbation --Pt had both dx in medical records.  --presented with significant dyspnea, rhonchi and wheezes, increased sputum production, and hypoxemia.  Exacerbation triggered by viral PNA. --started on IV solumedrol 40 mg q12h Plan: --Wean steroids to prednisone 60 mg daily --schedule ipratropium  and Xopenex nebs q4h --cont home Advair as Dulera   HTN --cont amlodipine, benazepril and HCTZ   CKD 3A --baseline around 1.5-1.6.  Creatinine today is down to 1.75 down from a zenith of 1.99. Monitor creatinine, electrolytes and volume status. Avoid nephrotoxins and hypotension.   DM2 --pt takes metformin, glipizide and JANUVIA at home. --hold home agents --ACHS and SSI for now - Increase lantus to 230 units daily. Will wean back as steroids are weaned down. Glucoses 257-319 today.   Morbid Obesity:  BMI 42.03. Complicates all cares. Recommend sensible weight loss through diet modification and increased activity as guided by the patient's PCP as outpatient.  I have seen and examined this patient myself. I have spent 32 minutes in her evaluation and care.     DVT prophylaxis: Lovenox SQ Code Status: Full code pt confirmed she would want intubation. Family Communication: daughter at bedside   Level of care: Telemetry Medical Dispo:   The patient is from: home Anticipated d/c is to: home Anticipated d/c date is: > 3 days Patient currently is not medically ready to d/c due to: 7L new O2 requirement  Brandi Messineo, DO [facility] Direct contact: see [email or web address]  7PM-7AM contact night coverage as above 01/11/2021, 1:44 PM  LOS: 1 day

## 2021-01-12 DIAGNOSIS — D649 Anemia, unspecified: Secondary | ICD-10-CM | POA: Diagnosis not present

## 2021-01-12 DIAGNOSIS — J441 Chronic obstructive pulmonary disease with (acute) exacerbation: Secondary | ICD-10-CM | POA: Diagnosis not present

## 2021-01-12 DIAGNOSIS — I10 Essential (primary) hypertension: Secondary | ICD-10-CM | POA: Diagnosis not present

## 2021-01-12 DIAGNOSIS — J9601 Acute respiratory failure with hypoxia: Secondary | ICD-10-CM | POA: Diagnosis not present

## 2021-01-12 LAB — GLUCOSE, CAPILLARY
Glucose-Capillary: 163 mg/dL — ABNORMAL HIGH (ref 70–99)
Glucose-Capillary: 371 mg/dL — ABNORMAL HIGH (ref 70–99)
Glucose-Capillary: 365 mg/dL — ABNORMAL HIGH (ref 70–99)
Glucose-Capillary: 225 mg/dL — ABNORMAL HIGH (ref 70–99)

## 2021-01-12 MED ORDER — INSULIN GLARGINE-YFGN 100 UNIT/ML ~~LOC~~ SOLN
35.0000 [IU] | Freq: Every day | SUBCUTANEOUS | Status: DC
  Administered 2021-01-13 – 2021-01-14 (×2): 35 [IU] via SUBCUTANEOUS
  Filled 2021-01-12 (×3): qty 0.35

## 2021-01-12 NOTE — Progress Notes (Signed)
Physical Therapy Treatment Patient Details Name: Brandi Hoover MRN: [id number] DOB: 01-04-1941 Today's Date: 01/12/2021   History of Present Illness Brandi Hoover is an 25yoF who comes to [facility] on [facility]06[facility]21 c weakness, SOB, wheezing. PMH: asthma, DM2, HTN, obesity. Pt admitted with ARF c hypoxia. At baseline pt lives alone with 24/5 supervision/asssist from DTR, household AMB c 4WW, no falls in past 6 months.    PT Comments    Pt at EOB upon arrival, O2 flow rate down to 6L since prior visit. Sats WNL throughout session, despite periodic breathlessness/dyspnea that is really the biggest limitation in session, requiring countless recovery intervals. Pt remains quite weak, needs minA from EOB to rise to standing and continuous minA in standing while pt establishes balance over 15-20sec. Pt is able to take steps toward recliner, but quickly loses balance and falls into a seated position with delayed descent from author. Pt left up in chair at EOS, NA in room assisting with purewick placement. DTR at bedside.     Recommendations for follow up therapy are one component of a multi-disciplinary discharge planning process, led by the attending physician.  Recommendations may be updated based on patient status, additional functional criteria and insurance authorization.  Follow Up Recommendations  Skilled nursing-short term rehab (<3 hours/day)     Assistance Recommended at Discharge Frequent or constant Supervision/Assistance  Equipment Recommendations  Other (comment)    Recommendations for Other Services       Precautions / Restrictions Precautions Precautions: Fall Precaution Comments: monitor O2 sats, HR Restrictions Weight Bearing Restrictions: No     Mobility  Bed Mobility               General bed mobility comments: seated nearly EOB at entry, needs cues but no assist to scoot forward both feet on floor. Still very limited by pacing issues and wokring around breathlessness  without hypoxia.    Transfers Overall transfer level: Needs assistance Equipment used: Rolling walker (2 wheels) Transfers: Sit to/from Stand Sit to Stand: Min assist;From elevated surface Stand pivot transfers: Min guard         General transfer comment: Requires ~30sec to achive balance each time, no LOB during pivot to recliner, but loses control posteriorly once at chair, and unable to remain up.    Ambulation/Gait                   Stairs             Wheelchair Mobility    Modified Rankin (Stroke Patients Only)       Balance                                            Cognition Arousal/Alertness: Awake/alert Behavior During Therapy: Anxious Overall Cognitive Status: Within Functional Limits for tasks assessed                                 General Comments: VC throughout session for PLB        Exercises      General Comments        Pertinent Vitals/Pain Pain Assessment: No/denies pain    Home Living  Prior Function            PT Goals (current goals can now be found in the care plan section) Acute Rehab PT Goals Patient Stated Goal: regain baseline mobility without dyspnea/hypoxia PT Goal Formulation: With patient Time For Goal Achievement: 01/22/21 Potential to Achieve Goals: Good Progress towards PT goals: Progressing toward goals    Frequency    Min 2X/week      PT Plan Current plan remains appropriate    Co-evaluation              AM-PAC PT "6 Clicks" Mobility   Outcome Measure  Help needed turning from your back to your side while in a flat bed without using bedrails?: A Lot Help needed moving from lying on your back to sitting on the side of a flat bed without using bedrails?: A Lot Help needed moving to and from a bed to a chair (including a wheelchair)?: A Lot Help needed standing up from a chair using your arms (e.g., wheelchair or  bedside chair)?: A Lot Help needed to walk in hospital room?: Total Help needed climbing 3-5 steps with a railing? : Total 6 Click Score: 10    End of Session Equipment Utilized During Treatment: Oxygen Activity Tolerance: Treatment limited secondary to medical complications (Comment);Patient limited by fatigue Patient left: in bed;with family/visitor present;with call bell/phone within reach Nurse Communication: Mobility status PT Visit Diagnosis: Difficulty in walking, not elsewhere classified (R26.2);Other abnormalities of gait and mobility (R26.89);Muscle weakness (generalized) (M62.81)     Time: 7711-6579 PT Time Calculation (min) (ACUTE ONLY): 39 min  Charges:  $Therapeutic Exercise: 23-37 mins $Therapeutic Activity: 8-22 mins                    3:06 PM, 01/12/21 Etta Grandchild, PT, DPT Physical Therapist - [facility]  [phone] ([place])     [place] 01/12/2021, 3:03 PM

## 2021-01-12 NOTE — TOC Progression Note (Signed)
Transition of Care [facility]) - Progression Note    Patient Details  Name: Brandi Hoover MRN: [id number] Date of Birth: 08-Dec-1940  Transition of Care [facility]) CM/SW [place], RN Phone Number: 01/12/2021, 3:33 PM  Clinical Narrative:   Spoke to patient and daughter.  Bed offers are from [facility], [facility] and [facility].  Patient states she would like [facility], explained that [facility] has no beds currently, and they did not make a bed offer.  Patient and daughter would like the evening to consider the facilities and contact TOC with their preference.  TOC to follow.    Expected Discharge Plan:  (Home health vs snf) Barriers to Discharge: Continued Medical Work up  Expected Discharge Plan and Services Expected Discharge Plan:  (Home health vs snf)   Discharge Planning Services: CM Consult   Living arrangements for the past 2 months: Single Family Home                                       Social Determinants of Health (SDOH) Interventions    Readmission Risk Interventions No flowsheet data found.

## 2021-01-12 NOTE — Progress Notes (Signed)
PROGRESS NOTE  Brandi Hoover IFO:[id number] DOB: [birth year] DOA: 01/06/2021 PCP: Harrel Lemon, MD  Brief History   Brandi Hoover is a 80 y.o. female with hx of asthma/COPD, diabetes mellitus type 2, hypertension, obesity who presented with complaints of shortness of breath and wheezing for the past few days.  Patient stated that her daughter has been ill with some respiratory illness.  The patient states that she is feeling a little better today. Today oxygen requirements have dropped to 6L by [place] maintain saturations in the 90's.  No new complaints.  Consultants  None  Procedures  None  Antibiotics   Anti-infectives (From admission, onward)    None      Subjective  The patient is resting comfortably. She is feeling better. No new complaints.  Objective   Vitals:  Vitals:   01/12/21 1137 01/12/21 1345  BP: (!) 148/68   Pulse: 88   Resp: 18   Temp: 97.9 F (36.6 C)   SpO2:  90%    Exam:  Constitutional:  The patient is awake, alert, and oriented x 3. No acute distress. Respiratory:  No increased work of breathing. Coarse rhonchi throughout. No wheezes or rales No tactile fremitus Cardiovascular:  Regular rate and rhythm No murmurs, ectopy, or gallups. No lateral PMI. No thrills. Abdomen:  Abdomen is soft, non-tender, non-distended No hernias, masses, or organomegaly Normoactive bowel sounds.  Musculoskeletal:  No cyanosis, clubbing, or edema Skin:  No rashes, lesions, ulcers palpation of skin: no induration or nodules Neurologic:  CN 2-12 intact Sensation all 4 extremities intact Psychiatric:  Mental status Mood, affect appropriate Orientation to person, place, time  judgment and insight appear intact  I have personally reviewed the following:   Today's Data  Vitals  Lab Data  Glucoses  Micro Data  Viral respiratory panel positive for metapneumovirus  Imaging  CXR  Cardiology Data  EKG Echocardiogram  Scheduled Meds:   amLODipine  10 mg Oral Daily   ascorbic acid  250 mg Oral Daily   benazepril  20 mg Oral BID   enoxaparin (LOVENOX) injection  40 mg Subcutaneous Q24H   feeding supplement (GLUCERNA SHAKE)  237 mL Oral BID BM   fluticasone-salmeterol  1 puff Inhalation BID   hydrochlorothiazide  12.5 mg Oral BID   insulin aspart  0-15 Units Subcutaneous TID WC   insulin aspart  0-5 Units Subcutaneous QHS   insulin glargine-yfgn  30 Units Subcutaneous Daily   ipratropium  0.5 mg Nebulization Q6H   levalbuterol  1.25 mg Nebulization Q6H   mouth rinse  15 mL Mouth Rinse BID   multivitamin with minerals  1 tablet Oral Daily   predniSONE  60 mg Oral Q breakfast   Ensure Max Protein  11 oz Oral QHS   simvastatin  20 mg Oral QHS    Principal Problem:   Respiratory failure with hypoxia (HCC) Active Problems:   COPD (chronic obstructive pulmonary disease) (HCC)   Anemia   Hypertension   Multinodular goiter   Stage 3b chronic kidney disease (HCC)   Type 2 diabetes mellitus with stage 3b chronic kidney disease, without long-term current use of insulin (HCC)   Acute respiratory failure with hypoxia (HCC)   LOS: 5 days    A & P  # Acute hypoxemic respiratory failure 2/2 Metapneumovirus PNA --The patient has required up to 10 L O2 by high-flow [place] to maintain O2 sats in low 90's. Today she is requiring 6L. Will continue to wean as possible.  --  CXR relatively clear, no fever, no leukocytosis, no strong evidence of bacterial PNA. PLAN:  --Continue supplemental O2 to keep sats between 88-92%, wean as tolerated   # Asthma/COPD exacerbation --Pt had both dx in medical records.  --presented with significant dyspnea, rhonchi and wheezes, increased sputum production, and hypoxemia.  Exacerbation triggered by viral PNA. --started on IV solumedrol 40 mg q12h Plan: --Wean steroids to prednisone 60 mg daily --schedule ipratropium and Xopenex nebs q4h --cont home Advair as Dulera   HTN --cont amlodipine,  benazepril and HCTZ   CKD 3A --baseline around 1.5-1.6.  Creatinine today is down to 1.75 down from a zenith of 1.99. Monitor creatinine, electrolytes and volume status. Avoid nephrotoxins and hypotension.   DM2 --pt takes metformin, glipizide and JANUVIA at home. --hold home agents --ACHS and SSI for now - Increase lantus to 35 units daily. Will wean back as steroids are weaned down.  Morbid Obesity:  BMI 42.03. Complicates all cares. Recommend sensible weight loss through diet modification and increased activity as guided by the patient's PCP as outpatient.  I have seen and examined this patient myself. I have spent 30 minutes in her evaluation and care.   DVT prophylaxis: Lovenox SQ Code Status: Full code pt confirmed she would want intubation. Family Communication: daughter at bedside   Level of care: Telemetry Medical Dispo:   The patient is from: home Anticipated d/c is to: home Anticipated d/c date is: > 3 days Patient currently is not medically ready to d/c due to: 6L new O2 requirement  Aliannah Holstrom, DO [facility] Direct contact: see [email or web address]  7PM-7AM contact night coverage as above 01/12/2021, 2:42 PM  LOS: 1 day

## 2021-01-13 DIAGNOSIS — I10 Essential (primary) hypertension: Secondary | ICD-10-CM | POA: Diagnosis not present

## 2021-01-13 DIAGNOSIS — D649 Anemia, unspecified: Secondary | ICD-10-CM | POA: Diagnosis not present

## 2021-01-13 DIAGNOSIS — J441 Chronic obstructive pulmonary disease with (acute) exacerbation: Secondary | ICD-10-CM | POA: Diagnosis not present

## 2021-01-13 DIAGNOSIS — J9601 Acute respiratory failure with hypoxia: Secondary | ICD-10-CM | POA: Diagnosis not present

## 2021-01-13 LAB — CBC WITH DIFFERENTIAL/PLATELET
Abs Immature Granulocytes: 0.17 10*3/uL — ABNORMAL HIGH (ref 0.00–0.07)
Basophils Absolute: 0 10*3/uL (ref 0.0–0.1)
Basophils Relative: 0 %
Eosinophils Absolute: 0 10*3/uL (ref 0.0–0.5)
Eosinophils Relative: 0 %
HCT: 34.2 % — ABNORMAL LOW (ref 36.0–46.0)
Hemoglobin: 10.8 g/dL — ABNORMAL LOW (ref 12.0–15.0)
Immature Granulocytes: 2 %
Lymphocytes Relative: 17 %
Lymphs Abs: 2 10*3/uL (ref 0.7–4.0)
MCH: 29.7 pg (ref 26.0–34.0)
MCHC: 31.6 g/dL (ref 30.0–36.0)
MCV: 94 fL (ref 80.0–100.0)
Monocytes Absolute: 0.9 10*3/uL (ref 0.1–1.0)
Monocytes Relative: 7 %
Neutro Abs: 8.6 10*3/uL — ABNORMAL HIGH (ref 1.7–7.7)
Neutrophils Relative %: 74 %
Platelets: 257 10*3/uL (ref 150–400)
RBC: 3.64 MIL/uL — ABNORMAL LOW (ref 3.87–5.11)
RDW: 12.9 % (ref 11.5–15.5)
WBC: 11.7 10*3/uL — ABNORMAL HIGH (ref 4.0–10.5)
nRBC: 0 % (ref 0.0–0.2)

## 2021-01-13 LAB — GLUCOSE, CAPILLARY
Glucose-Capillary: 165 mg/dL — ABNORMAL HIGH (ref 70–99)
Glucose-Capillary: 203 mg/dL — ABNORMAL HIGH (ref 70–99)
Glucose-Capillary: 255 mg/dL — ABNORMAL HIGH (ref 70–99)
Glucose-Capillary: 155 mg/dL — ABNORMAL HIGH (ref 70–99)

## 2021-01-13 LAB — BASIC METABOLIC PANEL
Anion gap: 6 (ref 5–15)
BUN: 60 mg/dL — ABNORMAL HIGH (ref 8–23)
CO2: 32 mmol/L (ref 22–32)
Calcium: 8.4 mg/dL — ABNORMAL LOW (ref 8.9–10.3)
Chloride: 105 mmol/L (ref 98–111)
Creatinine, Ser: 1.79 mg/dL — ABNORMAL HIGH (ref 0.44–1.00)
GFR, Estimated: 28 mL/min — ABNORMAL LOW (ref >60)
Glucose, Bld: 165 mg/dL — ABNORMAL HIGH (ref 70–99)
Potassium: 4.5 mmol/L (ref 3.5–5.1)
Sodium: 143 mmol/L (ref 135–145)

## 2021-01-13 MED ORDER — IPRATROPIUM BROMIDE 0.02 % IN SOLN
0.5000 mg | Freq: Three times a day (TID) | RESPIRATORY_TRACT | Status: DC
  Administered 2021-01-13 – 2021-01-14 (×5): 0.5 mg via RESPIRATORY_TRACT
  Filled 2021-01-13 (×4): qty 2.5

## 2021-01-13 MED ORDER — LEVALBUTEROL HCL 1.25 MG/0.5ML IN NEBU
1.2500 mg | INHALATION_SOLUTION | Freq: Three times a day (TID) | RESPIRATORY_TRACT | Status: DC
Start: 2021-01-13 — End: 2021-01-14
  Administered 2021-01-13 – 2021-01-14 (×5): 1.25 mg via RESPIRATORY_TRACT
  Filled 2021-01-13 (×3): qty 0.5

## 2021-01-13 MED ORDER — DOCUSATE SODIUM 100 MG PO CAPS: 200.0000 mg | ORAL_CAPSULE | Freq: Once | ORAL | Status: DC

## 2021-01-13 MED ORDER — ENOXAPARIN SODIUM 60 MG/0.6ML IJ SOSY
0.5000 mg/kg | PREFILLED_SYRINGE | INTRAMUSCULAR | Status: DC
  Administered 2021-01-13 – 2021-01-15 (×3): 60 mg via SUBCUTANEOUS
  Filled 2021-01-13 (×3): qty 0.6

## 2021-01-13 NOTE — Progress Notes (Signed)
Anticoagulation monitoring(Lovenox):  80yo  F ordered Lovenox 40 mg Q24h    Filed Weights   01/06/21 1205  Weight: 119.9 kg (264 lb 5.3 oz)   BMI 42   Lab Results  Component Value Date   CREATININE 1.79 (H) 01/13/2021   CREATININE 1.75 (H) 01/10/2021   CREATININE 1.99 (H) 01/09/2021   Estimated Creatinine Clearance: 32.2 mL/min (A) (by C-G formula based on SCr of 1.79 mg/dL (H)). Hemoglobin & Hematocrit     Component Value Date/Time   HGB 10.8 (L) 01/13/2021 0602   HCT 34.2 (L) 01/13/2021 0602     Per Protocol for Patient with estCrcl >30 ml/min and BMI > 30, will transition to Lovenox 0.5 mg/kg  (60 mg) Q24h      Chinita Greenland PharmD Clinical Pharmacist 01/13/2021

## 2021-01-13 NOTE — Progress Notes (Signed)
Occupational Therapy Treatment Patient Details Name: Brandi Hoover MRN: [id number] DOB: 1940/12/01 Today's Date: 01/13/2021   History of present illness Brandi Hoover is an 1yoF who comes to [facility] on 01/06/21 c weakness, SOB, wheezing. PMH: asthma, DM2, HTN, obesity. Pt admitted with ARF c hypoxia. At baseline pt lives alone with 24/5 supervision/asssist from DTR, household AMB c 4WW, no falls in past 6 months.   OT comments  Pt seen for OT tx this date. Pt received in bed on bed pan with daughter in room. Pt endorses difficulty having BM. RN notified of pt's request for something to address constipation. Pt/daughter educated in bed level exercises and benefits of mobility and engagement in ADL to support strength and endurance building outside of therapy sessions in order to help pt progress further faster, educated in benefits of BSC versus bed pan, RN notified of pt's request for something to address constipation. Pt/family education in rehab process. OT made plan to return this afternoon for [facility] transfer training with pt agreeable.    Recommendations for follow up therapy are one component of a multi-disciplinary discharge planning process, led by the attending physician.  Recommendations may be updated based on patient status, additional functional criteria and insurance authorization.    Follow Up Recommendations  Skilled nursing-short term rehab (<3 hours/day)    Assistance Recommended at Discharge Intermittent Supervision/Assistance  Equipment Recommendations  BSC/3in1    Recommendations for Other Services      Precautions / Restrictions Precautions Precautions: Fall Precaution Comments: monitor O2 sats, HR Restrictions Weight Bearing Restrictions: No       Mobility Bed Mobility                    Transfers                         Balance                                           ADL either performed or assessed with clinical judgement    ADL Overall ADL's : Needs assistance/impaired                           Toilet Transfer Details (indicate cue type and reason): pt on bed pan, declined OT to assist or remove after pt endorsed unable to have BM                Extremity/Trunk Assessment              Vision       Perception     Praxis      Cognition Arousal/Alertness: Awake/alert Behavior During Therapy: Anxious Overall Cognitive Status: Within Functional Limits for tasks assessed                                 General Comments: Pt anxious, declining any EOB/OOB attempts          Exercises Other Exercises Other Exercises: Pt/daughter educated in bed level exercises and benefits of mobility and engagement in ADL to support strength and endurance building outside of therapy sessions in order to help pt progress further faster, educated in benefits of BSC versus bed pan, RN notified of pt's request for something to address constipation.  Pt/family education in rehab process.   Shoulder Instructions       General Comments      Pertinent Vitals/ Pain          Home Living                                          Prior Functioning/Environment              Frequency  Min 2X/week        Progress Toward Goals  OT Goals(current goals can now be found in the care plan section)  Progress towards OT goals: OT to reassess next treatment  Acute Rehab OT Goals Patient Stated Goal: breathe better OT Goal Formulation: With patient/family Time For Goal Achievement: 01/22/21 Potential to Achieve Goals: Good  Plan Discharge plan remains appropriate;Frequency remains appropriate    Co-evaluation                 AM-PAC OT "6 Clicks" Daily Activity     Outcome Measure   Help from another person eating meals?: None Help from another person taking care of personal grooming?: None Help from another person toileting, which includes using  toliet, bedpan, or urinal?: A Lot Help from another person bathing (including washing, rinsing, drying)?: A Lot Help from another person to put on and taking off regular upper body clothing?: A Little Help from another person to put on and taking off regular lower body clothing?: A Lot 6 Click Score: 17    End of Session Equipment Utilized During Treatment: Oxygen  OT Visit Diagnosis: Other abnormalities of gait and mobility (R26.89);Muscle weakness (generalized) (M62.81)   Activity Tolerance Patient tolerated treatment well   Patient Left in bed;with call bell/phone within reach;with bed alarm set;with family/visitor present   Nurse Communication Other (comment) (constipation)        Time: 2426-8341 OT Time Calculation (min): 9 min  Charges: OT General Charges $OT Visit: 1 Visit OT Treatments $Therapeutic Activity: 8-22 mins  Ardeth Perfect., MPH, MS, OTR/L ascom [phone] 01/13/21, 12:09 PM

## 2021-01-13 NOTE — TOC Progression Note (Signed)
Transition of Care [facility]) - Progression Note    Patient Details  Name: ANAPAOLA KINSEL MRN: [id number] Date of Birth: 26-Aug-1940  Transition of Care [facility]) CM/SW [place], RN Phone Number: 01/13/2021, 1:08 PM  Clinical Narrative:   Patient and family chose [facility] for discharge.    Expected Discharge Plan:  (Home health vs snf) Barriers to Discharge: Continued Medical Work up  Expected Discharge Plan and Services Expected Discharge Plan:  (Home health vs snf)   Discharge Planning Services: CM Consult   Living arrangements for the past 2 months: Single Family Home                                       Social Determinants of Health (SDOH) Interventions    Readmission Risk Interventions No flowsheet data found.

## 2021-01-13 NOTE — Progress Notes (Signed)
PROGRESS NOTE  Brandi Hoover WNI:[id number] DOB: 02/07/1941 DOA: 01/06/2021 PCP: Harrel Lemon, MD  Brief History   Brandi Hoover is a 80 y.o. female with hx of asthma/COPD, diabetes mellitus type 2, hypertension, obesity who presented with complaints of shortness of breath and wheezing for the past few days.  Patient stated that her daughter has been ill with some respiratory illness.  The patient states that she is feeling a little better today. Today oxygen requirements have dropped to 4L by [place] maintain saturations in the 90's.  No new complaints.  Consultants  None  Procedures  None  Antibiotics   Anti-infectives (From admission, onward)    None      Subjective  The patient is resting comfortably. She is feeling better. No new complaints.  Objective   Vitals:  Vitals:   01/13/21 0752 01/13/21 1142  BP:  (!) 169/56  Pulse:  82  Resp:  20  Temp:  97.7 F (36.5 C)  SpO2: 90% (!) 82%    Exam:  Constitutional:  The patient is awake, alert, and oriented x 3. No acute distress. Respiratory:  No increased work of breathing. Coarse rhonchi throughout. No wheezes or rales No tactile fremitus Cardiovascular:  Regular rate and rhythm No murmurs, ectopy, or gallups. No lateral PMI. No thrills. Abdomen:  Abdomen is soft, non-tender, non-distended No hernias, masses, or organomegaly Normoactive bowel sounds.  Musculoskeletal:  No cyanosis, clubbing, or edema Skin:  No rashes, lesions, ulcers palpation of skin: no induration or nodules Neurologic:  CN 2-12 intact Sensation all 4 extremities intact Psychiatric:  Mental status Mood, affect appropriate Orientation to person, place, time  judgment and insight appear intact  I have personally reviewed the following:   Today's Data  Vitals  Lab Data  Glucoses  Micro Data  Viral respiratory panel positive for metapneumovirus  Imaging  CXR  Cardiology Data  EKG Echocardiogram  Scheduled Meds:   amLODipine  10 mg Oral Daily   ascorbic acid  250 mg Oral Daily   benazepril  20 mg Oral BID   enoxaparin (LOVENOX) injection  0.5 mg/kg Subcutaneous Q24H   feeding supplement (GLUCERNA SHAKE)  237 mL Oral BID BM   fluticasone-salmeterol  1 puff Inhalation BID   hydrochlorothiazide  12.5 mg Oral BID   insulin aspart  0-15 Units Subcutaneous TID WC   insulin aspart  0-5 Units Subcutaneous QHS   insulin glargine-yfgn  35 Units Subcutaneous Daily   ipratropium  0.5 mg Nebulization TID   levalbuterol  1.25 mg Nebulization TID   mouth rinse  15 mL Mouth Rinse BID   multivitamin with minerals  1 tablet Oral Daily   predniSONE  60 mg Oral Q breakfast   Ensure Max Protein  11 oz Oral QHS   simvastatin  20 mg Oral QHS    Principal Problem:   Respiratory failure with hypoxia (HCC) Active Problems:   COPD (chronic obstructive pulmonary disease) (HCC)   Anemia   Hypertension   Multinodular goiter   Stage 3b chronic kidney disease (HCC)   Type 2 diabetes mellitus with stage 3b chronic kidney disease, without long-term current use of insulin (HCC)   Acute respiratory failure with hypoxia (HCC)   LOS: 6 days    A & P  Acute hypoxemic respiratory failure 2/2 Metapneumovirus PNA --The patient has required up to 10 L O2 by high-flow [place] to maintain O2 sats in low 90's. Today she is requiring 6L. Will continue to wean as possible.  --  CXR relatively clear, no fever, no leukocytosis, no strong evidence of bacterial PNA. PLAN:  --Continue supplemental O2 to keep sats between 88-92%, wean as tolerated.   Asthma/COPD exacerbation --Pt had both dx in medical records.  --presented with significant dyspnea, rhonchi and wheezes, increased sputum production, and hypoxemia.  Exacerbation triggered by viral PNA. --started on IV solumedrol 40 mg q12h Plan: --Wean steroids to prednisone 60 mg daily --schedule ipratropium and Xopenex nebs q4h --cont home Advair as Dulera   HTN --cont amlodipine,  benazepril and HCTZ   CKD 3A --baseline around 1.5-1.6.  Creatinine today is down to 1.75 down from a zenith of 1.99. Monitor creatinine, electrolytes and volume status. Avoid nephrotoxins and hypotension.   DM2 --pt takes metformin, glipizide and JANUVIA at home. --hold home agents --ACHS and SSI for now - Increase lantus to 35 units daily. Will wean back as steroids are weaned down.  Morbid Obesity:  BMI 42.03. Complicates all cares. Recommend sensible weight loss through diet modification and increased activity as guided by the patient's PCP as outpatient.  Debility: Recommendation from PT/OT is for SNF. The patient is requiring max assist to stand to get to [facility]. The patient's daughter is very willing to be available to help the patient, however with her MS the patient's daughter is barely able to walk herself. SNF seems to be the only safe discharge for this patient.  I have seen and examined this patient myself. I have spent 30 minutes in her evaluation and care.   DVT prophylaxis: Lovenox SQ Code Status: Full code pt confirmed she would want intubation. Family Communication: daughter at bedside   Level of care: Telemetry Medical Dispo:   The patient is from: home Anticipated d/c is to: SNF Anticipated d/c date is: > 3 days Patient currently is not medically ready to d/c due to: 4L new O2 requirement  Brandi Linden, DO [facility] Direct contact: see [email or web address]  7PM-7AM contact night coverage as above 01/13/2021, 2:36 PM  LOS: 1 day

## 2021-01-13 NOTE — Care Management Important Message (Signed)
Important Message  Patient Details  Name: Brandi Hoover MRN: [id number] Date of Birth: Jan 12, 1941   Medicare Important Message Given:  Other (see comment)  Patient is in an isolation room so I call her room [phone]) to review the Important Message from Medicare with her but there was no answer. Will try again.    [name] A Jadiel Schmieder 01/13/2021, 2:26 PM

## 2021-01-13 NOTE — Progress Notes (Signed)
Occupational Therapy Treatment Patient Details Name: Brandi Hoover MRN: [id number] DOB: [birth year] Today's Date: 01/13/2021   History of present illness Brandi Hoover is an 56yoF who comes to [facility] on 01/06/21 c weakness, SOB, wheezing. PMH: asthma, DM2, HTN, obesity. Pt admitted with ARF c hypoxia. At baseline pt lives alone with 24/5 supervision/asssist from DTR, household AMB c 4WW, no falls in past 6 months.   OT comments  Pt seen for 2nd OT tx this afternoon to address [facility] transfers. Pt required supervision and verbal cues to use the bed rail to perform supine>sit EOB and 5+ minutes to recover with VC for PLB. MAX A for donning shoes. She stood with MIN A +2 for safety and MIN A for step pivot to [facility] with MAX VC for hand placement to help lower herself to the [facility]. MAX A in standing for pericare/hygiene after toileting and CGA-MIN A for static standing with RW during toileting care. Pt then performed step pivot to the recliner with similar assist levels and VC required. Pt SpO2 dropped to mid- 80's with exertion, improving within 1-3 minutes to low 90's on 4L O2. Pt required frequent VC to breathe in through her nose, as she is a heavy mouth breather. Pt/dtr educated in incentive spirometer use and benefits of OOB to recliner for lung function. Pt also required step by step cues and OT sharing plan before each movement to help her prepare for exertional activity, as she endorses being anxious, fearful of falling, and lacks self confidence. Pt progressing towards goals, down from 10L O2 and requiring less overall assist than previous OT session. Continues to benefit from skilled OT services and continue to strongly recommend SNF at this time.    Recommendations for follow up therapy are one component of a multi-disciplinary discharge planning process, led by the attending physician.  Recommendations may be updated based on patient status, additional functional criteria and insurance authorization.     Follow Up Recommendations  Skilled nursing-short term rehab (<3 hours/day)    Assistance Recommended at Discharge Intermittent Supervision/Assistance  Equipment Recommendations  BSC/3in1    Recommendations for Other Services      Precautions / Restrictions Precautions Precautions: Fall Precaution Comments: monitor O2 sats, HR Restrictions Weight Bearing Restrictions: No       Mobility Bed Mobility Overal bed mobility: Needs Assistance Bed Mobility: Supine to Sit     Supine to sit: Supervision;HOB elevated     General bed mobility comments: increased time/effort, VC for use of bed rail but no direct assist required    Transfers Overall transfer level: Needs assistance Equipment used: Rolling walker (2 wheels) Transfers: Sit to/from Stand Sit to Stand: From elevated surface;Min assist;+2 safety/equipment   Step pivot transfers: Min assist;+2 safety/equipment;From elevated surface       General transfer comment: MAX VC for sequencing/safety, pt attempting to sit prematurely to [facility] and to recliner     Balance Overall balance assessment: Needs assistance Sitting-balance support: Feet supported;Single extremity supported Sitting balance-Leahy Scale: Fair     Standing balance support: Bilateral upper extremity supported;During functional activity;Reliant on assistive device for balance Standing balance-Leahy Scale: Poor Standing balance comment: requires some assist to maintain balance                           ADL either performed or assessed with clinical judgement   ADL Overall ADL's : Needs assistance/impaired  Lower Body Dressing: Sitting/lateral leans;Maximal assistance Lower Body Dressing Details (indicate cue type and reason): MAX A for donning shoes Toilet Transfer: Minimal assistance;BSC/3in1;Rolling walker (2 wheels);+2 for safety/equipment Toilet Transfer Details (indicate cue type and reason): MAX VC for  sequencing, safety Toileting- Clothing Manipulation and Hygiene: Sit to/from stand;+2 for safety/equipment;Maximal assistance Toileting - Clothing Manipulation Details (indicate cue type and reason): CGA-MIN A in standing while +2 provided max A for pericare     Functional mobility during ADLs: Minimal assistance;+2 for physical assistance;+2 for safety/equipment;Cueing for safety;Cueing for sequencing;Rolling walker (2 wheels) General ADL Comments: VC to manage/support anxiety/poor self confidence/fear of falling    Extremity/Trunk Assessment              Vision       Perception     Praxis      Cognition Arousal/Alertness: Awake/alert Behavior During Therapy: Anxious Overall Cognitive Status: Within Functional Limits for tasks assessed                                 General Comments: anxious, fearful of falling, low self confidence          Exercises     Shoulder Instructions       General Comments      Pertinent Vitals/ Pain       Pain Assessment: No/denies pain  Home Living                                          Prior Functioning/Environment              Frequency  Min 2X/week        Progress Toward Goals  OT Goals(current goals can now be found in the care plan section)  Progress towards OT goals: Progressing toward goals  Acute Rehab OT Goals Patient Stated Goal: breathe better OT Goal Formulation: With patient/family Time For Goal Achievement: 01/22/21 Potential to Achieve Goals: Good  Plan Discharge plan remains appropriate;Frequency remains appropriate    Co-evaluation                 AM-PAC OT "6 Clicks" Daily Activity     Outcome Measure   Help from another person eating meals?: None Help from another person taking care of personal grooming?: None Help from another person toileting, which includes using toliet, bedpan, or urinal?: A Lot Help from another person bathing (including  washing, rinsing, drying)?: A Lot Help from another person to put on and taking off regular upper body clothing?: A Little Help from another person to put on and taking off regular lower body clothing?: A Lot 6 Click Score: 17    End of Session Equipment Utilized During Treatment: Oxygen;Gait belt;Rolling walker (2 wheels)  OT Visit Diagnosis: Other abnormalities of gait and mobility (R26.89);Muscle weakness (generalized) (M62.81)   Activity Tolerance Patient tolerated treatment well   Patient Left in chair;with call bell/phone within reach;with chair alarm set;with family/visitor present   Nurse Communication Mobility status        Time: 6568-1275 OT Time Calculation (min): 42 min  Charges: OT General Charges $OT Visit: 1 Visit OT Treatments $Self Care/Home Management : 38-52 mins  Ardeth Perfect., MPH, MS, OTR/L ascom [phone] 01/13/21, 5:26 PM

## 2021-01-14 ENCOUNTER — Inpatient Hospital Stay: Payer: Medicare Other

## 2021-01-14 LAB — BASIC METABOLIC PANEL
Anion gap: 5 (ref 5–15)
BUN: 53 mg/dL — ABNORMAL HIGH (ref 8–23)
CO2: 32 mmol/L (ref 22–32)
Calcium: 8.2 mg/dL — ABNORMAL LOW (ref 8.9–10.3)
Chloride: 104 mmol/L (ref 98–111)
Creatinine, Ser: 1.83 mg/dL — ABNORMAL HIGH (ref 0.44–1.00)
GFR, Estimated: 27 mL/min — ABNORMAL LOW (ref >60)
Glucose, Bld: 106 mg/dL — ABNORMAL HIGH (ref 70–99)
Potassium: 4 mmol/L (ref 3.5–5.1)
Sodium: 141 mmol/L (ref 135–145)

## 2021-01-14 LAB — GLUCOSE, CAPILLARY
Glucose-Capillary: 97 mg/dL (ref 70–99)
Glucose-Capillary: 307 mg/dL — ABNORMAL HIGH (ref 70–99)
Glucose-Capillary: 312 mg/dL — ABNORMAL HIGH (ref 70–99)
Glucose-Capillary: 152 mg/dL — ABNORMAL HIGH (ref 70–99)

## 2021-01-14 LAB — BLOOD GAS, ARTERIAL
Acid-Base Excess: 6.6 mmol/L — ABNORMAL HIGH (ref 0.0–2.0)
Bicarbonate: 31.9 mmol/L — ABNORMAL HIGH (ref 20.0–28.0)
FIO2: 0.52
O2 Saturation: 93 %
Patient temperature: 37
pCO2 arterial: 48 mmHg (ref 32.0–48.0)
pH, Arterial: 7.43 (ref 7.350–7.450)
pO2, Arterial: 65 mmHg — ABNORMAL LOW (ref 83.0–108.0)

## 2021-01-14 LAB — D-DIMER, QUANTITATIVE: D-Dimer, Quant: 0.72 ug/mL-FEU — ABNORMAL HIGH (ref 0.00–0.50)

## 2021-01-14 LAB — MAGNESIUM: Magnesium: 2.7 mg/dL — ABNORMAL HIGH (ref 1.7–2.4)

## 2021-01-14 LAB — PROCALCITONIN: Procalcitonin: 0.13 ng/mL

## 2021-01-14 MED ORDER — INSULIN GLARGINE-YFGN 100 UNIT/ML ~~LOC~~ SOLN
30.0000 [IU] | Freq: Every day | SUBCUTANEOUS | Status: DC
  Administered 2021-01-15: 10:00:00 30 [IU] via SUBCUTANEOUS
  Filled 2021-01-14: qty 0.3

## 2021-01-14 MED ORDER — INSULIN ASPART 100 UNIT/ML IJ SOLN
3.0000 [IU] | Freq: Three times a day (TID) | INTRAMUSCULAR | Status: DC
  Administered 2021-01-14 – 2021-01-22 (×19): 3 [IU] via SUBCUTANEOUS
  Filled 2021-01-14 (×19): qty 1

## 2021-01-14 MED ORDER — IPRATROPIUM-ALBUTEROL 0.5-2.5 (3) MG/3ML IN SOLN
3.0000 mL | Freq: Four times a day (QID) | RESPIRATORY_TRACT | Status: DC
  Administered 2021-01-14 – 2021-01-23 (×34): 3 mL via RESPIRATORY_TRACT
  Filled 2021-01-14 (×35): qty 3

## 2021-01-14 NOTE — Progress Notes (Signed)
Physical Therapy Treatment Patient Details Name: Brandi Hoover MRN: [id number] DOB: [birth year] Today's Date: 01/14/2021   History of Present Illness Brandi Hoover is an 3yoF who comes to [facility] on 01/06/21 c weakness, SOB, wheezing. PMH: asthma, DM2, HTN, obesity. Pt admitted with ARF c hypoxia. At baseline pt lives alone with 24/5 supervision/asssist from DTR, household AMB c 4WW, no falls in past 6 months.    PT Comments    Pt was sitting EOB with supportive daughter at bedside. She had participated in OT ~ 15 minutes prior. Sao2 at 84-84% on 6L. Author increased to 8 L with cues to breath through her nose versus through her mouth. She requires cues for breathing techniques throughout session. Pt is extremely deconditioned and has poor response to limited activity. She stood 3 x EOB for ~ 30 sec each trial with prolonged recovery between trials. RR and HR elevate quickly with pt continuing to desaturate to low 80s. RN made aware and pt was left on 8L [place]. RT therapy contacted with pt's request for breathing treatment however pt unable until 2 pm. She will require extensive PT going forward to assist her to PLOF. Max assist required to return/reposition in bed. Acute PT will continue to follow and progress as able per current POC.    Recommendations for follow up therapy are one component of a multi-disciplinary discharge planning process, led by the attending physician.  Recommendations may be updated based on patient status, additional functional criteria and insurance authorization.  Follow Up Recommendations  Skilled nursing-short term rehab (<3 hours/day)     Assistance Recommended at Discharge Frequent or constant Supervision/Assistance  Equipment Recommendations   (defer to next level of care)       Precautions / Restrictions Precautions Precautions: Fall Precaution Comments: monitor O2 sats, HR Restrictions Weight Bearing Restrictions: No     Mobility  Bed Mobility Overal bed  mobility: Needs Assistance Bed Mobility: Sit to Supine       Sit to supine: Mod assist;Max assist   General bed mobility comments: Mod assist to return to long sitting from EOB. max assist to reposition to [facility] once in bed to promote improved posture.    Transfers Overall transfer level: Needs assistance Equipment used: Rolling walker (2 wheels) Transfers: Sit to/from Stand Sit to Stand: Min assist;From elevated surface (mod assist from lower surface height)           General transfer comment: Pt was able to stand 3 x EOB for ~ 30 sec each trial. Prolonged recovery time with HR and RR elevated. HR reaches 115 and sao2 desaturates to 82% however with 8 L o2 does recover. constant vcs for breathing through nose versus mouth. She endorses no symptoms of lightheadedness or dizziness    Ambulation/Gait      General Gait Details: pt was too fatigued to progress away form EOB however was able to march at EOB 3 x R/L prior to requesting a seated rest. Author electaed not to assist pt to recliner due to elevated RR with low sao2. RN aware      Balance Overall balance assessment: Needs assistance Sitting-balance support: Feet supported Sitting balance-Leahy Scale: Good Sitting balance - Comments: no balance s deficits while sitting EOB   Standing balance support: Bilateral upper extremity supported;During functional activity;Reliant on assistive device for balance Standing balance-Leahy Scale: Poor Standing balance comment: static standing fair, dynamic poor       Cognition Arousal/Alertness: Awake/alert Behavior During Therapy: WFL for tasks assessed/performed Overall  Cognitive Status: Within Functional Limits for tasks assessed        General Comments: Pt is A and O and agreeable to session. recently finished OT and was sitting EOB. Ot increased O2 from 4 L to 6L however upon arriving. sao2 84%,         Pertinent Vitals/Pain Pain Assessment: No/denies pain     PT  Goals (current goals can now be found in the care plan section) Acute Rehab PT Goals Patient Stated Goal: regain baseline mobility without dyspnea/hypoxia Progress towards PT goals: Progressing toward goals    Frequency    Min 2X/week      PT Plan Current plan remains appropriate       AM-PAC PT "6 Clicks" Mobility   Outcome Measure  Help needed turning from your back to your side while in a flat bed without using bedrails?: A Lot Help needed moving from lying on your back to sitting on the side of a flat bed without using bedrails?: A Lot Help needed moving to and from a bed to a chair (including a wheelchair)?: A Lot Help needed standing up from a chair using your arms (e.g., wheelchair or bedside chair)?: A Lot Help needed to walk in hospital room?: A Lot Help needed climbing 3-5 steps with a railing? : Total 6 Click Score: 11    End of Session Equipment Utilized During Treatment: Oxygen (4 L prior to OT, 6L after O2, 8 L after PT. RN aware. RT contacted but unable to perform breathing treatment until 2 pm) Activity Tolerance: Patient limited by fatigue Patient left: in bed;with family/visitor present;with call bell/phone within reach Nurse Communication: Mobility status PT Visit Diagnosis: Difficulty in walking, not elsewhere classified (R26.2);Other abnormalities of gait and mobility (R26.89);Muscle weakness (generalized) (M62.81)     Time: 3704-8889 PT Time Calculation (min) (ACUTE ONLY): 40 min  Charges:  $Therapeutic Activity: 38-52 mins                     Julaine Fusi PTA 01/14/21, 1:01 PM

## 2021-01-14 NOTE — NC FL2 (Signed)
[place] LEVEL OF CARE SCREENING TOOL     IDENTIFICATION  Patient Name: Brandi Hoover Birthdate: 08-08-1940 Sex: female Admission Date (Current Location): 01/06/2021  [facility] and [place] Number:  Engineering geologist and Address:  [facility], [address], [place], [place] [address]      Provider Number: [id number]  Attending Physician Name and Address:  Gwynne Edinger, MD  Relative Name and Phone Number:  Herbert Seta (Daughter)   [phone] [facility])    Current Level of Care: Hospital Recommended Level of Care: [place] Prior Approval Number:    Date Approved/Denied:   PASRR Number: [id number]  Discharge Plan:      Current Diagnoses: Patient Active Problem List   Diagnosis Date Noted   Acute respiratory failure with hypoxia ([place]) 01/07/2021   COPD (chronic obstructive pulmonary disease) ([place]) 01/06/2021   Anemia 01/06/2021   Hyperlipidemia 01/06/2021   Hypertension 01/06/2021   Multinodular goiter 01/06/2021   Type 2 diabetes mellitus with stage 3b chronic kidney disease, without long-term current use of insulin ([place]) 01/06/2021   Stage 3b chronic kidney disease ([place]) 12/25/2020   Respiratory failure with hypoxia ([place]) 07/14/2015   Pressure ulcer 07/14/2015    Orientation RESPIRATION BLADDER Height & Weight     Self, Situation, Place, Time  O2 Incontinent, Indwelling catheter Weight: 119.9 kg Height:  5' 6.5" (168.9 cm)  BEHAVIORAL SYMPTOMS/MOOD NEUROLOGICAL BOWEL NUTRITION STATUS      Incontinent Diet (carb modified)  AMBULATORY STATUS COMMUNICATION OF NEEDS Skin   Limited Assist Verbally Bruising, Other (Comment) (rash)                       Personal Care Assistance Level of Assistance  Bathing, Feeding, Dressing Bathing Assistance: Limited assistance Feeding assistance: Independent Dressing Assistance: Limited assistance     Functional Limitations Info              SPECIAL CARE FACTORS FREQUENCY  PT (By licensed PT), OT (By licensed OT)     PT Frequency: 5 times per week OT Frequency: 5 times per week            Contractures      Additional Factors Info  Code Status, Allergies Code Status Info: full Allergies Info: Allergies: Mometasone Furo-formoterol Fum, Azithromycin, Gabapentin, Omeprazole, Other, Ramipril           Current Medications (01/14/2021):  This is the current hospital active medication list Current Facility-Administered Medications  Medication Dose Route Frequency Provider Last Rate Last Admin   acetaminophen (TYLENOL) tablet 650 mg  650 mg Oral Q6H PRN Para Skeans, MD       Or   acetaminophen (TYLENOL) suppository 650 mg  650 mg Rectal Q6H PRN Para Skeans, MD       amLODipine (NORVASC) tablet 10 mg  10 mg Oral Daily Florina Ou V, MD   10 mg at 01/14/21 5102   ascorbic acid (VITAMIN C) tablet 250 mg  250 mg Oral Daily Florina Ou V, MD   250 mg at 01/14/21 0821   benazepril (LOTENSIN) tablet 20 mg  20 mg Oral BID Para Skeans, MD   20 mg at 01/14/21 5852   benzonatate (TESSALON) capsule 100 mg  100 mg Oral TID PRN Sharion Settler, NP   100 mg at 01/08/21 0532   chlorpheniramine-HYDROcodone (TUSSIONEX) 10-8 MG/5ML suspension 5 mL  5 mL Oral Q12H PRN Swayze, [name], DO  enoxaparin (LOVENOX) injection 60 mg  0.5 mg/kg Subcutaneous Q24H Noralee Space, RPH   60 mg at 01/13/21 2132   feeding supplement (GLUCERNA SHAKE) (GLUCERNA SHAKE) liquid 237 mL  237 mL Oral BID BM Enzo Bi, MD   237 mL at 01/12/21 1446   fluticasone-salmeterol (ADVAIR) 250-50 MCG/ACT inhaler 1 puff  1 puff Inhalation BID Swayze, [name], DO   1 puff at 01/14/21 0823   guaiFENesin (MUCINEX) 12 hr tablet 1,200 mg  1,200 mg Oral BID PRN Swayze, [name], DO       guaiFENesin-dextromethorphan (ROBITUSSIN DM) 100-10 MG/5ML syrup 5 mL  5 mL Oral Q4H PRN Swayze, [name], DO       hydrALAZINE (APRESOLINE) injection 5 mg  5 mg Intravenous Q4H PRN Para Skeans,  MD   5 mg at 01/10/21 1758   hydrochlorothiazide (HYDRODIURIL) tablet 12.5 mg  12.5 mg Oral BID Para Skeans, MD   12.5 mg at 01/14/21 6629   insulin aspart (novoLOG) injection 0-15 Units  0-15 Units Subcutaneous TID WC Enzo Bi, MD   8 Units at 01/13/21 1709   insulin aspart (novoLOG) injection 0-5 Units  0-5 Units Subcutaneous QHS Enzo Bi, MD   2 Units at 01/12/21 2045   insulin glargine-yfgn (SEMGLEE) injection 35 Units  35 Units Subcutaneous Daily Swayze, [name], DO   35 Units at 01/13/21 1113   ipratropium (ATROVENT) nebulizer solution 0.5 mg  0.5 mg Nebulization TID Swayze, [name], DO   0.5 mg at 01/14/21 0758   levalbuterol (XOPENEX) nebulizer solution 1.25 mg  1.25 mg Nebulization TID Swayze, [name], DO   1.25 mg at 01/14/21 0757   MEDLINE mouth rinse  15 mL Mouth Rinse BID Enzo Bi, MD   15 mL at 01/14/21 4765   multivitamin with minerals tablet 1 tablet  1 tablet Oral Daily Enzo Bi, MD   1 tablet at 01/14/21 0822   ondansetron (ZOFRAN) tablet 4 mg  4 mg Oral Q6H PRN Para Skeans, MD       Or   ondansetron [facility]) injection 4 mg  4 mg Intravenous Q6H PRN Para Skeans, MD       predniSONE (DELTASONE) tablet 60 mg  60 mg Oral Q breakfast Swayze, [name], DO   60 mg at 01/14/21 0820   protein supplement (ENSURE MAX) liquid  11 oz Oral QHS Enzo Bi, MD   11 oz at 01/13/21 2131   simvastatin (ZOCOR) tablet 20 mg  20 mg Oral QHS Florina Ou V, MD   20 mg at 01/13/21 2131   traMADol (ULTRAM) tablet 50 mg  50 mg Oral Q6H PRN Para Skeans, MD         Discharge Medications: Please see discharge summary for a list of discharge medications.  Relevant Imaging Results:  Relevant Lab Results:   Additional Information SS #: [place], RN

## 2021-01-14 NOTE — Progress Notes (Signed)
Nutrition Follow-up  DOCUMENTATION CODES:   Morbid obesity  INTERVENTION:   -D/c Glucerna  -Continue MVI with minerals daily -Continue Ensure Max po daily, each supplement provides 150 kcal and 30 grams of protein.    NUTRITION DIAGNOSIS:   Increased nutrient needs related to chronic illness (COPD) as evidenced by estimated needs.  Ongoing  GOAL:   Patient will meet greater than or equal to 90% of their needs  Progressing   MONITOR:   PO intake, Supplement acceptance, Labs, Weight trends, Skin, I & O's  REASON FOR ASSESSMENT:   Consult Assessment of nutrition requirement/status  ASSESSMENT:   Brandi Hoover is a 80 y.o. female seen in ed with complaints of shortness of breath and wheezing for the past few days.  Patient states that her daughter has been ill with the flu and she has been exposed.  She denies any fevers chills chest pain palpitations headaches blurred vision speech or gait issues at baseline she states that she can keep her house clean and take care of herself her kitchen her home.  But right now she cannot.  Patient also denies any fevers chills abdominal pain nausea vomiting diarrhea urinary bladder issues skin or joint issues.  Patient states that she does note in addition to the shortness of breath gets worse when she is walking or when she is laying down and she prefers to sleep sitting up.  Patient reports that her right leg has been swelling for an unknown duration but usually does not get swelling in her legs.  And at the end of encounter patient reports chest discomfort that is with shortness of breath and on exertion.  Reviewed I/O's: -800 ml x 24 hours and -3.8 L since admission  Pt working with therapy at time of visit.   Per discussion with RN, pt consuming some of her meals, but also consuming outside food provided by family (ex [employer] biscuit). Pt does not like the Glucerna supplements, but will sometimes drink them when family encourages her.     Medications reviewed and include vitamin C and prednisone.   Labs reviewed: Mg: 2.7, CBGS: 97-365 (inpatient orders for glycemic control are 0-15 units insulin aspart TID with meals, 0-5 units insulin aspart daily at bedtime, and 35 units insulin glargine-yfgn daily).    Diet Order:   Diet Order             Diet Carb Modified Fluid consistency: Thin; Room service appropriate? Yes  Diet effective now                   EDUCATION NEEDS:   No education needs have been identified at this time  Skin:  Skin Assessment: Reviewed RN Assessment  Last BM:  01/13/21  Height:   Ht Readings from Last 1 Encounters:  01/06/21 5' 6.5" (1.689 m)    Weight:   Wt Readings from Last 1 Encounters:  01/06/21 119.9 kg    Ideal Body Weight:  60.2 kg  BMI:  Body mass index is 42.03 kg/m.  Estimated Nutritional Needs:   Kcal:  1900-2100  Protein:  105-120 grams  Fluid:  > 1.9 L    Loistine Chance, RD, LDN, [place] Registered Dietitian II Certified Diabetes Care and Education Specialist Please refer to [facility] for RD and/or RD on-call/weekend/after hours pager

## 2021-01-14 NOTE — Progress Notes (Signed)
Inpatient Diabetes Program Recommendations  AACE/ADA: New Consensus Statement on Inpatient Glycemic Control (2015)  Target Ranges:  Prepandial:   less than 140 mg/dL      Peak postprandial:   less than 180 mg/dL (1-2 hours)      Critically ill patients:  140 - 180 mg/dL   Lab Results  Component Value Date   GLUCAP 307 (H) 01/14/2021   HGBA1C 7.1 (H) 01/07/2021    Review of Glycemic Control  Latest Reference Range & Units 01/13/21 08:09 01/13/21 12:25 01/13/21 16:35 01/13/21 21:11 01/14/21 07:59 01/14/21 11:50  Glucose-Capillary 70 - 99 mg/dL 165 (H) 203 (H) 255 (H) 155 (H) 97 307 (H)  (H): Data is abnormally high Diabetes history: DM2 Outpatient Diabetes medications: Glipizide 10 mg BID, Metformin 1500 mg QAM, Metformin 1000 mg QPM, Januvia 25 mg daily (not taking per med list) Current orders for Inpatient glycemic control: Semglee 35 units qd, Novolog 0-15 units TID with meals, Novolog 0-5 units QHS; Prednisone 60 mg qd  Inpatient Diabetes Program Recommendations:   Please consider: -Decrease Semglee to 30 units (Fasting CBG 97) -Decrease Novolog correction to 0-9 units tid + hs 0-5 units -Add Novolog 3 units tid meal coverage if eats 50% while on steroids Secure chat sent to Dr. Si Raider.  Thank you, Nani Gasser. Marchelle Rinella, RN, MSN, CDE  Diabetes Coordinator Inpatient Glycemic Control Team Team Pager [phone] (8am-5pm) 01/14/2021 12:02 PM

## 2021-01-14 NOTE — Progress Notes (Signed)
PROGRESS NOTE  Brandi Hoover ZJQ:[id number] DOB: 1940/02/28 DOA: 01/06/2021 PCP: Harrel Lemon, MD  Brief History   Brandi Hoover is a 80 y.o. female with hx of asthma/COPD, diabetes mellitus type 2, hypertension, obesity who presented with complaints of shortness of breath and wheezing for the past few days.  Patient stated that her daughter has been ill with some respiratory illness.    Consultants  None  Procedures  None  Antibiotics   Anti-infectives (From admission, onward)    None      Subjective  The patient is resting comfortably. She is feeling improved. Less sob, speaking in full sentences, tolerating diet  Objective   Vitals:  Vitals:   01/14/21 0758 01/14/21 1149  BP: 134/82 140/65  Pulse: 79 85  Resp: 20 20  Temp: 98.1 F (36.7 C) 98 F (36.7 C)  SpO2: 90% 92%    Exam:  Constitutional:  The patient is awake, alert, and oriented x 3. No acute distress. Respiratory:  No increased work of breathing. Coarse rhonchi throughout. No wheezes or rales  Cardiovascular:  Regular rate and rhythm No murmurs, ectopy, or gallups. No lateral PMI. No thrills. Abdomen:  Abdomen is soft, non-tender, non-distended No hernias, masses, or organomegaly Normoactive bowel sounds.  Musculoskeletal:  No cyanosis, clubbing, or edema Skin:  No rashes, lesions, ulcers palpation of skin: no induration or nodules Neurologic:  Moving all 4 extremiteis Psychiatric:  Mental status Mood, affect appropriate Orientation to person, place, time  judgment and insight appear intact  I have personally reviewed the following:   Today's Data  Vitals  Lab Data  Glucoses  Micro Data  Viral respiratory panel positive for metapneumovirus  Imaging  CXR  Cardiology Data  EKG Echocardiogram  Scheduled Meds:  amLODipine  10 mg Oral Daily   ascorbic acid  250 mg Oral Daily   benazepril  20 mg Oral BID   enoxaparin (LOVENOX) injection  0.5 mg/kg Subcutaneous Q24H    fluticasone-salmeterol  1 puff Inhalation BID   hydrochlorothiazide  12.5 mg Oral BID   insulin aspart  0-15 Units Subcutaneous TID WC   insulin aspart  0-5 Units Subcutaneous QHS   insulin glargine-yfgn  35 Units Subcutaneous Daily   ipratropium  0.5 mg Nebulization TID   levalbuterol  1.25 mg Nebulization TID   mouth rinse  15 mL Mouth Rinse BID   multivitamin with minerals  1 tablet Oral Daily   predniSONE  60 mg Oral Q breakfast   Ensure Max Protein  11 oz Oral QHS   simvastatin  20 mg Oral QHS    Principal Problem:   Respiratory failure with hypoxia (HCC) Active Problems:   COPD (chronic obstructive pulmonary disease) (HCC)   Anemia   Hypertension   Multinodular goiter   Stage 3b chronic kidney disease (HCC)   Type 2 diabetes mellitus with stage 3b chronic kidney disease, without long-term current use of insulin (HCC)   Acute respiratory failure with hypoxia (HCC)   LOS: 7 days    A & P  Acute hypoxemic respiratory failure 2/2 Metapneumovirus PNA --The patient has required up to 10 L O2 by high-flow [place] to maintain O2 sats in low 90's. Yesterday weaned down to 4 L, today requiring 8 L to maintain o2 around 90, though symptomatically improving PLAN:  --Continue supplemental O2 to keep sats between 88-92%, wean as tolerated. - will check cxr, dimer, abg, procalcitonin\ - copd as below   Asthma/COPD exacerbation --Pt had both dx in  medical records.  --presented with significant dyspnea, rhonchi and wheezes, increased sputum production, and hypoxemia.  Exacerbation triggered by viral PNA. --started on IV solumedrol 40 mg q12h Plan: --cont prednisone 60 mg daily --schedule ipratropium and Xopenex nebs q4h --cont home Advair as Dulera - IS   HTN --cont amlodipine, HCTZ - holding benazepril given kidney function   CKD 3A --baseline gfr low 30s, has been upper 20s for past few days - hold acei as above - monitor   DM2 --pt takes metformin, glipizide and JANUVIA  at home. --hold home agents --ACHS and SSI for now - decrease semglee to 30 daily. Will wean back as steroids are weaned down.  Morbid Obesity:  BMI 42.03. Complicates all cares. Recommend sensible weight loss through diet modification and increased activity as guided by the patient's PCP as outpatient.  Debility: Recommendation from PT/OT is for SNF. The patient is requiring max assist to stand to get to [facility]. The patient's daughter is very willing to be available to help the patient, however with her MS the patient's daughter is barely able to walk herself. SNF seems to be the only safe discharge for this patient. Has bed.  I have seen and examined this patient myself. I have spent 35 minutes in her evaluation and care.   DVT prophylaxis: Lovenox SQ Code Status: Full code pt confirmed she would want intubation. Family Communication: daughter at bedside   Level of care: Telemetry Medical Dispo:   The patient is from: home Anticipated d/c is to: SNF Anticipated d/c date is: > 3 days Patient currently is not medically ready to d/c due to: severity of illness  Brandi Arrow, MD [facility] Direct contact: see [email or web address]  7PM-7AM contact night coverage as above

## 2021-01-14 NOTE — Progress Notes (Signed)
Occupational Therapy Treatment Patient Details Name: Brandi Hoover MRN: [id number] DOB: 03-26-1940 Today's Date: 01/14/2021   History of present illness Brandi Hoover is an 7yoF who comes to [facility] on 01/06/21 c weakness, SOB, wheezing. PMH: asthma, DM2, HTN, obesity. Pt admitted with ARF c hypoxia. At baseline pt lives alone with 24/5 supervision/asssist from DTR, household AMB c 4WW, no falls in past 6 months.   OT comments  Pt seen for OT tx this date to f/u re: safety with ADLs. OT engages pt in seated bathing tasks with MIN A and dressing with SETUP. Next, OT engages pt in STS x2 trials with MIN/MOD A from slightly elevated EOB surface. While in standing, pt requires MAX A for LB bathing/peri d/t poor dyanmic standing. Pt returned to EOB Sitting. Sats did drop with standing trials, as low as 84% on 4Lnc, so OT increased to 6L. Pt able to recover to 88-92% while in EOB sitting and left on 6L. She requests to stay sitting EOB to get a break from laying. Pt's daughter present and bed alarm set. RN notified. Will continue to follow; continue to anticipate that pt will require f/u services in STR setting.    Recommendations for follow up therapy are one component of a multi-disciplinary discharge planning process, led by the attending physician.  Recommendations may be updated based on patient status, additional functional criteria and insurance authorization.    Follow Up Recommendations  Skilled nursing-short term rehab (<3 hours/day)    Assistance Recommended at Discharge Intermittent Supervision/Assistance  Equipment Recommendations  BSC/3in1    Recommendations for Other Services      Precautions / Restrictions Precautions Precautions: Fall Precaution Comments: monitor O2 sats, HR Restrictions Weight Bearing Restrictions: No       Mobility Bed Mobility Overal bed mobility: Needs Assistance Bed Mobility: Supine to Sit     Supine to sit: Min assist;HOB elevated Sit to supine: Mod  assist;Max assist   General bed mobility comments: increased time and assist for trunk to come to EOB sitting    Transfers Overall transfer level: Needs assistance Equipment used: Rolling walker (2 wheels) Transfers: Sit to/from Stand Sit to Stand: Min assist;Mod assist;From elevated surface           General transfer comment: 2 STS trials with RW from slightly elevated EOB (~4") with posterior support to extend hips.     Balance Overall balance assessment: Needs assistance Sitting-balance support: Feet supported Sitting balance-Leahy Scale: Good Sitting balance - Comments: G static sitting   Standing balance support: Bilateral upper extremity supported;During functional activity;Reliant on assistive device for balance Standing balance-Leahy Scale: Poor Standing balance comment: Requires at least MIN A To sustain static standing with UE Support on RW                           ADL either performed or assessed with clinical judgement   ADL Overall ADL's : Needs assistance/impaired         Upper Body Bathing: Minimal assistance;Sitting   Lower Body Bathing: Maximal assistance;Sit to/from stand Lower Body Bathing Details (indicate cue type and reason): MIN/MOD A to come to standing x2 from EOB with RW, MAX A for actual posterior LB bathing. Pt able to perform anterior in sitting with SETUP. Upper Body Dressing : Set up;Sitting   Lower Body Dressing: Maximal assistance;Sitting/lateral leans Lower Body Dressing Details (indicate cue type and reason): shoes  Extremity/Trunk Assessment              Vision       Perception     Praxis      Cognition Arousal/Alertness: Awake/alert Behavior During Therapy: WFL for tasks assessed/performed Overall Cognitive Status: Within Functional Limits for tasks assessed                                 General Comments: A&O          Exercises Other Exercises Other  Exercises: OT engages pt in seated bathing tasks with MIN A and dressing with SETUP. Next, OT engages pt in STS x2 trials with MIN/MOD A from slightly elevated EOB surface. While in standing, pt requires MAX A for LB bathing/peri d/t poor dyanmic standing. Pt returned to EOB Sitting. Sats did drop with standing trials, as low as 84% on 4Lnc, so OT increased to 6L. Pt able to recover to 88-92% while in EOB sitting and left on 6L. She requests to stay sitting EOB to get a break from laying. Pt's daughter present and bed alarm set. RN notified.   Shoulder Instructions       General Comments      Pertinent Vitals/ Pain       Pain Assessment: No/denies pain  Home Living                                          Prior Functioning/Environment              Frequency  Min 2X/week        Progress Toward Goals  OT Goals(current goals can now be found in the care plan section)  Progress towards OT goals: Progressing toward goals  Acute Rehab OT Goals Patient Stated Goal: breathe better OT Goal Formulation: With patient/family Time For Goal Achievement: 01/22/21 Potential to Achieve Goals: Good  Plan Discharge plan remains appropriate;Frequency remains appropriate    Co-evaluation                 AM-PAC OT "6 Clicks" Daily Activity     Outcome Measure   Help from another person eating meals?: None Help from another person taking care of personal grooming?: None Help from another person toileting, which includes using toliet, bedpan, or urinal?: A Lot Help from another person bathing (including washing, rinsing, drying)?: A Lot Help from another person to put on and taking off regular upper body clothing?: A Little Help from another person to put on and taking off regular lower body clothing?: A Lot 6 Click Score: 17    End of Session Equipment Utilized During Treatment: Oxygen;Gait belt;Rolling walker (2 wheels)  OT Visit Diagnosis: Other  abnormalities of gait and mobility (R26.89);Muscle weakness (generalized) (M62.81)   Activity Tolerance Patient tolerated treatment well   Patient Left Other (comment);with family/visitor present (sitting EOB)   Nurse Communication Mobility status        Time: 5009-3818 OT Time Calculation (min): 36 min  Charges: OT General Charges $OT Visit: 1 Visit OT Treatments $Self Care/Home Management : 8-22 mins $Therapeutic Activity: 8-22 mins  Gerrianne Scale, MS, OTR/L ascom [phone] 01/14/21, 2:47 PM

## 2021-01-15 LAB — CBC
HCT: 35 % — ABNORMAL LOW (ref 36.0–46.0)
Hemoglobin: 10.9 g/dL — ABNORMAL LOW (ref 12.0–15.0)
MCH: 29.5 pg (ref 26.0–34.0)
MCHC: 31.1 g/dL (ref 30.0–36.0)
MCV: 94.9 fL (ref 80.0–100.0)
Platelets: 287 10*3/uL (ref 150–400)
RBC: 3.69 MIL/uL — ABNORMAL LOW (ref 3.87–5.11)
RDW: 13 % (ref 11.5–15.5)
WBC: 17.9 10*3/uL — ABNORMAL HIGH (ref 4.0–10.5)
nRBC: 0 % (ref 0.0–0.2)

## 2021-01-15 LAB — BASIC METABOLIC PANEL
Anion gap: 9 (ref 5–15)
BUN: 50 mg/dL — ABNORMAL HIGH (ref 8–23)
CO2: 32 mmol/L (ref 22–32)
Calcium: 8.4 mg/dL — ABNORMAL LOW (ref 8.9–10.3)
Chloride: 102 mmol/L (ref 98–111)
Creatinine, Ser: 1.82 mg/dL — ABNORMAL HIGH (ref 0.44–1.00)
GFR, Estimated: 27 mL/min — ABNORMAL LOW (ref >60)
Glucose, Bld: 66 mg/dL — ABNORMAL LOW (ref 70–99)
Potassium: 3.9 mmol/L (ref 3.5–5.1)
Sodium: 143 mmol/L (ref 135–145)

## 2021-01-15 LAB — GLUCOSE, CAPILLARY
Glucose-Capillary: 74 mg/dL (ref 70–99)
Glucose-Capillary: 198 mg/dL — ABNORMAL HIGH (ref 70–99)
Glucose-Capillary: 246 mg/dL — ABNORMAL HIGH (ref 70–99)
Glucose-Capillary: 192 mg/dL — ABNORMAL HIGH (ref 70–99)

## 2021-01-15 LAB — STREP PNEUMONIAE URINARY ANTIGEN: Strep Pneumo Urinary Antigen: NEGATIVE

## 2021-01-15 MED ORDER — SODIUM CHLORIDE 0.9 % IV SOLN
1.0000 g | INTRAVENOUS | Status: AC
  Administered 2021-01-15 – 2021-01-21 (×7): 1 g via INTRAVENOUS
  Filled 2021-01-15: qty 10
  Filled 2021-01-15 (×6): qty 1

## 2021-01-15 MED ORDER — DOXYCYCLINE HYCLATE 100 MG PO TABS
100.0000 mg | ORAL_TABLET | Freq: Two times a day (BID) | ORAL | Status: DC
  Administered 2021-01-15 – 2021-01-17 (×4): 100 mg via ORAL
  Filled 2021-01-15 (×4): qty 1

## 2021-01-15 MED ORDER — INSULIN GLARGINE-YFGN 100 UNIT/ML ~~LOC~~ SOLN
25.0000 [IU] | Freq: Every day | SUBCUTANEOUS | Status: DC
  Administered 2021-01-16 – 2021-01-17 (×2): 25 [IU] via SUBCUTANEOUS
  Filled 2021-01-15 (×2): qty 0.25

## 2021-01-15 MED ORDER — PREDNISONE 20 MG PO TABS
40.0000 mg | ORAL_TABLET | Freq: Every day | ORAL | Status: DC
  Administered 2021-01-16 – 2021-01-19 (×4): 40 mg via ORAL
  Filled 2021-01-15 (×4): qty 2

## 2021-01-15 MED ORDER — SALINE SPRAY 0.65 % NA SOLN
1.0000 | NASAL | Status: DC | PRN
  Administered 2021-01-16 (×2): 1 via NASAL
  Filled 2021-01-15: qty 44

## 2021-01-15 MED ORDER — GUAIFENESIN ER 600 MG PO TB12
1200.0000 mg | ORAL_TABLET | Freq: Two times a day (BID) | ORAL | Status: DC
  Administered 2021-01-15 – 2021-01-23 (×17): 1200 mg via ORAL
  Filled 2021-01-15 (×17): qty 2

## 2021-01-15 NOTE — Progress Notes (Signed)
PROGRESS NOTE  Brandi Hoover CNO:[id number] DOB: Sep 15, 1940 DOA: 01/06/2021 PCP: Harrel Lemon, MD  Brief History   Brandi Hoover is a 80 y.o. female with hx of asthma/COPD, diabetes mellitus type 2, hypertension, obesity who presented with complaints of shortness of breath and wheezing for the past few days.  Patient stated that her daughter has been ill with some respiratory illness.    Consultants  None  Procedures  None  Antibiotics   Anti-infectives (From admission, onward)    None      Subjective  The patient is resting comfortably. She is feeling improved. Less sob, speaking in full sentences, tolerating diet  Objective   Vitals:  Vitals:   01/15/21 1202 01/15/21 1340  BP: 138/79   Pulse: 90   Resp: 19   Temp: 97.8 F (36.6 C)   SpO2: 92% 92%    Exam:  Constitutional:  The patient is awake, alert, and oriented x 3. No acute distress. Respiratory:  No increased work of breathing. Coarse rhonchi throughout. No wheezes or rales  Cardiovascular:  Regular rate and rhythm No murmurs, ectopy, or gallups. No lateral PMI. No thrills. Abdomen:  Abdomen is soft, non-tender, non-distended No hernias, masses, or organomegaly Normoactive bowel sounds.  Musculoskeletal:  No cyanosis, clubbing, or edema Skin:  No rashes, lesions, ulcers palpation of skin: no induration or nodules Neurologic:  Moving all 4 extremiteis Psychiatric:  Mental status Mood, affect appropriate Orientation to person, place, time  judgment and insight appear intact  I have personally reviewed the following:   Today's Data  Vitals  Lab Data  Glucoses  Micro Data  Viral respiratory panel positive for metapneumovirus  Imaging  CXR  Cardiology Data  EKG Echocardiogram  Scheduled Meds:  amLODipine  10 mg Oral Daily   ascorbic acid  250 mg Oral Daily   enoxaparin (LOVENOX) injection  0.5 mg/kg Subcutaneous Q24H   fluticasone-salmeterol  1 puff Inhalation BID    guaiFENesin  1,200 mg Oral BID   hydrochlorothiazide  12.5 mg Oral BID   insulin aspart  0-15 Units Subcutaneous TID WC   insulin aspart  0-5 Units Subcutaneous QHS   insulin aspart  3 Units Subcutaneous TID WC   insulin glargine-yfgn  30 Units Subcutaneous Daily   ipratropium-albuterol  3 mL Nebulization Q6H   mouth rinse  15 mL Mouth Rinse BID   multivitamin with minerals  1 tablet Oral Daily   predniSONE  60 mg Oral Q breakfast   Ensure Max Protein  11 oz Oral QHS   simvastatin  20 mg Oral QHS    Principal Problem:   Respiratory failure with hypoxia (HCC) Active Problems:   COPD (chronic obstructive pulmonary disease) (HCC)   Anemia   Hypertension   Multinodular goiter   Stage 3b chronic kidney disease (HCC)   Type 2 diabetes mellitus with stage 3b chronic kidney disease, without long-term current use of insulin (HCC)   Acute respiratory failure with hypoxia (HCC)   LOS: 8 days    A & P  Acute hypoxemic respiratory failure 2/2 Metapneumovirus PNA --The patient has required up to 10 L O2 by high-flow [place] to maintain O2 sats in low 90's. Still requiring relatively high amount 7 L, though symptmatically improving. Dimer wnl for age. Procal is low but ct shows possible superimposed pneumonia. Atelectasis likely also contributing PLAN:  --Continue supplemental O2 to keep sats between 88-92%, wean as tolerated. - RT following, continuing flutter valve, IS. Will trial adding metanebs -  copd/cap as below as below   Asthma/COPD exacerbation Community acquired pneumonia --Pt had both dx in medical records.  --presented with significant dyspnea, rhonchi and wheezes, increased sputum production, and hypoxemia.  Exacerbation triggered by viral PNA, possible superimposed bacterial. --started on IV solumedrol 40 mg q12h Plan: --cont prednisone 40 mg daily --schedule ipratropium and Xopenex nebs q4h --cont home Advair as Dulera - urine antigens - mrsa screen - start ceftriaxone/doxy  (macrolide allergy)   HTN --cont amlodipine, HCTZ - holding benazepril given kidney function   CKD 3A --baseline gfr low 30s, has been upper 20s for past few days - hold acei as above - monitor   DM2 --pt takes metformin, glipizide and JANUVIA at home. --hold home agents --ACHS and SSI for now - decrease semglee to 25 daily given am hypoglycemia  Morbid Obesity:  BMI 42.03. Complicates all cares. Recommend sensible weight loss through diet modification and increased activity as guided by the patient's PCP as outpatient.  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 I have spent 35 minutes in her evaluation and care.   DVT prophylaxis: Lovenox SQ Code Status: Full code pt confirmed she would want intubation. Family Communication: daughter at bedside   Level of care: Telemetry Medical Dispo:   The patient is from: home Anticipated d/c is to: SNF Anticipated d/c date is: hopeful for 2-3 days Patient currently is not medically ready to d/c due to: severity of illness  [name], MD [facility] Direct contact: see [email or web address]  7PM-7AM contact night coverage as above

## 2021-01-15 NOTE — Progress Notes (Signed)
Physical Therapy Treatment Patient Details Name: Brandi Hoover MRN: [id number] DOB: 01-23-1941 Today's Date: 01/15/2021   History of Present Illness Brandi Hoover is an 59yoF who comes to [facility] on 01/06/21 c weakness, SOB, wheezing. PMH: asthma, DM2, HTN, obesity. Pt admitted with ARF c hypoxia. At baseline pt lives alone with 24/5 supervision/asssist from DTR, household AMB c 4WW, no falls in past 6 months.    PT Comments    Pt was long sitting in bed upon arriving. She is A and O x 4 and has supportive daughter at bedside. Pt is cooperative and motivated throughout. On 8 L HFNC upon arriving. She remained on 8 L throughout session however once in chair and resting, was able to wean to 7 L HFNC with sao2 >88%. Pt is severely deconditioned. She was able to exit R side of bed, stand, and take a few steps to recliner but needs increased time to perform all task. Author still feels pt will need SNF at DC to address deficits while maximizing independence with ADLs. Acute PT will continue to follow and progress pt as able per current POC.    Recommendations for follow up therapy are one component of a multi-disciplinary discharge planning process, led by the attending physician.  Recommendations may be updated based on patient status, additional functional criteria and insurance authorization.  Follow Up Recommendations  Skilled nursing-short term rehab (<3 hours/day)     Assistance Recommended at Discharge Frequent or constant Supervision/Assistance  Equipment Recommendations  Other (comment) (defer to next level of care)       Precautions / Restrictions Precautions Precautions: Fall Precaution Comments: monitor O2 sats, HR Restrictions Weight Bearing Restrictions: No     Mobility  Bed Mobility Overal bed mobility: Needs Assistance Bed Mobility: Supine to Sit     Supine to sit: Mod assist;HOB elevated     General bed mobility comments: Pt continues to require extensive assistance to  exit R sid eof bed. Vcs for technique and sequencing improvements.    Transfers Overall transfer level: Needs assistance Equipment used: Rolling walker (2 wheels) Transfers: Sit to/from Stand Sit to Stand: Min assist;Mod assist;From elevated surface           General transfer comment: Pt was able to stand from slightly eloevated bed height with increased time + vcs throughout for improve technique . she does continue to desaturate with minmal activity + needs extra time to recover.dropped to 84% on 8L HFNC    Ambulation/Gait Ambulation/Gait assistance: Min assist Gait Distance (Feet): 3 Feet Assistive device: Rolling walker (2 wheels) Gait Pattern/deviations: Step-to pattern Gait velocity: decrease     General Gait Details: pt ambulated from EOB to recliner. minimal distance due to fatigue. Will continue to progress activity tolerance as able per current POC      Balance Overall balance assessment: Needs assistance Sitting-balance support: Feet supported Sitting balance-Leahy Scale: Good     Standing balance support: Bilateral upper extremity supported;During functional activity;Reliant on assistive device for balance Standing balance-Leahy Scale: Fair Standing balance comment: pt was able to stand with UE support and take several steps without LOB however did require vcs fro imporve BOS to prevent LOB       Cognition Arousal/Alertness: Awake/alert Behavior During Therapy: WFL for tasks assessed/performed Overall Cognitive Status: Within Functional Limits for tasks assessed      General Comments: A&O x4               Pertinent Vitals/Pain Pain Assessment: No/denies pain  PT Goals (current goals can now be found in the care plan section) Acute Rehab PT Goals Patient Stated Goal: breath better Progress towards PT goals: Progressing toward goals    Frequency    Min 2X/week      PT Plan Current plan remains appropriate       AM-PAC PT "6  Clicks" Mobility   Outcome Measure  Help needed turning from your back to your side while in a flat bed without using bedrails?: A Little Help needed moving from lying on your back to sitting on the side of a flat bed without using bedrails?: A Lot Help needed moving to and from a bed to a chair (including a wheelchair)?: A Lot Help needed standing up from a chair using your arms (e.g., wheelchair or bedside chair)?: A Lot Help needed to walk in hospital room?: A Little Help needed climbing 3-5 steps with a railing? : A Lot 6 Click Score: 14    End of Session Equipment Utilized During Treatment: Oxygen (8 L HFNC upon arriving. dropped to 7L HFNC at conclusion while in recliner. RN is aware) Activity Tolerance: Patient limited by fatigue Patient left: in chair;with call bell/phone within reach;with chair alarm set;with nursing/sitter in room Nurse Communication: Mobility status PT Visit Diagnosis: Difficulty in walking, not elsewhere classified (R26.2);Other abnormalities of gait and mobility (R26.89);Muscle weakness (generalized) (M62.81)     Time: 8003-4917 PT Time Calculation (min) (ACUTE ONLY): 24 min  Charges:  $Therapeutic Activity: 23-37 mins                     Julaine Fusi PTA 01/15/21, 10:58 AM

## 2021-01-15 NOTE — Progress Notes (Signed)
Patient perfomed CPT metaneb to best of her ability but unable to complete full treatment due to fatigue.

## 2021-01-15 NOTE — Progress Notes (Signed)
Patient transferred from bed to chair with physical therapy with moderate assistance. Oxygen saturations decreased to 85% on 8L HFNC with exertion. Patient able to maintain 91% at rest in chair on 7L HFNC at present.

## 2021-01-15 NOTE — TOC Progression Note (Signed)
Transition of Care [facility]) - Progression Note    Patient Details  Name: Brandi Hoover MRN: [id number] Date of Birth: 08/12/1940  [facility] CM/SW [place], RN Phone Number: 01/15/2021, 4:47 PM  Clinical Narrative:   Continued medical workup.  Patient has high flow oxygen at this time.  TOC to follow to discharge.    Expected Discharge Plan:  (Home health vs snf) Barriers to Discharge: Continued Medical Work up  Expected Discharge Plan and Services Expected Discharge Plan:  (Home health vs snf)   Discharge Planning Services: CM Consult   Living arrangements for the past 2 months: Single Family Home                                       Social Determinants of Health (SDOH) Interventions    Readmission Risk Interventions No flowsheet data found.

## 2021-01-16 LAB — BASIC METABOLIC PANEL
Anion gap: 9 (ref 5–15)
BUN: 53 mg/dL — ABNORMAL HIGH (ref 8–23)
CO2: 30 mmol/L (ref 22–32)
Calcium: 8.1 mg/dL — ABNORMAL LOW (ref 8.9–10.3)
Chloride: 102 mmol/L (ref 98–111)
Creatinine, Ser: 1.99 mg/dL — ABNORMAL HIGH (ref 0.44–1.00)
GFR, Estimated: 25 mL/min — ABNORMAL LOW (ref >60)
Glucose, Bld: 155 mg/dL — ABNORMAL HIGH (ref 70–99)
Potassium: 4.1 mmol/L (ref 3.5–5.1)
Sodium: 141 mmol/L (ref 135–145)

## 2021-01-16 LAB — LEGIONELLA PNEUMOPHILA SEROGP 1 UR AG: L. pneumophila Serogp 1 Ur Ag: NEGATIVE

## 2021-01-16 LAB — EXPECTORATED SPUTUM ASSESSMENT W GRAM STAIN, RFLX TO RESP C

## 2021-01-16 LAB — GLUCOSE, CAPILLARY
Glucose-Capillary: 137 mg/dL — ABNORMAL HIGH (ref 70–99)
Glucose-Capillary: 166 mg/dL — ABNORMAL HIGH (ref 70–99)
Glucose-Capillary: 212 mg/dL — ABNORMAL HIGH (ref 70–99)

## 2021-01-16 LAB — MRSA NEXT GEN BY PCR, NASAL: MRSA by PCR Next Gen: NOT DETECTED

## 2021-01-16 MED ORDER — ENOXAPARIN SODIUM 40 MG/0.4ML IJ SOSY
40.0000 mg | PREFILLED_SYRINGE | INTRAMUSCULAR | Status: DC
  Administered 2021-01-16 – 2021-01-22 (×7): 40 mg via SUBCUTANEOUS
  Filled 2021-01-16 (×7): qty 0.4

## 2021-01-16 MED ORDER — POLYETHYLENE GLYCOL 3350 17 G PO PACK
17.0000 g | PACK | Freq: Every day | ORAL | Status: DC
  Administered 2021-01-16 – 2021-01-22 (×5): 17 g via ORAL
  Filled 2021-01-16 (×8): qty 1

## 2021-01-16 NOTE — Progress Notes (Signed)
Physical Therapy Treatment Patient Details Name: Brandi Hoover MRN: [id number] DOB: 01-Nov-1940 Today's Date: 01/16/2021   History of Present Illness Brandi Hoover is an 72yoF who comes to [facility] on 01/06/21 c weakness, SOB, wheezing. PMH: asthma, DM2, HTN, obesity. Pt admitted with ARF c hypoxia. At baseline pt lives alone with 24/5 supervision/asssist from DTR, household AMB c 4WW, no falls in past 6 months.    PT Comments    Pt was long sitting in bed with daughter at bedside. She is on 6 L HFNC throughout session. Does drop to 84% a few times however quickly recovers to >88%. PT does have anxiety that greatly effects session progression. Overall pt is progressing but slowly. She is severely deconditioned and will require extensive PT going forward. HEP issued to promote increased activity throughout the day. RN aware of pt's abilities and concerns. Continued recommendation for rehab at DC.  Recommendations for follow up therapy are one component of a multi-disciplinary discharge planning process, led by the attending physician.  Recommendations may be updated based on patient status, additional functional criteria and insurance authorization.  Follow Up Recommendations  Skilled nursing-short term rehab (<3 hours/day)     Assistance Recommended at Discharge Frequent or constant Supervision/Assistance  Equipment Recommendations  Other (comment) (defer to next level of care)       Precautions / Restrictions Precautions Precautions: Fall Precaution Comments: monitor O2 sats, HR, RR Restrictions Weight Bearing Restrictions: No     Mobility  Bed Mobility Overal bed mobility: Needs Assistance Bed Mobility: Supine to Sit     Supine to sit: Mod assist;HOB elevated     General bed mobility comments: Pt continues to requyire extensive assistance and increased time to safely progress to EOB from long sitting    Transfers Overall transfer level: Needs assistance Equipment used: Rolling  walker (2 wheels) Transfers: Sit to/from Stand Sit to Stand: Min assist;Mod assist;From elevated surface           General transfer comment: Min assist to stand from elevated surface with pt using "rocking method." Does require more assistance from lower recliner height surface.    Ambulation/Gait Ambulation/Gait assistance: Min assist Gait Distance (Feet): 3 Feet Assistive device: Rolling walker (2 wheels) Gait Pattern/deviations: Step-to pattern Gait velocity: decrease     General Gait Details: Pt was able to ambulate from EOB to recliner with slow step to gait pattern. pt is very anxious in standing with RR elevated with minimal activity. she was on 6L HFNC throughout with sao2 around 88-90 %. pt is severely deconditioned overall.       Balance Overall balance assessment: Needs assistance Sitting-balance support: Feet supported Sitting balance-Leahy Scale: Good     Standing balance support: Bilateral upper extremity supported;During functional activity;Reliant on assistive device for balance Standing balance-Leahy Scale: Fair      Cognition Arousal/Alertness: Awake/alert Behavior During Therapy: WFL for tasks assessed/performed Overall Cognitive Status: Within Functional Limits for tasks assessed      General Comments: A&O x4           General Comments General comments (skin integrity, edema, etc.): Chief Strategy Officer issued HEP handout for seated exercises and pt performed with Vcs only      Pertinent Vitals/Pain Pain Assessment: No/denies pain     PT Goals (current goals can now be found in the care plan section) Acute Rehab PT Goals Patient Stated Goal: breath better Progress towards PT goals: Progressing toward goals    Frequency    Min 2X/week  PT Plan Current plan remains appropriate       AM-PAC PT "6 Clicks" Mobility   Outcome Measure  Help needed turning from your back to your side while in a flat bed without using bedrails?: A Little Help  needed moving from lying on your back to sitting on the side of a flat bed without using bedrails?: A Lot Help needed moving to and from a bed to a chair (including a wheelchair)?: A Lot Help needed standing up from a chair using your arms (e.g., wheelchair or bedside chair)?: A Lot Help needed to walk in hospital room?: A Little Help needed climbing 3-5 steps with a railing? : A Lot 6 Click Score: 14    End of Session Equipment Utilized During Treatment: Oxygen Activity Tolerance: Patient limited by fatigue Patient left: in chair;with call bell/phone within reach;with chair alarm set;with nursing/sitter in room Nurse Communication: Mobility status PT Visit Diagnosis: Difficulty in walking, not elsewhere classified (R26.2);Other abnormalities of gait and mobility (R26.89);Muscle weakness (generalized) (M62.81)     Time: 1025-1050 PT Time Calculation (min) (ACUTE ONLY): 25 min  Charges:  $Therapeutic Exercise: 8-22 mins $Therapeutic Activity: 8-22 mins                     Julaine Fusi PTA 01/16/21, 11:05 AM

## 2021-01-16 NOTE — Progress Notes (Signed)
PROGRESS NOTE  AVON MOLOCK BOF:[id number] DOB: 22-Aug-1940 DOA: 01/06/2021 PCP: Harrel Lemon, MD  Brief History   Brandi Hoover is a 80 y.o. female with hx of asthma/COPD, diabetes mellitus type 2, hypertension, obesity who presented with complaints of shortness of breath and wheezing for the past few days.  Patient stated that her daughter has been ill with some respiratory illness.    Consultants  None  Procedures  None  Antibiotics   Anti-infectives (From admission, onward)    Start     Dose/Rate Route Frequency Ordered Stop   01/15/21 2200  doxycycline (VIBRA-TABS) tablet 100 mg        100 mg Oral Every 12 hours 01/15/21 1502 01/22/21 2159   01/15/21 1600  cefTRIAXone (ROCEPHIN) 1 g in sodium chloride 0.9 % 100 mL IVPB        1 g 200 mL/hr over 30 Minutes Intravenous Every 24 hours 01/15/21 1502 01/22/21 1559      Subjective  The patient is resting comfortably. She is feeling improved. Less sob, speaking in full sentences, tolerating diet  Objective   Vitals:  Vitals:   01/16/21 0843 01/16/21 1213  BP:  140/60  Pulse:  78  Resp:  20  Temp:  98.2 F (36.8 C)  SpO2: 93% 92%    Exam:  Constitutional:  The patient is awake, alert, and oriented x 3. No acute distress. Respiratory:  No increased work of breathing. Rales at bases, few scattered rhonchi No wheezes or rales Cardiovascular:  Regular rate and rhythm No murmurs, ectopy, or gallups. Abdomen:  Abdomen is soft, non-tender, non-distended No hernias, masses, or organomegaly Normoactive bowel sounds.  Musculoskeletal:  No cyanosis, clubbing, or edema Skin:  No rashes, lesions, ulcers palpation of skin: no induration or nodules Neurologic:  Moving all 4 extremiteis Psychiatric:  Mental status Mood, affect appropriate Orientation to person, place, time  judgment and insight appear intact  I have personally reviewed the following:   Today's Data  Vitals  Lab Data  Glucoses  Micro  Data  Viral respiratory panel positive for metapneumovirus  Imaging  CXR  Cardiology Data  EKG Echocardiogram  Scheduled Meds:  amLODipine  10 mg Oral Daily   ascorbic acid  250 mg Oral Daily   doxycycline  100 mg Oral Q12H   enoxaparin (LOVENOX) injection  40 mg Subcutaneous Q24H   fluticasone-salmeterol  1 puff Inhalation BID   guaiFENesin  1,200 mg Oral BID   insulin aspart  0-15 Units Subcutaneous TID WC   insulin aspart  0-5 Units Subcutaneous QHS   insulin aspart  3 Units Subcutaneous TID WC   insulin glargine-yfgn  25 Units Subcutaneous Daily   ipratropium-albuterol  3 mL Nebulization Q6H   mouth rinse  15 mL Mouth Rinse BID   multivitamin with minerals  1 tablet Oral Daily   predniSONE  40 mg Oral Q breakfast   Ensure Max Protein  11 oz Oral QHS   simvastatin  20 mg Oral QHS    Principal Problem:   Respiratory failure with hypoxia (HCC) Active Problems:   COPD (chronic obstructive pulmonary disease) (HCC)   Anemia   Hypertension   Multinodular goiter   Stage 3b chronic kidney disease (HCC)   Type 2 diabetes mellitus with stage 3b chronic kidney disease, without long-term current use of insulin (HCC)   Acute respiratory failure with hypoxia (HCC)   LOS: 9 days    A & P  Acute hypoxemic respiratory failure 2/2 Metapneumovirus  PNA --The patient has required up to 10 L O2 by high-flow [place] to maintain O2 sats in low 90's. Yesterday 8 L, today 5. Symptomatically much improved. Dimer wnl for age. Procal is low but ct shows possible superimposed pneumonia. Atelectasis likely also contributing.  PLAN:  --Continue supplemental O2 to keep sats between 88-92%, wean as tolerated. - RT following, continuing flutter valve, IS. Has tried adding metanebs - copd/cap as below as below   Asthma/COPD exacerbation Community acquired pneumonia --Pt had both dx in medical records.  --presented with significant dyspnea, rhonchi and wheezes, increased sputum production, and  hypoxemia.  Exacerbation triggered by viral PNA, possible superimposed bacterial. Strep urine antigen neg. Hasn't been able to produce sputum for cultureStrep urine antigen neg. Hasn't been able to produce sputum for culture Plan: --cont prednisone 40 mg daily --schedule ipratropium and Xopenex nebs q4h --cont home Advair as Dulera - urine antigens - mrsa screen - started  ceftriaxone/doxy (macrolide allergy) on 12/8   HTN --cont amlodipine - holding benazepril given kidney function, will hold hctz today as well   CKD 3A --baseline gfr low 30s, has been upper 20s here. Po is good. - hold acei as above, today holding hctz - monitor   DM2 --pt takes metformin, glipizide and JANUVIA at home. --hold home agents --ACHS and SSI for now - cont semglee 25 daily  Morbid Obesity:  BMI 42.03. Complicates all cares. Recommend sensible weight loss through diet modification and increased activity as guided by the patient's PCP as outpatient.  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 I have spent 35 minutes in her evaluation and care.   DVT prophylaxis: Lovenox SQ Code Status: Full code pt confirmed she would want intubation. Family Communication: daughter updated telephonically 12/9   Level of care: Telemetry Medical Dispo:   The patient is from: home Anticipated d/c is to: SNF Anticipated d/c date is: hopeful for 2-3 days Patient currently is not medically ready to d/c due to: severity of illness  [name], MD [facility] Direct contact: see [email or web address]  7PM-7AM contact night coverage as above

## 2021-01-16 NOTE — Progress Notes (Signed)
PHARMACIST - PHYSICIAN COMMUNICATION  CONCERNING:  Enoxaparin (Lovenox) for DVT Prophylaxis    RECOMMENDATION: Patient was prescribed enoxaprin 0.5 mg/kg q24 hours for VTE prophylaxis.   Filed Weights   01/06/21 1205  Weight: 119.9 kg (264 lb 5.3 oz)    Body mass index is 42.03 kg/m.  Estimated Creatinine Clearance: 29 mL/min (A) (by C-G formula based on SCr of 1.99 mg/dL (H)).   Based on [place] patient is candidate for enoxaparin 40mg  SQ every 24 hours based on BMI being >30 and CrCl <67ml/min  DESCRIPTION: Pharmacy has adjusted enoxaparin dose per [facility] policy.  Patient is now receiving enoxaparin 40 mg every 24 hours    Sherilyn Banker, PharmD Clinical Pharmacist  01/16/2021 7:31 AM

## 2021-01-17 ENCOUNTER — Inpatient Hospital Stay: Payer: Medicare Other

## 2021-01-17 LAB — GLUCOSE, CAPILLARY
Glucose-Capillary: 100 mg/dL — ABNORMAL HIGH (ref 70–99)
Glucose-Capillary: 401 mg/dL — ABNORMAL HIGH (ref 70–99)
Glucose-Capillary: 409 mg/dL — ABNORMAL HIGH (ref 70–99)
Glucose-Capillary: 292 mg/dL — ABNORMAL HIGH (ref 70–99)
Glucose-Capillary: 112 mg/dL — ABNORMAL HIGH (ref 70–99)

## 2021-01-17 LAB — BASIC METABOLIC PANEL
Anion gap: 9 (ref 5–15)
BUN: 53 mg/dL — ABNORMAL HIGH (ref 8–23)
CO2: 30 mmol/L (ref 22–32)
Calcium: 8.1 mg/dL — ABNORMAL LOW (ref 8.9–10.3)
Chloride: 103 mmol/L (ref 98–111)
Creatinine, Ser: 1.85 mg/dL — ABNORMAL HIGH (ref 0.44–1.00)
GFR, Estimated: 27 mL/min — ABNORMAL LOW (ref >60)
Glucose, Bld: 91 mg/dL (ref 70–99)
Potassium: 3.9 mmol/L (ref 3.5–5.1)
Sodium: 142 mmol/L (ref 135–145)

## 2021-01-17 LAB — PROCALCITONIN: Procalcitonin: 0.24 ng/mL

## 2021-01-17 MED ORDER — INSULIN ASPART 100 UNIT/ML IJ SOLN
15.0000 [IU] | Freq: Once | INTRAMUSCULAR | Status: AC
  Administered 2021-01-17: 15 [IU] via SUBCUTANEOUS
  Filled 2021-01-17: qty 1

## 2021-01-17 MED ORDER — SODIUM CHLORIDE 0.9 % IV SOLN: INTRAVENOUS | Status: DC | PRN

## 2021-01-17 MED ORDER — INSULIN GLARGINE-YFGN 100 UNIT/ML ~~LOC~~ SOLN
22.0000 [IU] | Freq: Every day | SUBCUTANEOUS | Status: DC
  Administered 2021-01-18 – 2021-01-19 (×2): 22 [IU] via SUBCUTANEOUS
  Filled 2021-01-17 (×2): qty 0.22

## 2021-01-17 NOTE — Progress Notes (Signed)
PROGRESS NOTE  Brandi Hoover OJJ:[id number] DOB: 1940-07-31 DOA: 01/06/2021 PCP: Harrel Lemon, MD  Brief History   Brandi Hoover is a 80 y.o. female with hx of asthma/COPD, diabetes mellitus type 2, hypertension, obesity who presented with complaints of shortness of breath and wheezing for the past few days.  Patient stated that her daughter has been ill with some respiratory illness.    Consultants  None  Procedures  None  Antibiotics   Anti-infectives (From admission, onward)    Start     Dose/Rate Route Frequency Ordered Stop   01/15/21 2200  doxycycline (VIBRA-TABS) tablet 100 mg        100 mg Oral Every 12 hours 01/15/21 1502 01/22/21 2159   01/15/21 1600  cefTRIAXone (ROCEPHIN) 1 g in sodium chloride 0.9 % 100 mL IVPB        1 g 200 mL/hr over 30 Minutes Intravenous Every 24 hours 01/15/21 1502 01/22/21 1559      Subjective  The patient is resting comfortably. She is feeling improved. Less sob, speaking in full sentences, tolerating diet  Objective   Vitals:  Vitals:   01/17/21 0821 01/17/21 1323  BP: (!) 144/67 126/64  Pulse: 79 82  Resp: 16 16  Temp: 98.2 F (36.8 C) 97.9 F (36.6 C)  SpO2: 99% 92%    Exam:  Constitutional:  The patient is awake, alert, and oriented x 3. No acute distress. Respiratory:  No increased work of breathing. Rales at bases, few scattered rhonchi No wheezes or rales Cardiovascular:  Regular rate and rhythm No murmurs, ectopy, or gallups. Abdomen:  Abdomen is soft, non-tender, non-distended No hernias, masses, or organomegaly Normoactive bowel sounds.  Musculoskeletal:  No cyanosis, clubbing, or edema Skin:  No rashes, lesions, ulcers palpation of skin: no induration or nodules Neurologic:  Moving all 4 extremiteis Psychiatric:  Mental status Mood, affect appropriate Orientation to person, place, time  judgment and insight appear intact  I have personally reviewed the following:   Today's Data   Vitals  Lab Data  Glucoses  Micro Data  Viral respiratory panel positive for metapneumovirus  Imaging  CXR  Cardiology Data  EKG Echocardiogram  Scheduled Meds:  amLODipine  10 mg Oral Daily   ascorbic acid  250 mg Oral Daily   doxycycline  100 mg Oral Q12H   enoxaparin (LOVENOX) injection  40 mg Subcutaneous Q24H   fluticasone-salmeterol  1 puff Inhalation BID   guaiFENesin  1,200 mg Oral BID   insulin aspart  0-15 Units Subcutaneous TID WC   insulin aspart  0-5 Units Subcutaneous QHS   insulin aspart  3 Units Subcutaneous TID WC   insulin glargine-yfgn  25 Units Subcutaneous Daily   ipratropium-albuterol  3 mL Nebulization Q6H   mouth rinse  15 mL Mouth Rinse BID   multivitamin with minerals  1 tablet Oral Daily   polyethylene glycol  17 g Oral Daily   predniSONE  40 mg Oral Q breakfast   Ensure Max Protein  11 oz Oral QHS   simvastatin  20 mg Oral QHS    Principal Problem:   Respiratory failure with hypoxia (HCC) Active Problems:   COPD (chronic obstructive pulmonary disease) (HCC)   Anemia   Hypertension   Multinodular goiter   Stage 3b chronic kidney disease (HCC)   Type 2 diabetes mellitus with stage 3b chronic kidney disease, without long-term current use of insulin (HCC)   Acute respiratory failure with hypoxia (HCC)   LOS: 10 days  A & P  Acute hypoxemic respiratory failure 2/2 Metapneumovirus PNA --The patient has required up to 10 L O2 by high-flow [place] to maintain O2 sats in low 90's. Unfortunately o2 requirement has plateaued requiring about 6 L to maintain o2 around 90. Symptomatically much improved. Dimer wnl for age. Procal is low but ct shows possible superimposed pneumonia. Atelectasis likely also contributing.  PLAN:  --Continue supplemental O2 to keep sats between 88-92%, wean as tolerated. - RT following, continuing flutter valve, IS. Has tried adding metanebs - copd/cap as below as below - will check lower extremity doppler to eval for  dvt (no cta given kidney function; may need vq scan though given copd unsure how reliable that would be) - pulmonary consult today given inability to further wean o2   Asthma/COPD exacerbation Community acquired pneumonia --Pt had both dx in medical records.  --presented with significant dyspnea, rhonchi and wheezes, increased sputum production, and hypoxemia.  Exacerbation triggered by viral PNA, possible superimposed bacterial. Mrsa screen neg. Strep and legionella antigens neg Strep urine antigen neg.  Plan: --cont prednisone 40 mg daily --schedule ipratropium and Xopenex nebs q4h --cont home Advair as Dulera - started ceftriaxone/doxy (macrolide allergy) on 12/8   HTN Bp wnl --cont amlodipine - holding benazepril given kidney function, will hold hctz today as well   CKD 3A --baseline gfr low 30s, has been upper 20s here. Po is good. - hold acei and hctz as above - monitor   DM2 --pt takes metformin, glipizide and JANUVIA at home. --hold home agents --ACHS and SSI for now - cont semglee decreasing dose today  Morbid Obesity:  BMI 42.03. Complicates all cares. Recommend sensible weight loss through diet modification and increased activity as guided by the patient's PCP as outpatient.  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 I have spent 35 minutes in her evaluation and care.   DVT prophylaxis: Lovenox SQ Code Status: Full code pt confirmed she would want intubation. Family Communication: daughter updated @ bedside 12/10   Level of care: Telemetry Medical Dispo:   The patient is from: home Anticipated d/c is to: SNF Anticipated d/c date is: hopeful for 2-3 days Patient currently is not medically ready to d/c due to: severity of illness  [name], MD [facility] Direct contact: see [email or web address]  7PM-7AM contact night coverage as above

## 2021-01-17 NOTE — Consult Note (Signed)
PULMONOLOGY         Date: 01/17/2021,   MRN# [id number] Brandi Hoover 09/30/1940     AdmissionWeight: 119.9 kg                 CurrentWeight: 119.9 kg   Referring physician: Dr. Si Raider   CHIEF COMPLAINT:   Persistent respiratory failure with hypoxemia    HISTORY OF PRESENT ILLNESS   80 yo F with hx of PMH of Asthma, DM, metabolic syndrome came in 11 days ago with flu like illness after exposure from family with similar symptoms. Patient is generally independent and denied severe symptoms or cardiac or GI or Neuro symptoms. ROS reveals RLE swelling in recent week prior to admission. She was admitted with COPD exacerbation and treated with doxycycline as well as solumedrol. She is noted to have renal impairment on admission of unknown chronicity and anemia. Renal function has improved when reviewing trend. WBC count has slightly treanded up over past 10d while on steroids and anemia is stable around 10.9. Ddimer has also treanded down quite a bit. ABG done 3d on 50%Fio2 is with severe hypoxia and elevated Aa gradient.  A DVT ultrasound study was negative bilaterally on 01/17/21. There was a CT chest done 01/14/21 I reviewed in detail. Patient is in no distress during my evaluation but in on HFNC and we reviewed medical plan together.   PAST MEDICAL HISTORY   Past Medical History:  Diagnosis Date   Asthma    Diabetes mellitus without complication ([place])    High cholesterol    Hypertension      SURGICAL HISTORY   Past Surgical History:  Procedure Laterality Date   right hip replacement       FAMILY HISTORY   Family History  Problem Relation Age of Onset   COPD Father    Stroke Mother      SOCIAL HISTORY   Social History   Tobacco Use   Smoking status: Never   Smokeless tobacco: Never  Substance Use Topics   Alcohol use: No   Drug use: No     MEDICATIONS    Home Medication:    Current Medication:  Current Facility-Administered Medications:     acetaminophen (TYLENOL) tablet 650 mg, 650 mg, Oral, Q6H PRN, 650 mg at 01/15/21 0900 **OR** acetaminophen (TYLENOL) suppository 650 mg, 650 mg, Rectal, Q6H PRN, Para Skeans, MD   amLODipine (NORVASC) tablet 10 mg, 10 mg, Oral, Daily, Florina Ou V, MD, 10 mg at 01/17/21 1655   ascorbic acid (VITAMIN C) tablet 250 mg, 250 mg, Oral, Daily, Florina Ou V, MD, 250 mg at 01/17/21 0850   benzonatate (TESSALON) capsule 100 mg, 100 mg, Oral, TID PRN, Sharion Settler, NP, 100 mg at 01/08/21 0532   cefTRIAXone (ROCEPHIN) 1 g in sodium chloride 0.9 % 100 mL IVPB, 1 g, Intravenous, Q24H, Wouk, Ailene Rud, MD, Last Rate: 200 mL/hr at 01/16/21 2126, 1 g at 01/16/21 2126   chlorpheniramine-HYDROcodone (TUSSIONEX) 10-8 MG/5ML suspension 5 mL, 5 mL, Oral, Q12H PRN, Swayze, [name], DO   doxycycline (VIBRA-TABS) tablet 100 mg, 100 mg, Oral, Q12H, Wouk, Ailene Rud, MD, 100 mg at 01/17/21 0851   enoxaparin (LOVENOX) injection 40 mg, 40 mg, Subcutaneous, Q24H, Rauer, Samantha O, RPH, 40 mg at 01/16/21 2128   fluticasone-salmeterol (ADVAIR) 250-50 MCG/ACT inhaler 1 puff, 1 puff, Inhalation, BID, Swayze, [name], DO, 1 puff at 01/17/21 0841   guaiFENesin (MUCINEX) 12 hr tablet 1,200 mg, 1,200 mg, Oral, BID,  Wouk, Ailene Rud, MD, 1,200 mg at 01/17/21 0850   guaiFENesin-dextromethorphan (ROBITUSSIN DM) 100-10 MG/5ML syrup 5 mL, 5 mL, Oral, Q4H PRN, Swayze, [name], DO   hydrALAZINE (APRESOLINE) injection 5 mg, 5 mg, Intravenous, Q4H PRN, Para Skeans, MD, 5 mg at 01/10/21 1758   insulin aspart (novoLOG) injection 0-15 Units, 0-15 Units, Subcutaneous, TID WC, Enzo Bi, MD, 3 Units at 01/16/21 1230   insulin aspart (novoLOG) injection 0-5 Units, 0-5 Units, Subcutaneous, QHS, Enzo Bi, MD, 2 Units at 01/16/21 2251   insulin aspart (novoLOG) injection 3 Units, 3 Units, Subcutaneous, TID WC, Wouk, Ailene Rud, MD, 3 Units at 01/16/21 1756   [START ON 01/18/2021] insulin glargine-yfgn (SEMGLEE) injection 22 Units, 22 Units,  Subcutaneous, Daily, Wouk, Ailene Rud, MD   ipratropium-albuterol (DUONEB) 0.5-2.5 (3) MG/3ML nebulizer solution 3 mL, 3 mL, Nebulization, Q6H, Wouk, Ailene Rud, MD, 3 mL at 01/17/21 1424   MEDLINE mouth rinse, 15 mL, Mouth Rinse, BID, Enzo Bi, MD, 15 mL at 01/17/21 5009   multivitamin with minerals tablet 1 tablet, 1 tablet, Oral, Daily, Enzo Bi, MD, 1 tablet at 01/17/21 0851   ondansetron (ZOFRAN) tablet 4 mg, 4 mg, Oral, Q6H PRN **OR** ondansetron (ZOFRAN) injection 4 mg, 4 mg, Intravenous, Q6H PRN, Posey Pronto, Gretta Cool, MD   polyethylene glycol (MIRALAX / GLYCOLAX) packet 17 g, 17 g, Oral, Daily, Wouk, Ailene Rud, MD, 17 g at 01/16/21 1415   predniSONE (DELTASONE) tablet 40 mg, 40 mg, Oral, Q breakfast, Wouk, Ailene Rud, MD, 40 mg at 01/17/21 0850   protein supplement (ENSURE MAX) liquid, 11 oz, Oral, QHS, Enzo Bi, MD, 11 oz at 01/13/21 2131   simvastatin (ZOCOR) tablet 20 mg, 20 mg, Oral, QHS, Florina Ou V, MD, 20 mg at 01/16/21 2127   sodium chloride (OCEAN) 0.65 % nasal spray 1 spray, 1 spray, Each Nare, PRN, Wouk, Ailene Rud, MD, 1 spray at 01/16/21 1242   traMADol (ULTRAM) tablet 50 mg, 50 mg, Oral, Q6H PRN, Para Skeans, MD    ALLERGIES   Mometasone furo-formoterol fum, Azithromycin, Gabapentin, Omeprazole, Other, and Ramipril     REVIEW OF SYSTEMS    Review of Systems:  Gen:  Denies  fever, sweats, chills weigh loss  HEENT: Denies blurred vision, double vision, ear pain, eye pain, hearing loss, nose bleeds, sore throat Cardiac:  No dizziness, chest pain or heaviness, chest tightness,edema Resp:   Denies cough or sputum porduction, shortness of breath,wheezing, hemoptysis,  Gi: Denies swallowing difficulty, stomach pain, nausea or vomiting, diarrhea, constipation, bowel incontinence Gu:  Denies bladder incontinence, burning urine Ext:   Denies Joint pain, stiffness or swelling Skin: Denies  skin rash, easy bruising or bleeding or hives Endoc:  Denies polyuria,  polydipsia , polyphagia or weight change Psych:   Denies depression, insomnia or hallucinations   Other:  All other systems negative   VS: BP (!) 147/82 (BP Location: Left Arm)   Pulse 81   Temp 98.1 F (36.7 C)   Resp 18   Ht 5' 6.5" (1.689 m)   Wt 119.9 kg   SpO2 93%   BMI 42.03 kg/m      PHYSICAL EXAM    GENERAL:NAD, no fevers, chills, no weakness no fatigue HEAD: Normocephalic, atraumatic.  EYES: Pupils equal, round, reactive to light. Extraocular muscles intact. No scleral icterus.  MOUTH: Moist mucosal membrane. Dentition intact. No abscess noted.  EAR, NOSE, THROAT: Clear without exudates. No external lesions.  NECK: Supple. No thyromegaly. No nodules. No JVD.  PULMONARY: Diffuse coarse rhonchi worse on left CARDIOVASCULAR: S1 and S2. Regular rate and rhythm. No murmurs, rubs, or gallops. No edema. Pedal pulses 2+ bilaterally.  GASTROINTESTINAL: Soft, nontender, nondistended. No masses. Positive bowel sounds. No hepatosplenomegaly.  MUSCULOSKELETAL: No swelling, clubbing, or edema. Range of motion full in all extremities.  NEUROLOGIC: Cranial nerves II through XII are intact. No gross focal neurological deficits. Sensation intact. Reflexes intact.  SKIN: No ulceration, lesions, rashes, or cyanosis. Skin warm and dry. Turgor intact.  PSYCHIATRIC: Mood, affect within normal limits. The patient is awake, alert and oriented x 3. Insight, judgment intact.       IMAGING    DG Chest 2 View  Result Date: 01/06/2021 CLINICAL DATA:  Shortness of breath. EXAM: CHEST - 2 VIEW COMPARISON:  Jul 04, 2019. FINDINGS: Stable cardiomediastinal silhouette. Minimal bibasilar subsegmental atelectasis is noted. Bony thorax is unremarkable. IMPRESSION: Minimal bibasilar subsegmental atelectasis. Electronically Signed   By: Marijo Conception M.D.   On: 01/06/2021 13:26   CT CHEST WO CONTRAST  Result Date: 01/14/2021 CLINICAL DATA:  Worsening hypoxia.  Evaluate for possible fusion. EXAM:  CT CHEST WITHOUT CONTRAST TECHNIQUE: Multidetector CT imaging of the chest was performed following the standard protocol without IV contrast. COMPARISON:  Chest radiograph dated 01/14/2021. FINDINGS: Evaluation of this exam is limited in the absence of intravenous contrast. Cardiovascular: There is no cardiomegaly or pericardial effusion. There is coronary vascular calcification. Moderate atherosclerotic calcification of the thoracic aorta. No aneurysmal dilatation. The central pulmonary arteries are grossly unremarkable. Mediastinum/Nodes: No hilar or mediastinal adenopathy. The esophagus is grossly unremarkable. There is a 2.8 cm exophytic right posterior thyroid nodule. In the setting of significant comorbidities or limited life expectancy, no follow-up recommended (ref: J Am Coll Radiol. 2015 Feb;12(2): 143-50).No mediastinal fluid collection. Lungs/Pleura: There is an area of consolidation with air bronchogram involving the left lung base and a smaller area at the right lung base which may represent atelectasis or infiltrate. There is diffuse nodular densities involving the lower lobes consistent with pneumonia. No pleural effusion or pneumothorax. The central airways are patent. Upper Abdomen: Cholecystectomy. Probable cirrhosis. There is a 1 cm left adrenal adenoma. A 4.5 cm exophytic hypodense lesion from the upper pole of the left kidney, likely a cyst. Musculoskeletal: Osteopenia with degenerative changes of the spine. Anterior bridging osteophyte consistent with dish. No acute osseous pathology. IMPRESSION: 1. Bilateral lower lobe pneumonia. 2. Left adrenal adenoma. 3. Aortic Atherosclerosis (ICD10-I70.0). Electronically Signed   By: Anner Crete M.D.   On: 01/14/2021 19:56   US Venous Img Lower Bilateral (DVT)  Result Date: 01/17/2021 CLINICAL DATA:  Hypoxemia EXAM: BILATERAL LOWER EXTREMITY VENOUS DOPPLER ULTRASOUND TECHNIQUE: Gray-scale sonography with graded compression, as well as color  Doppler and duplex ultrasound were performed to evaluate the lower extremity deep venous systems from the level of the common femoral vein and including the common femoral, femoral, profunda femoral, popliteal and calf veins including the posterior tibial, peroneal and gastrocnemius veins when visible. The superficial great saphenous vein was also interrogated. Spectral Doppler was utilized to evaluate flow at rest and with distal augmentation maneuvers in the common femoral, femoral and popliteal veins. COMPARISON:  CT chest, 01/14/2021. FINDINGS: RIGHT LOWER EXTREMITY VENOUS Normal compressibility of the RIGHT common femoral, superficial femoral, and popliteal veins, as well as the visualized calf veins. Visualized portions of profunda femoral vein and great saphenous vein unremarkable. No filling defects to suggest DVT on grayscale or color Doppler imaging. Doppler waveforms show normal direction  of venous flow, normal respiratory plasticity and response to augmentation. OTHER No evidence of superficial thrombophlebitis or abnormal fluid collection. Limitations: none LEFT LOWER EXTREMITY VENOUS Normal compressibility of the LEFT common femoral, superficial femoral, and popliteal veins, as well as the visualized calf veins. Visualized portions of profunda femoral vein and great saphenous vein unremarkable. No filling defects to suggest DVT on grayscale or color Doppler imaging. Doppler waveforms show normal direction of venous flow, normal respiratory plasticity and response to augmentation. OTHER No evidence of superficial thrombophlebitis or abnormal fluid collection. Limitations: none IMPRESSION: No evidence of femoropopliteal DVT within either lower extremity. Michaelle Birks, MD [facility] Electronically Signed   By: Michaelle Birks M.D.   On: 01/17/2021 16:37   DG Chest Port 1 View  Result Date: 01/14/2021 CLINICAL DATA:  Hypoxemia EXAM: PORTABLE CHEST  1 VIEW COMPARISON:  01/09/2021, 01/06/2021, 07/04/2019 FINDINGS: Suspicion of small bilateral pleural effusions. Streaky basilar opacities. Stable cardiomediastinal silhouette. No pneumothorax. IMPRESSION: Probable small pleural effusions. Streaky basilar opacities may reflect atelectasis versus mild pneumonia Electronically Signed   By: Donavan Foil M.D.   On: 01/14/2021 15:30   DG Chest Port 1 View  Result Date: 01/09/2021 CLINICAL DATA:  Dyspnea. EXAM: PORTABLE CHEST 1 VIEW COMPARISON:  January 06, 2021. FINDINGS: The heart size and mediastinal contours are within normal limits. Both lungs are clear. The visualized skeletal structures are unremarkable. IMPRESSION: No active disease. Electronically Signed   By: Marijo Conception M.D.   On: 01/09/2021 16:24   ECHOCARDIOGRAM COMPLETE  Result Date: 01/07/2021    ECHOCARDIOGRAM REPORT   Patient Name:   CHALONDA SCHLATTER Date of Exam: 01/07/2021 Medical Rec #:  [id number]     Height:       66.5 in Accession #:    [id number]    Weight:       264.3 lb Date of Birth:  1940-10-09     BSA:          2.264 m Patient Age:    73 years      BP:           146/74 mmHg Patient Gender: F             HR:           115 bpm. Exam Location:  [facility] Procedure: 2D Echo, Color Doppler and Cardiac Doppler Indications:     I50.31 congestive heart failure-Acute Diastolic  History:         Patient has no prior history of Echocardiogram examinations.                  Risk Factors:Hypertension, Diabetes and HCL.  Sonographer:     Charmayne Sheer Referring Phys:  [id number] Gretta Cool PATEL Diagnosing Phys: Kate Sable MD  Sonographer Comments: Suboptimal parasternal window and suboptimal subcostal window. Image acquisition challenging due to patient body habitus. IMPRESSIONS  1. Left ventricular ejection fraction, by estimation, is 55%. The left ventricle has normal function. Left ventricular endocardial border not optimally defined to evaluate regional wall motion. Left ventricular diastolic  parameters are indeterminate.  2. Right ventricular systolic function is normal. The right ventricular size is normal.  3. The mitral valve is normal in structure. No evidence of mitral valve regurgitation.  4. The aortic valve was not well visualized. Aortic valve regurgitation is not visualized.  5. The inferior vena cava is normal in size with greater than 50% respiratory variability, suggesting right atrial pressure of  3 mmHg. FINDINGS  Left Ventricle: Left ventricular ejection fraction, by estimation, is 55%. The left ventricle has normal function. Left ventricular endocardial border not optimally defined to evaluate regional wall motion. The global longitudinal strain is normal despite suboptimal segment tracking. The left ventricular internal cavity size was normal in size. There is no left ventricular hypertrophy. Left ventricular diastolic parameters are indeterminate. Right Ventricle: The right ventricular size is normal. No increase in right ventricular wall thickness. Right ventricular systolic function is normal. Left Atrium: Left atrial size was normal in size. Right Atrium: Right atrial size was not well visualized. Pericardium: There is no evidence of pericardial effusion. Mitral Valve: The mitral valve is normal in structure. No evidence of mitral valve regurgitation. MV peak gradient, 13.8 mmHg. The mean mitral valve gradient is 7.0 mmHg. Tricuspid Valve: The tricuspid valve is not well visualized. Tricuspid valve regurgitation is not demonstrated. Aortic Valve: The aortic valve was not well visualized. Aortic valve regurgitation is not visualized. Aortic valve mean gradient measures 9.0 mmHg. Aortic valve peak gradient measures 15.7 mmHg. Aortic valve area, by VTI measures 2.39 cm. Pulmonic Valve: The pulmonic valve was normal in structure. Pulmonic valve regurgitation is not visualized. Aorta: The aortic root is normal in size and structure. Venous: The inferior vena cava is normal in size with  greater than 50% respiratory variability, suggesting right atrial pressure of 3 mmHg. IAS/Shunts: No atrial level shunt detected by color flow Doppler.  LEFT VENTRICLE PLAX 2D LVOT diam:     2.10 cm      Diastology LV SV:         93           LV e' medial:    13.20 cm/s LV SV Index:   41           LV E/e' medial:  12.4 LVOT Area:     3.46 cm     LV e' lateral:   7.40 cm/s                             LV E/e' lateral: 22.2  LV Volumes (MOD) LV vol d, MOD A2C: 105.0 ml LV vol d, MOD A4C: 122.0 ml LV vol s, MOD A2C: 59.9 ml LV vol s, MOD A4C: 64.8 ml LV SV MOD A2C:     45.1 ml LV SV MOD A4C:     122.0 ml LV SV MOD BP:      62.1 ml LEFT ATRIUM           Index LA Vol (A4C): 72.4 ml 31.98 ml/m  AORTIC VALVE                     PULMONIC VALVE AV Area (Vmax):    2.48 cm      PV Vmax:       1.10 m/s AV Area (Vmean):   2.36 cm      PV Vmean:      80.500 cm/s AV Area (VTI):     2.39 cm      PV VTI:        0.229 m AV Vmax:           198.00 cm/s   PV Peak grad:  4.8 mmHg AV Vmean:          142.000 cm/s  PV Mean grad:  3.0 mmHg AV VTI:  0.390 m AV Peak Grad:      15.7 mmHg AV Mean Grad:      9.0 mmHg LVOT Vmax:         142.00 cm/s LVOT Vmean:        96.700 cm/s LVOT VTI:          0.269 m LVOT/AV VTI ratio: 0.69  AORTA Ao Root diam: 2.70 cm MITRAL VALVE MV Area (PHT): 6.27 cm     SHUNTS MV Area VTI:   3.28 cm     Systemic VTI:  0.27 m MV Peak grad:  13.8 mmHg    Systemic Diam: 2.10 cm MV Mean grad:  7.0 mmHg MV Vmax:       1.86 m/s MV Vmean:      123.0 cm/s MV Decel Time: 121 msec MV E velocity: 164.20 cm/s MV A velocity: 142.00 cm/s MV E/A ratio:  1.16 Kate Sable MD Electronically signed by Kate Sable MD Signature Date/Time: 01/07/2021/5:24:31 PM    Final       CLINICAL DATA:  Worsening hypoxia.  Evaluate for possible fusion.   EXAM: CT CHEST WITHOUT CONTRAST   TECHNIQUE: Multidetector CT imaging of the chest was performed following the standard protocol without IV contrast.    COMPARISON:  Chest radiograph dated 01/14/2021.   FINDINGS: Evaluation of this exam is limited in the absence of intravenous contrast.   Cardiovascular: There is no cardiomegaly or pericardial effusion. There is coronary vascular calcification. Moderate atherosclerotic calcification of the thoracic aorta. No aneurysmal dilatation. The central pulmonary arteries are grossly unremarkable.   Mediastinum/Nodes: No hilar or mediastinal adenopathy. The esophagus is grossly unremarkable. There is a 2.8 cm exophytic right posterior thyroid nodule. In the setting of significant comorbidities or limited life expectancy, no follow-up recommended (ref: J Am Coll Radiol. 2015 Feb;12(2): 143-50).No mediastinal fluid collection.   Lungs/Pleura: There is an area of consolidation with air bronchogram involving the left lung base and a smaller area at the right lung base which may represent atelectasis or infiltrate. There is diffuse nodular densities involving the lower lobes consistent with pneumonia. No pleural effusion or pneumothorax. The central airways are patent.   Upper Abdomen: Cholecystectomy. Probable cirrhosis. There is a 1 cm left adrenal adenoma. A 4.5 cm exophytic hypodense lesion from the upper pole of the left kidney, likely a cyst.   Musculoskeletal: Osteopenia with degenerative changes of the spine. Anterior bridging osteophyte consistent with dish. No acute osseous pathology.   IMPRESSION: 1. Bilateral lower lobe pneumonia. 2. Left adrenal adenoma. 3. Aortic Atherosclerosis (ICD10-I70.0).     Electronically Signed   By: Anner Crete M.D.   On: 01/14/2021 19:56  ASSESSMENT/PLAN   Acute hypoxemic respiratory failure Present on admission due to viral pneumonia secondary to metaneumovirus -patient may have developed bacterial pneumonia superimposed on viral and we will obtain Procalcitonin despite renal impairment to follow trend. There is MRSA panel negative from  12/8.  Patient has been on anibiotics empirically.  -There is consolidated infiltrate bilaterally worse on left as evidenced above.  -patient should be encouraged to cough and expectorate debris to allow lung recruitment. Flutter valve would help with this.  -there is not much fluid in lungs or around lungs which is helpful because we dont have to diurese with CKD. -She may benefit from more aggressive bronchopulmoary hygiene -will stop doxycycline due to elevated risk of pill induced esophagitis and unlilkely helping much with MRSA negative and having rocephin IV going still.  -Will dc tessalon and  tussinex since its likely hindering cough reflex and in her case coughing would help    Moderate persistent asthma with acute exacerbation  - Due to viral pneumonia with metapneumovirus -patient has been on solumedrol, agree with transition to prednisone, currently on 40mg  -patient is with high phlegm and infiltrates, will advance regiment with Mucomyst 67ml BID 20% -continue Duoneb as ordered    Bibasilar atelectasis     - patient with incentive spirometry      - encourage to use flutter valve      - encourage participation with PT patient was able to stand take few shuffling steps from bed to chair only.     - will advance BPH to metaneb BID with RT x2d  Thank you for allowing me to participate in the care of this patient.  Total face to face encounter time for this patient visit was >45 min. >50% of the time was  spent in counseling and coordination of care.   Patient/Family are satisfied with care plan and all questions have been answered.  This document was prepared using Dragon voice recognition software and may include unintentional dictation errors.     Ottie Glazier, M.D.  Division of [place]

## 2021-01-17 NOTE — Progress Notes (Signed)
Occupational Therapy Treatment Patient Details Name: Brandi Hoover MRN: [id number] DOB: 11-Apr-1940 Today's Date: 01/17/2021   History of present illness Brandi Hoover is an 79yoF who comes to [facility] on 01/06/21 c weakness, SOB, wheezing. PMH: asthma, DM2, HTN, obesity. Pt admitted with ARF c hypoxia. At baseline pt lives alone with 24/5 supervision/asssist from DTR, household AMB c 4WW, no falls in past 6 months.   OT comments  Pt seen for OT Tx this date to f/u re: safety with ADLs/ADL mobility. OT engages pt in seated LB dressing and MOD A STS with RW with cues for safety. OT assists pt with weight shift to take small shuffling steps with MOD A with RW from chair to bed. and MOD/MAX A to manage LEs back to bed and reposition. cues for pacing and breathing throughout. Pt in bed with transport prepping to take pt. All needs met and in reach. Will continue to follow.    Recommendations for follow up therapy are one component of a multi-disciplinary discharge planning process, led by the attending physician.  Recommendations may be updated based on patient status, additional functional criteria and insurance authorization.    Follow Up Recommendations  Skilled nursing-short term rehab (<3 hours/day)    Assistance Recommended at Discharge Intermittent Supervision/Assistance  Equipment Recommendations  BSC/3in1    Recommendations for Other Services      Precautions / Restrictions Precautions Precautions: Fall Precaution Comments: monitor O2 sats, HR, RR Restrictions Weight Bearing Restrictions: No       Mobility Bed Mobility Overal bed mobility: Needs Assistance Bed Mobility: Sit to Supine       Sit to supine: Mod assist;Max assist   General bed mobility comments: extensicve assist for LEs back to bed    Transfers Overall transfer level: Needs assistance Equipment used: Rolling walker (2 wheels) Transfers: Sit to/from Stand Sit to Stand: Mod assist           General  transfer comment: MOD A to stand from recliner using arm rests.     Balance Overall balance assessment: Needs assistance Sitting-balance support: Feet supported Sitting balance-Leahy Scale: Good Sitting balance - Comments: G static sitting   Standing balance support: Bilateral upper extremity supported;During functional activity;Reliant on assistive device for balance Standing balance-Leahy Scale: Fair Standing balance comment: UE uspport, F static standing, does demo some increaseed hip and knee flexion with small shuffling steps towards bed                           ADL either performed or assessed with clinical judgement   ADL Overall ADL's : Needs assistance/impaired                     Lower Body Dressing: Maximal assistance;Sitting/lateral leans Lower Body Dressing Details (indicate cue type and reason): shoes             Functional mobility during ADLs: Moderate assistance;Rolling walker (2 wheels);+2 for safety/equipment (MOD A to take ~4-5 small shulffling side steps from recliner to bed with transport personnell managing lines/leads.)      Extremity/Trunk Assessment              Vision       Perception     Praxis      Cognition Arousal/Alertness: Awake/alert Behavior During Therapy: WFL for tasks assessed/performed Overall Cognitive Status: Within Functional Limits for tasks assessed  General Comments: A&O x4          Exercises Other Exercises Other Exercises: OT engages pt in seated LB dressing and MOD A STS wtih RW with cues for safety. OT assists pt with weight shift to take small shuffling steps with MOD A with RW from chair to bed. and MOD/MAX A to manage LEs back to bed and reposition. cues for pacing and breathing throughout.   Shoulder Instructions       General Comments      Pertinent Vitals/ Pain       Pain Assessment: No/denies pain  Home Living                                           Prior Functioning/Environment              Frequency  Min 2X/week        Progress Toward Goals  OT Goals(current goals can now be found in the care plan section)  Progress towards OT goals: Progressing toward goals  Acute Rehab OT Goals Patient Stated Goal: breathe better OT Goal Formulation: With patient/family Time For Goal Achievement: 01/22/21 Potential to Achieve Goals: Good  Plan Discharge plan remains appropriate;Frequency remains appropriate    Co-evaluation                 AM-PAC OT "6 Clicks" Daily Activity     Outcome Measure   Help from another person eating meals?: None Help from another person taking care of personal grooming?: None Help from another person toileting, which includes using toliet, bedpan, or urinal?: A Lot Help from another person bathing (including washing, rinsing, drying)?: A Lot Help from another person to put on and taking off regular upper body clothing?: A Little Help from another person to put on and taking off regular lower body clothing?: A Lot 6 Click Score: 17    End of Session Equipment Utilized During Treatment: Oxygen;Gait belt;Rolling walker (2 wheels)  OT Visit Diagnosis: Other abnormalities of gait and mobility (R26.89);Muscle weakness (generalized) (M62.81)   Activity Tolerance Patient tolerated treatment well   Patient Left in bed;with call bell/phone within reach;with family/visitor present;Other (comment) (transport preppting to take pt)   Nurse Communication Mobility status        Time: 4332-9518 OT Time Calculation (min): 17 min  Charges: OT General Charges $OT Visit: 1 Visit OT Treatments $Therapeutic Activity: 8-22 mins  Gerrianne Scale, MS, OTR/L ascom [phone] 01/17/21, 4:31 PM

## 2021-01-18 LAB — CBC
HCT: 31.1 % — ABNORMAL LOW (ref 36.0–46.0)
Hemoglobin: 9.6 g/dL — ABNORMAL LOW (ref 12.0–15.0)
MCH: 29.1 pg (ref 26.0–34.0)
MCHC: 30.9 g/dL (ref 30.0–36.0)
MCV: 94.2 fL (ref 80.0–100.0)
Platelets: 264 10*3/uL (ref 150–400)
RBC: 3.3 MIL/uL — ABNORMAL LOW (ref 3.87–5.11)
RDW: 13 % (ref 11.5–15.5)
WBC: 13.2 10*3/uL — ABNORMAL HIGH (ref 4.0–10.5)
nRBC: 0 % (ref 0.0–0.2)

## 2021-01-18 LAB — GLUCOSE, CAPILLARY
Glucose-Capillary: 127 mg/dL — ABNORMAL HIGH (ref 70–99)
Glucose-Capillary: 186 mg/dL — ABNORMAL HIGH (ref 70–99)
Glucose-Capillary: 274 mg/dL — ABNORMAL HIGH (ref 70–99)
Glucose-Capillary: 237 mg/dL — ABNORMAL HIGH (ref 70–99)

## 2021-01-18 LAB — BASIC METABOLIC PANEL
Anion gap: 9 (ref 5–15)
BUN: 50 mg/dL — ABNORMAL HIGH (ref 8–23)
CO2: 29 mmol/L (ref 22–32)
Calcium: 8.1 mg/dL — ABNORMAL LOW (ref 8.9–10.3)
Chloride: 101 mmol/L (ref 98–111)
Creatinine, Ser: 1.8 mg/dL — ABNORMAL HIGH (ref 0.44–1.00)
GFR, Estimated: 28 mL/min — ABNORMAL LOW (ref >60)
Glucose, Bld: 134 mg/dL — ABNORMAL HIGH (ref 70–99)
Potassium: 4 mmol/L (ref 3.5–5.1)
Sodium: 139 mmol/L (ref 135–145)

## 2021-01-18 NOTE — TOC Progression Note (Signed)
Transition of Care [facility]) - Progression Note    Patient Details  Name: Brandi Hoover MRN: [id number] Date of Birth: 1940-03-05  Transition of [facility] CM/SW Zillah, LCSW Phone Number: 01/18/2021, 10:28 AM  Clinical Narrative:   TOC continues to follow for DC to [facility] when medically stable.    Expected Discharge Plan:  (Home health vs snf) Barriers to Discharge: Continued Medical Work up  Expected Discharge Plan and Services Expected Discharge Plan:  (Home health vs snf)   Discharge Planning Services: CM Consult   Living arrangements for the past 2 months: Single Family Home                                       Social Determinants of Health (SDOH) Interventions    Readmission Risk Interventions No flowsheet data found.

## 2021-01-18 NOTE — Progress Notes (Signed)
PROGRESS NOTE  Brandi Hoover ZDG:[id number] DOB: 12-27-1940 DOA: 01/06/2021 PCP: Brandi Lemon, MD  Brief History   Brandi Hoover is a 80 y.o. female with hx of asthma/COPD, diabetes mellitus type 2, hypertension, obesity who presented with complaints of shortness of breath and wheezing for the past few days.  Patient stated that her daughter has been ill with some respiratory illness.    Consultants  None  Procedures  None  Antibiotics   Anti-infectives (From admission, onward)    Start     Dose/Rate Route Frequency Ordered Stop   01/15/21 2200  doxycycline (VIBRA-TABS) tablet 100 mg  Status:  Discontinued        100 mg Oral Every 12 hours 01/15/21 1502 01/17/21 1724   01/15/21 1600  cefTRIAXone (ROCEPHIN) 1 g in sodium chloride 0.9 % 100 mL IVPB        1 g 200 mL/hr over 30 Minutes Intravenous Every 24 hours 01/15/21 1502 01/22/21 1559      Subjective  The patient is resting comfortably. She is feeling improved. Less sob, speaking in full sentences, tolerating diet  Objective   Vitals:  Vitals:   01/18/21 1239 01/18/21 1342  BP: (!) 120/51   Pulse: 78   Resp: 16   Temp: 98.2 F (36.8 C)   SpO2: 94% 95%    Exam:  Constitutional:  The patient is awake, alert, and oriented x 3. No acute distress. Respiratory:  No increased work of breathing. Rales at bases, few scattered rhonchi No wheezes or rales Cardiovascular:  Regular rate and rhythm No murmurs, ectopy, or gallups. Abdomen:  Abdomen is soft, non-tender, non-distended No hernias, masses, or organomegaly Normoactive bowel sounds.  Musculoskeletal:  No cyanosis, clubbing, or edema Skin:  No rashes, lesions, ulcers palpation of skin: no induration or nodules Neurologic:  Moving all 4 extremiteis Psychiatric:  Mental status Mood, affect appropriate Orientation to person, place, time  judgment and insight appear intact  I have personally reviewed the following:   Today's Data  Vitals  Lab  Data  Glucoses  Micro Data  Viral respiratory panel positive for metapneumovirus  Imaging  CXR  Cardiology Data  EKG Echocardiogram  Scheduled Meds:  amLODipine  10 mg Oral Daily   ascorbic acid  250 mg Oral Daily   enoxaparin (LOVENOX) injection  40 mg Subcutaneous Q24H   fluticasone-salmeterol  1 puff Inhalation BID   guaiFENesin  1,200 mg Oral BID   insulin aspart  0-15 Units Subcutaneous TID WC   insulin aspart  0-5 Units Subcutaneous QHS   insulin aspart  3 Units Subcutaneous TID WC   insulin glargine-yfgn  22 Units Subcutaneous Daily   ipratropium-albuterol  3 mL Nebulization Q6H   mouth rinse  15 mL Mouth Rinse BID   multivitamin with minerals  1 tablet Oral Daily   polyethylene glycol  17 g Oral Daily   predniSONE  40 mg Oral Q breakfast   Ensure Max Protein  11 oz Oral QHS   simvastatin  20 mg Oral QHS    Principal Problem:   Respiratory failure with hypoxia (HCC) Active Problems:   COPD (chronic obstructive pulmonary disease) (HCC)   Anemia   Hypertension   Multinodular goiter   Stage 3b chronic kidney disease (HCC)   Type 2 diabetes mellitus with stage 3b chronic kidney disease, without long-term current use of insulin (HCC)   Acute respiratory failure with hypoxia (HCC)   LOS: 11 days    A & P  Acute hypoxemic respiratory failure 2/2 Metapneumovirus PNA --The patient has required up to 10 L O2 by high-flow [place] to maintain O2 sats in low 90's. Unfortunately o2 requirement has plateaued requiring about 6 L to maintain o2 around 90. Symptomatically much improved. Dimer wnl for age. Procal is low but ct shows possible superimposed pneumonia. Atelectasis likely also contributing. LE dopplers neg for DVT. PLAN:  --Continue supplemental O2 to keep sats between 88-92%, wean as tolerated. - RT following, continuing flutter valve, IS. Has tried adding metanebs - copd/cap as below as below - pulmonary now following   Asthma/COPD exacerbation Community acquired  pneumonia --Pt had both dx in medical records.  --presented with significant dyspnea, rhonchi and wheezes, increased sputum production, and hypoxemia.  Exacerbation triggered by viral PNA, possible superimposed bacterial. Mrsa screen neg. Strep and legionella antigens neg Strep urine antigen neg.  Plan: --cont prednisone 40 mg daily --schedule ipratropium and Xopenex nebs q4h --cont home Advair as Dulera - started ceftriaxone (macrolide allergy) on 12/8, doxy discontinued by pulm on 12/10 (mrsa screen neg)   HTN Bp wnl --cont amlodipine - holding benazepril and hctz  CKD 3A --baseline gfr low 30s, has been upper 20s here. Po is good. - hold acei and hctz as above - monitor   DM2 --pt takes metformin, glipizide and JANUVIA at home. --hold home agents --ACHS and SSI for now - cont semglee   Morbid Obesity:  BMI 42.03. Complicates all cares. Recommend sensible weight loss through diet modification and increased activity as guided by the patient's PCP as outpatient.  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 I have spent 35 minutes in her evaluation and care.   DVT prophylaxis: Lovenox SQ Code Status: Full code pt confirmed she would want intubation. Family Communication: daughter updated telephonically 12/11   Level of care: Telemetry Medical Dispo:   The patient is from: home Anticipated d/c is to: SNF Anticipated d/c date is: hopeful for 2-3 days Patient currently is not medically ready to d/c due to: severity of illness  Brandi Arrow, MD [facility] Direct contact: see [email or web address]  7PM-7AM contact night coverage as above

## 2021-01-18 NOTE — Progress Notes (Signed)
PULMONOLOGY         Date: 01/18/2021,   MRN# [id number] TARRY BLAYNEY [birth year]     AdmissionWeight: 119.9 kg                 CurrentWeight: 119.9 kg   Referring physician: Dr. Si Raider   CHIEF COMPLAINT:   Persistent respiratory failure with hypoxemia    HISTORY OF PRESENT ILLNESS   80 yo F with hx of PMH of Asthma, DM, metabolic syndrome came in 11 days ago with flu like illness after exposure from family with similar symptoms. Patient is generally independent and denied severe symptoms or cardiac or GI or Neuro symptoms. ROS reveals RLE swelling in recent week prior to admission. She was admitted with COPD exacerbation and treated with doxycycline as well as solumedrol. She is noted to have renal impairment on admission of unknown chronicity and anemia. Renal function has improved when reviewing trend. WBC count has slightly treanded up over past 10d while on steroids and anemia is stable around 10.9. Ddimer has also treanded down quite a bit. ABG done 3d on 50%Fio2 is with severe hypoxia and elevated Aa gradient.  A DVT ultrasound study was negative bilaterally on 01/17/21. There was a CT chest done 01/14/21 I reviewed in detail. Patient is in no distress during my evaluation but in on HFNC and we reviewed medical plan together.   01/18/21- patient is sitting up in chair on 6L/min nasal canula reporting improved respiratory status with less labored breathing. She is using flutter and IS we reviewed technique together. Reviewed medical plan with Dr Si Raider who has kindly discussed with family and answered additional questions.   PAST MEDICAL HISTORY   Past Medical History:  Diagnosis Date   Asthma    Diabetes mellitus without complication ([place])    High cholesterol    Hypertension      SURGICAL HISTORY   Past Surgical History:  Procedure Laterality Date   right hip replacement       FAMILY HISTORY   Family History  Problem Relation Age of Onset   COPD Father     Stroke Mother      SOCIAL HISTORY   Social History   Tobacco Use   Smoking status: Never   Smokeless tobacco: Never  Substance Use Topics   Alcohol use: No   Drug use: No     MEDICATIONS    Home Medication:    Current Medication:  Current Facility-Administered Medications:    0.9 %  sodium chloride infusion, , Intravenous, PRN, Wouk, Ailene Rud, MD, Stopped at 01/17/21 1719   acetaminophen (TYLENOL) tablet 650 mg, 650 mg, Oral, Q6H PRN, 650 mg at 01/15/21 0900 **OR** acetaminophen (TYLENOL) suppository 650 mg, 650 mg, Rectal, Q6H PRN, Para Skeans, MD   amLODipine (NORVASC) tablet 10 mg, 10 mg, Oral, Daily, Florina Ou V, MD, 10 mg at 01/18/21 3536   ascorbic acid (VITAMIN C) tablet 250 mg, 250 mg, Oral, Daily, Florina Ou V, MD, 250 mg at 01/18/21 0941   cefTRIAXone (ROCEPHIN) 1 g in sodium chloride 0.9 % 100 mL IVPB, 1 g, Intravenous, Q24H, Wouk, Ailene Rud, MD, Stopped at 01/18/21 0321   enoxaparin (LOVENOX) injection 40 mg, 40 mg, Subcutaneous, Q24H, Rauer, Forde Dandy, RPH, 40 mg at 01/17/21 2119   fluticasone-salmeterol (ADVAIR) 250-50 MCG/ACT inhaler 1 puff, 1 puff, Inhalation, BID, Swayze, [name], DO, 1 puff at 01/18/21 0939   guaiFENesin (MUCINEX) 12 hr tablet 1,200 mg, 1,200 mg, Oral,  BID, Wouk, Ailene Rud, MD, 1,200 mg at 01/18/21 4536   guaiFENesin-dextromethorphan (ROBITUSSIN DM) 100-10 MG/5ML syrup 5 mL, 5 mL, Oral, Q4H PRN, Swayze, [name], DO   hydrALAZINE (APRESOLINE) injection 5 mg, 5 mg, Intravenous, Q4H PRN, Para Skeans, MD, 5 mg at 01/10/21 1758   insulin aspart (novoLOG) injection 0-15 Units, 0-15 Units, Subcutaneous, TID WC, Enzo Bi, MD, 3 Units at 01/18/21 1351   insulin aspart (novoLOG) injection 0-5 Units, 0-5 Units, Subcutaneous, QHS, Enzo Bi, MD, 2 Units at 01/16/21 2251   insulin aspart (novoLOG) injection 3 Units, 3 Units, Subcutaneous, TID WC, Wouk, Ailene Rud, MD, 3 Units at 01/18/21 1351   insulin glargine-yfgn (SEMGLEE) injection 22  Units, 22 Units, Subcutaneous, Daily, Wouk, Ailene Rud, MD, 22 Units at 01/18/21 0946   ipratropium-albuterol (DUONEB) 0.5-2.5 (3) MG/3ML nebulizer solution 3 mL, 3 mL, Nebulization, Q6H, Wouk, Ailene Rud, MD, 3 mL at 01/18/21 1340   MEDLINE mouth rinse, 15 mL, Mouth Rinse, BID, Enzo Bi, MD, 15 mL at 01/17/21 2119   multivitamin with minerals tablet 1 tablet, 1 tablet, Oral, Daily, Enzo Bi, MD, 1 tablet at 01/18/21 0941   ondansetron (ZOFRAN) tablet 4 mg, 4 mg, Oral, Q6H PRN **OR** ondansetron (ZOFRAN) injection 4 mg, 4 mg, Intravenous, Q6H PRN, Posey Pronto, Gretta Cool, MD   polyethylene glycol (MIRALAX / GLYCOLAX) packet 17 g, 17 g, Oral, Daily, Wouk, Ailene Rud, MD, 17 g at 01/16/21 1415   predniSONE (DELTASONE) tablet 40 mg, 40 mg, Oral, Q breakfast, Wouk, Ailene Rud, MD, 40 mg at 01/18/21 4680   protein supplement (ENSURE MAX) liquid, 11 oz, Oral, QHS, Enzo Bi, MD, 11 oz at 01/17/21 2118   simvastatin (ZOCOR) tablet 20 mg, 20 mg, Oral, QHS, Florina Ou V, MD, 20 mg at 01/17/21 2118   sodium chloride (OCEAN) 0.65 % nasal spray 1 spray, 1 spray, Each Nare, PRN, Wouk, Ailene Rud, MD, 1 spray at 01/16/21 1242   traMADol (ULTRAM) tablet 50 mg, 50 mg, Oral, Q6H PRN, Para Skeans, MD    ALLERGIES   Mometasone furo-formoterol fum, Azithromycin, Gabapentin, Omeprazole, Other, and Ramipril     REVIEW OF SYSTEMS    Review of Systems:  Gen:  Denies  fever, sweats, chills weigh loss  HEENT: Denies blurred vision, double vision, ear pain, eye pain, hearing loss, nose bleeds, sore throat Cardiac:  No dizziness, chest pain or heaviness, chest tightness,edema Resp:   Denies cough or sputum porduction, shortness of breath,wheezing, hemoptysis,  Gi: Denies swallowing difficulty, stomach pain, nausea or vomiting, diarrhea, constipation, bowel incontinence Gu:  Denies bladder incontinence, burning urine Ext:   Denies Joint pain, stiffness or swelling Skin: Denies  skin rash, easy bruising or  bleeding or hives Endoc:  Denies polyuria, polydipsia , polyphagia or weight change Psych:   Denies depression, insomnia or hallucinations   Other:  All other systems negative   VS: BP (!) 120/51 (BP Location: Left Arm)   Pulse 78   Temp 98.2 F (36.8 C) (Oral)   Resp 16   Ht 5' 6.5" (1.689 m)   Wt 119.9 kg   SpO2 95%   BMI 42.03 kg/m      PHYSICAL EXAM    GENERAL:NAD, no fevers, chills, no weakness no fatigue HEAD: Normocephalic, atraumatic.  EYES: Pupils equal, round, reactive to light. Extraocular muscles intact. No scleral icterus.  MOUTH: Moist mucosal membrane. Dentition intact. No abscess noted.  EAR, NOSE, THROAT: Clear without exudates. No external lesions.  NECK: Supple. No thyromegaly. No  nodules. No JVD.  PULMONARY: Diffuse coarse rhonchi worse on left CARDIOVASCULAR: S1 and S2. Regular rate and rhythm. No murmurs, rubs, or gallops. No edema. Pedal pulses 2+ bilaterally.  GASTROINTESTINAL: Soft, nontender, nondistended. No masses. Positive bowel sounds. No hepatosplenomegaly.  MUSCULOSKELETAL: No swelling, clubbing, or edema. Range of motion full in all extremities.  NEUROLOGIC: Cranial nerves II through XII are intact. No gross focal neurological deficits. Sensation intact. Reflexes intact.  SKIN: No ulceration, lesions, rashes, or cyanosis. Skin warm and dry. Turgor intact.  PSYCHIATRIC: Mood, affect within normal limits. The patient is awake, alert and oriented x 3. Insight, judgment intact.       IMAGING    DG Chest 2 View  Result Date: 01/06/2021 CLINICAL DATA:  Shortness of breath. EXAM: CHEST - 2 VIEW COMPARISON:  Jul 04, 2019. FINDINGS: Stable cardiomediastinal silhouette. Minimal bibasilar subsegmental atelectasis is noted. Bony thorax is unremarkable. IMPRESSION: Minimal bibasilar subsegmental atelectasis. Electronically Signed   By: Marijo Conception M.D.   On: 01/06/2021 13:26   CT CHEST WO CONTRAST  Result Date: 01/14/2021 CLINICAL DATA:   Worsening hypoxia.  Evaluate for possible fusion. EXAM: CT CHEST WITHOUT CONTRAST TECHNIQUE: Multidetector CT imaging of the chest was performed following the standard protocol without IV contrast. COMPARISON:  Chest radiograph dated 01/14/2021. FINDINGS: Evaluation of this exam is limited in the absence of intravenous contrast. Cardiovascular: There is no cardiomegaly or pericardial effusion. There is coronary vascular calcification. Moderate atherosclerotic calcification of the thoracic aorta. No aneurysmal dilatation. The central pulmonary arteries are grossly unremarkable. Mediastinum/Nodes: No hilar or mediastinal adenopathy. The esophagus is grossly unremarkable. There is a 2.8 cm exophytic right posterior thyroid nodule. In the setting of significant comorbidities or limited life expectancy, no follow-up recommended (ref: J Am Coll Radiol. 2015 Feb;12(2): 143-50).No mediastinal fluid collection. Lungs/Pleura: There is an area of consolidation with air bronchogram involving the left lung base and a smaller area at the right lung base which may represent atelectasis or infiltrate. There is diffuse nodular densities involving the lower lobes consistent with pneumonia. No pleural effusion or pneumothorax. The central airways are patent. Upper Abdomen: Cholecystectomy. Probable cirrhosis. There is a 1 cm left adrenal adenoma. A 4.5 cm exophytic hypodense lesion from the upper pole of the left kidney, likely a cyst. Musculoskeletal: Osteopenia with degenerative changes of the spine. Anterior bridging osteophyte consistent with dish. No acute osseous pathology. IMPRESSION: 1. Bilateral lower lobe pneumonia. 2. Left adrenal adenoma. 3. Aortic Atherosclerosis (ICD10-I70.0). Electronically Signed   By: Anner Crete M.D.   On: 01/14/2021 19:56   US Venous Img Lower Bilateral (DVT)  Result Date: 01/17/2021 CLINICAL DATA:  Hypoxemia EXAM: BILATERAL LOWER EXTREMITY VENOUS DOPPLER ULTRASOUND TECHNIQUE: Gray-scale  sonography with graded compression, as well as color Doppler and duplex ultrasound were performed to evaluate the lower extremity deep venous systems from the level of the common femoral vein and including the common femoral, femoral, profunda femoral, popliteal and calf veins including the posterior tibial, peroneal and gastrocnemius veins when visible. The superficial great saphenous vein was also interrogated. Spectral Doppler was utilized to evaluate flow at rest and with distal augmentation maneuvers in the common femoral, femoral and popliteal veins. COMPARISON:  CT chest, 01/14/2021. FINDINGS: RIGHT LOWER EXTREMITY VENOUS Normal compressibility of the RIGHT common femoral, superficial femoral, and popliteal veins, as well as the visualized calf veins. Visualized portions of profunda femoral vein and great saphenous vein unremarkable. No filling defects to suggest DVT on grayscale or color Doppler imaging. Doppler  waveforms show normal direction of venous flow, normal respiratory plasticity and response to augmentation. OTHER No evidence of superficial thrombophlebitis or abnormal fluid collection. Limitations: none LEFT LOWER EXTREMITY VENOUS Normal compressibility of the LEFT common femoral, superficial femoral, and popliteal veins, as well as the visualized calf veins. Visualized portions of profunda femoral vein and great saphenous vein unremarkable. No filling defects to suggest DVT on grayscale or color Doppler imaging. Doppler waveforms show normal direction of venous flow, normal respiratory plasticity and response to augmentation. OTHER No evidence of superficial thrombophlebitis or abnormal fluid collection. Limitations: none IMPRESSION: No evidence of femoropopliteal DVT within either lower extremity. Michaelle Birks, MD [facility] Electronically Signed   By: Michaelle Birks M.D.   On: 01/17/2021 16:37   DG Chest Port 1 View  Result Date:  01/14/2021 CLINICAL DATA:  Hypoxemia EXAM: PORTABLE CHEST 1 VIEW COMPARISON:  01/09/2021, 01/06/2021, 07/04/2019 FINDINGS: Suspicion of small bilateral pleural effusions. Streaky basilar opacities. Stable cardiomediastinal silhouette. No pneumothorax. IMPRESSION: Probable small pleural effusions. Streaky basilar opacities may reflect atelectasis versus mild pneumonia Electronically Signed   By: Donavan Foil M.D.   On: 01/14/2021 15:30   DG Chest Port 1 View  Result Date: 01/09/2021 CLINICAL DATA:  Dyspnea. EXAM: PORTABLE CHEST 1 VIEW COMPARISON:  January 06, 2021. FINDINGS: The heart size and mediastinal contours are within normal limits. Both lungs are clear. The visualized skeletal structures are unremarkable. IMPRESSION: No active disease. Electronically Signed   By: Marijo Conception M.D.   On: 01/09/2021 16:24   ECHOCARDIOGRAM COMPLETE  Result Date: 01/07/2021    ECHOCARDIOGRAM REPORT   Patient Name:   SURIE SUCHOCKI Date of Exam: 01/07/2021 Medical Rec #:  [id number]     Height:       66.5 in Accession #:    [id number]    Weight:       264.3 lb Date of Birth:  07/22/1940     BSA:          2.264 m Patient Age:    35 years      BP:           146/74 mmHg Patient Gender: F             HR:           115 bpm. Exam Location:  [facility] Procedure: 2D Echo, Color Doppler and Cardiac Doppler Indications:     I50.31 congestive heart failure-Acute Diastolic  History:         Patient has no prior history of Echocardiogram examinations.                  Risk Factors:Hypertension, Diabetes and HCL.  Sonographer:     Charmayne Sheer Referring Phys:  [id number] Gretta Cool PATEL Diagnosing Phys: Kate Sable MD  Sonographer Comments: Suboptimal parasternal window and suboptimal subcostal window. Image acquisition challenging due to patient body habitus. IMPRESSIONS  1. Left ventricular ejection fraction, by estimation, is 55%. The left ventricle has normal function. Left ventricular endocardial border not optimally defined to  evaluate regional wall motion. Left ventricular diastolic parameters are indeterminate.  2. Right ventricular systolic function is normal. The right ventricular size is normal.  3. The mitral valve is normal in structure. No evidence of mitral valve regurgitation.  4. The aortic valve was not well visualized. Aortic valve regurgitation is not visualized.  5. The inferior vena cava is normal in size with greater than 50% respiratory variability, suggesting  right atrial pressure of 3 mmHg. FINDINGS  Left Ventricle: Left ventricular ejection fraction, by estimation, is 55%. The left ventricle has normal function. Left ventricular endocardial border not optimally defined to evaluate regional wall motion. The global longitudinal strain is normal despite suboptimal segment tracking. The left ventricular internal cavity size was normal in size. There is no left ventricular hypertrophy. Left ventricular diastolic parameters are indeterminate. Right Ventricle: The right ventricular size is normal. No increase in right ventricular wall thickness. Right ventricular systolic function is normal. Left Atrium: Left atrial size was normal in size. Right Atrium: Right atrial size was not well visualized. Pericardium: There is no evidence of pericardial effusion. Mitral Valve: The mitral valve is normal in structure. No evidence of mitral valve regurgitation. MV peak gradient, 13.8 mmHg. The mean mitral valve gradient is 7.0 mmHg. Tricuspid Valve: The tricuspid valve is not well visualized. Tricuspid valve regurgitation is not demonstrated. Aortic Valve: The aortic valve was not well visualized. Aortic valve regurgitation is not visualized. Aortic valve mean gradient measures 9.0 mmHg. Aortic valve peak gradient measures 15.7 mmHg. Aortic valve area, by VTI measures 2.39 cm. Pulmonic Valve: The pulmonic valve was normal in structure. Pulmonic valve regurgitation is not visualized. Aorta: The aortic root is normal in size and  structure. Venous: The inferior vena cava is normal in size with greater than 50% respiratory variability, suggesting right atrial pressure of 3 mmHg. IAS/Shunts: No atrial level shunt detected by color flow Doppler.  LEFT VENTRICLE PLAX 2D LVOT diam:     2.10 cm      Diastology LV SV:         93           LV e' medial:    13.20 cm/s LV SV Index:   41           LV E/e' medial:  12.4 LVOT Area:     3.46 cm     LV e' lateral:   7.40 cm/s                             LV E/e' lateral: 22.2  LV Volumes (MOD) LV vol d, MOD A2C: 105.0 ml LV vol d, MOD A4C: 122.0 ml LV vol s, MOD A2C: 59.9 ml LV vol s, MOD A4C: 64.8 ml LV SV MOD A2C:     45.1 ml LV SV MOD A4C:     122.0 ml LV SV MOD BP:      62.1 ml LEFT ATRIUM           Index LA Vol (A4C): 72.4 ml 31.98 ml/m  AORTIC VALVE                     PULMONIC VALVE AV Area (Vmax):    2.48 cm      PV Vmax:       1.10 m/s AV Area (Vmean):   2.36 cm      PV Vmean:      80.500 cm/s AV Area (VTI):     2.39 cm      PV VTI:        0.229 m AV Vmax:           198.00 cm/s   PV Peak grad:  4.8 mmHg AV Vmean:          142.000 cm/s  PV Mean grad:  3.0 mmHg AV VTI:  0.390 m AV Peak Grad:      15.7 mmHg AV Mean Grad:      9.0 mmHg LVOT Vmax:         142.00 cm/s LVOT Vmean:        96.700 cm/s LVOT VTI:          0.269 m LVOT/AV VTI ratio: 0.69  AORTA Ao Root diam: 2.70 cm MITRAL VALVE MV Area (PHT): 6.27 cm     SHUNTS MV Area VTI:   3.28 cm     Systemic VTI:  0.27 m MV Peak grad:  13.8 mmHg    Systemic Diam: 2.10 cm MV Mean grad:  7.0 mmHg MV Vmax:       1.86 m/s MV Vmean:      123.0 cm/s MV Decel Time: 121 msec MV E velocity: 164.20 cm/s MV A velocity: 142.00 cm/s MV E/A ratio:  1.16 Kate Sable MD Electronically signed by Kate Sable MD Signature Date/Time: 01/07/2021/5:24:31 PM    Final       CLINICAL DATA:  Worsening hypoxia.  Evaluate for possible fusion.   EXAM: CT CHEST WITHOUT CONTRAST   TECHNIQUE: Multidetector CT imaging of the chest was performed  following the standard protocol without IV contrast.   COMPARISON:  Chest radiograph dated 01/14/2021.   FINDINGS: Evaluation of this exam is limited in the absence of intravenous contrast.   Cardiovascular: There is no cardiomegaly or pericardial effusion. There is coronary vascular calcification. Moderate atherosclerotic calcification of the thoracic aorta. No aneurysmal dilatation. The central pulmonary arteries are grossly unremarkable.   Mediastinum/Nodes: No hilar or mediastinal adenopathy. The esophagus is grossly unremarkable. There is a 2.8 cm exophytic right posterior thyroid nodule. In the setting of significant comorbidities or limited life expectancy, no follow-up recommended (ref: J Am Coll Radiol. 2015 Feb;12(2): 143-50).No mediastinal fluid collection.   Lungs/Pleura: There is an area of consolidation with air bronchogram involving the left lung base and a smaller area at the right lung base which may represent atelectasis or infiltrate. There is diffuse nodular densities involving the lower lobes consistent with pneumonia. No pleural effusion or pneumothorax. The central airways are patent.   Upper Abdomen: Cholecystectomy. Probable cirrhosis. There is a 1 cm left adrenal adenoma. A 4.5 cm exophytic hypodense lesion from the upper pole of the left kidney, likely a cyst.   Musculoskeletal: Osteopenia with degenerative changes of the spine. Anterior bridging osteophyte consistent with dish. No acute osseous pathology.   IMPRESSION: 1. Bilateral lower lobe pneumonia. 2. Left adrenal adenoma. 3. Aortic Atherosclerosis (ICD10-I70.0).     Electronically Signed   By: Anner Crete M.D.   On: 01/14/2021 19:56  ASSESSMENT/PLAN   Acute hypoxemic respiratory failure Present on admission due to viral pneumonia secondary to metaneumovirus -patient may have developed bacterial pneumonia superimposed on viral and we will obtain Procalcitonin despite renal  impairment to follow trend. There is MRSA panel negative from 12/8.  Patient has been on anibiotics empirically.  -There is consolidated infiltrate bilaterally worse on left as evidenced above.  -patient should be encouraged to cough and expectorate debris to allow lung recruitment. Flutter valve would help with this.  -there is not much fluid in lungs or around lungs which is helpful because we dont have to diurese with CKD. -She may benefit from more aggressive bronchopulmoary hygiene -will stop doxycycline due to elevated risk of pill induced esophagitis and unlilkely helping much with MRSA negative and having rocephin IV going still.  -Will dc tessalon and  tussinex since its likely hindering cough reflex and in her case coughing would help    Moderate persistent asthma with acute exacerbation  - Due to viral pneumonia with metapneumovirus -patient has been on solumedrol, agree with transition to prednisone, currently on 40mg  -patient is with high phlegm and infiltrates, will advance regiment with Mucomyst 51ml BID 20% -continue Duoneb as ordered    Bibasilar atelectasis     - patient with incentive spirometry      - encourage to use flutter valve      - encourage participation with PT patient was able to stand take few shuffling steps from bed to chair only.     - will advance BPH to metaneb BID with RT x2d  Thank you for allowing me to participate in the care of this patient.  Total face to face encounter time for this patient visit was >45 min. >50% of the time was  spent in counseling and coordination of care.   Patient/Family are satisfied with care plan and all questions have been answered.  This document was prepared using Dragon voice recognition software and may include unintentional dictation errors.     Ottie Glazier, M.D.  Division of [place]

## 2021-01-19 LAB — GLUCOSE, CAPILLARY
Glucose-Capillary: 166 mg/dL — ABNORMAL HIGH (ref 70–99)
Glucose-Capillary: 248 mg/dL — ABNORMAL HIGH (ref 70–99)
Glucose-Capillary: 318 mg/dL — ABNORMAL HIGH (ref 70–99)
Glucose-Capillary: 180 mg/dL — ABNORMAL HIGH (ref 70–99)

## 2021-01-19 MED ORDER — PREDNISONE 20 MG PO TABS
30.0000 mg | ORAL_TABLET | Freq: Every day | ORAL | Status: DC
  Administered 2021-01-20 – 2021-01-21 (×2): 30 mg via ORAL
  Filled 2021-01-19 (×2): qty 1

## 2021-01-19 MED ORDER — GUAIFENESIN ER 600 MG PO TB12: 600.0000 mg | ORAL_TABLET | Freq: Two times a day (BID) | ORAL | Status: DC | PRN

## 2021-01-19 MED ORDER — INSULIN GLARGINE-YFGN 100 UNIT/ML ~~LOC~~ SOLN
24.0000 [IU] | Freq: Every day | SUBCUTANEOUS | Status: DC
  Administered 2021-01-20 – 2021-01-23 (×4): 24 [IU] via SUBCUTANEOUS
  Filled 2021-01-19 (×5): qty 0.24

## 2021-01-19 NOTE — Progress Notes (Signed)
Physical Therapy Treatment Patient Details Name: Brandi Hoover MRN: [id number] DOB: [birth year] Today's Date: 01/19/2021   History of Present Illness Brandi Hoover is an 59yoF who comes to [facility] on 01/06/21 c weakness, SOB, wheezing. PMH: asthma, DM2, HTN, obesity. Pt admitted with ARF c hypoxia. At baseline pt lives alone with 24/5 supervision/asssist from DTR, household AMB c 4WW, no falls in past 6 months.    PT Comments    Pt agrees to session.  Participated in exercises as described below.  To EOB with min a x 1.  She stands x 2 with walker braced but does not feel steady and sits back down.  RN in room and she provides +2 for confidence and she is able to transition to chair at bedside.  Sats decrease to 85% with ex and with transfer.  Pt on 6 lpm HFNC and needs extended rest periods to recover back to baseline in low 90's.  Cues for proper nose breathing with cannula.  Some productive cough with mobility.   Recommendations for follow up therapy are one component of a multi-disciplinary discharge planning process, led by the attending physician.  Recommendations may be updated based on patient status, additional functional criteria and insurance authorization.  Follow Up Recommendations  Skilled nursing-short term rehab (<3 hours/day)     Assistance Recommended at Discharge Frequent or constant Supervision/Assistance  Equipment Recommendations  Other (comment)    Recommendations for Other Services       Precautions / Restrictions Precautions Precautions: Fall Precaution Comments: monitor O2 sats, HR, RR Restrictions Weight Bearing Restrictions: No     Mobility  Bed Mobility Overal bed mobility: Needs Assistance Bed Mobility: Supine to Sit     Supine to sit: Min assist          Transfers Overall transfer level: Needs assistance Equipment used: Rolling walker (2 wheels)   Sit to Stand: Min assist                Ambulation/Gait Ambulation/Gait assistance:  Min assist;+2 safety/equipment Gait Distance (Feet): 3 Feet Assistive device: Rolling walker (2 wheels) Gait Pattern/deviations: Step-to pattern Gait velocity: decreased     General Gait Details: stands to transfer to chair but generally unsteady and poor confidence.  +2 for safety and she is able to transfer to chair.  limited by SOB and sat recovery   Stairs             Wheelchair Mobility    Modified Rankin (Stroke Patients Only)       Balance Overall balance assessment: Needs assistance Sitting-balance support: Feet supported Sitting balance-Leahy Scale: Good     Standing balance support: Bilateral upper extremity supported;During functional activity;Reliant on assistive device for balance Standing balance-Leahy Scale: Fair                              Cognition Arousal/Alertness: Awake/alert Behavior During Therapy: WFL for tasks assessed/performed Overall Cognitive Status: Within Functional Limits for tasks assessed                                          Exercises Other Exercises Other Exercises: supine AROM x 10    General Comments        Pertinent Vitals/Pain Pain Assessment: No/denies pain    Home Living  Prior Function            PT Goals (current goals can now be found in the care plan section) Progress towards PT goals: Progressing toward goals    Frequency    Min 2X/week      PT Plan Current plan remains appropriate    Co-evaluation              AM-PAC PT "6 Clicks" Mobility   Outcome Measure  Help needed turning from your back to your side while in a flat bed without using bedrails?: A Little Help needed moving from lying on your back to sitting on the side of a flat bed without using bedrails?: A Little Help needed moving to and from a bed to a chair (including a wheelchair)?: A Lot Help needed standing up from a chair using your arms (e.g., wheelchair  or bedside chair)?: A Lot Help needed to walk in hospital room?: A Lot Help needed climbing 3-5 steps with a railing? : Total 6 Click Score: 13    End of Session Equipment Utilized During Treatment: Oxygen;Gait belt Activity Tolerance: Patient tolerated treatment well Patient left: in chair;with call bell/phone within reach;with chair alarm set;with nursing/sitter in room;with family/visitor present Nurse Communication: Mobility status PT Visit Diagnosis: Difficulty in walking, not elsewhere classified (R26.2);Other abnormalities of gait and mobility (R26.89);Muscle weakness (generalized) (M62.81)     Time: 6629-4765 PT Time Calculation (min) (ACUTE ONLY): 25 min  Charges:  $Therapeutic Exercise: 8-22 mins $Therapeutic Activity: 8-22 mins                    Chesley Noon, PTA 01/19/21, 11:30 AM

## 2021-01-19 NOTE — TOC Progression Note (Signed)
Transition of Care [facility]) - Progression Note    Patient Details  Name: Brandi Hoover MRN: [id number] Date of Birth: May 14, 1940  Transition of Care [facility]) CM/SW [place], RN Phone Number: 01/19/2021, 3:11 PM  Clinical Narrative:   Patient requires continued medical workup, continued attempts to wean O2 for discharge to SNF.  TOC to continue to follow.    Expected Discharge Plan:  (Home health vs snf) Barriers to Discharge: Continued Medical Work up  Expected Discharge Plan and Services Expected Discharge Plan:  (Home health vs snf)   Discharge Planning Services: CM Consult   Living arrangements for the past 2 months: Single Family Home                                       Social Determinants of Health (SDOH) Interventions    Readmission Risk Interventions No flowsheet data found.

## 2021-01-19 NOTE — Progress Notes (Signed)
PROGRESS NOTE  Brandi Hoover TFT:[id number] DOB: [birth year] DOA: 01/06/2021 PCP: Harrel Lemon, MD  Brief History   Brandi Hoover is a 80 y.o. female with hx of asthma/COPD, diabetes mellitus type 2, hypertension, obesity who presented with complaints of shortness of breath and wheezing for the past few days.  Patient stated that her daughter has been ill with some respiratory illness.    Consultants  None  Procedures  None  Antibiotics   Anti-infectives (From admission, onward)    Start     Dose/Rate Route Frequency Ordered Stop   01/15/21 2200  doxycycline (VIBRA-TABS) tablet 100 mg  Status:  Discontinued        100 mg Oral Every 12 hours 01/15/21 1502 01/17/21 1724   01/15/21 1600  cefTRIAXone (ROCEPHIN) 1 g in sodium chloride 0.9 % 100 mL IVPB        1 g 200 mL/hr over 30 Minutes Intravenous Every 24 hours 01/15/21 1502 01/22/21 1559      Subjective  The patient is resting comfortably. She is feeling improved. Less sob, speaking in full sentences, tolerating diet  Objective   Vitals:  Vitals:   01/19/21 0743 01/19/21 1227  BP: (!) 151/56 (!) 142/57  Pulse: 83 80  Resp: 18 20  Temp: 98.4 F (36.9 C) 97.9 F (36.6 C)  SpO2: 91% 91%    Exam:  Constitutional:  The patient is awake, alert, and oriented x 3. No acute distress. Respiratory:  No increased work of breathing. Rales at bases, few scattered faint rhonchi No wheezes or rales Cardiovascular:  Regular rate and rhythm No murmurs, ectopy, or gallups. Abdomen:  Abdomen is soft, non-tender, non-distended No hernias, masses, or organomegaly Normoactive bowel sounds.  Musculoskeletal:  No cyanosis, clubbing, or edema Skin:  No rashes, lesions, ulcers palpation of skin: no induration or nodules Neurologic:  Moving all 4 extremiteis Psychiatric:  Mental status Mood, affect appropriate Orientation to person, place, time  judgment and insight appear intact  I have personally reviewed the  following:   Today's Data  Vitals  Lab Data  Glucoses  Micro Data  Viral respiratory panel positive for metapneumovirus  Imaging  CXR  Cardiology Data  EKG Echocardiogram  Scheduled Meds:  amLODipine  10 mg Oral Daily   ascorbic acid  250 mg Oral Daily   enoxaparin (LOVENOX) injection  40 mg Subcutaneous Q24H   fluticasone-salmeterol  1 puff Inhalation BID   guaiFENesin  1,200 mg Oral BID   insulin aspart  0-15 Units Subcutaneous TID WC   insulin aspart  0-5 Units Subcutaneous QHS   insulin aspart  3 Units Subcutaneous TID WC   insulin glargine-yfgn  22 Units Subcutaneous Daily   ipratropium-albuterol  3 mL Nebulization Q6H   mouth rinse  15 mL Mouth Rinse BID   multivitamin with minerals  1 tablet Oral Daily   polyethylene glycol  17 g Oral Daily   [START ON 01/20/2021] predniSONE  30 mg Oral Q breakfast   Ensure Max Protein  11 oz Oral QHS   simvastatin  20 mg Oral QHS    Principal Problem:   Respiratory failure with hypoxia (HCC) Active Problems:   COPD (chronic obstructive pulmonary disease) (HCC)   Anemia   Hypertension   Multinodular goiter   Stage 3b chronic kidney disease ([place])   Type 2 diabetes mellitus with stage 3b chronic kidney disease, without long-term current use of insulin ([place])   Acute respiratory failure with hypoxia ([place])   LOS: 12  days    A & P  Acute hypoxemic respiratory failure 2/2 Metapneumovirus PNA --The patient has required up to 10 L O2 by high-flow [place] to maintain O2 sats in low 90's. Unfortunately o2 requirement has plateaued requiring about 6 L to maintain o2 around 90. Symptomatically much improved, though. Dimer wnl for age. Procal is low but ct shows possible superimposed pneumonia. Atelectasis likely also contributing. LE dopplers neg for DVT. PLAN:  --Continue supplemental O2 to keep sats between 88-92%, wean as tolerated. - RT following, continuing flutter valve, IS. Has tried adding metanebs - copd/cap as below as  below - pulmonary now following, working on chest physiotherapy   Asthma/COPD exacerbation Community acquired pneumonia --Pt had both dx in medical records.  --presented with significant dyspnea, rhonchi and wheezes, increased sputum production, and hypoxemia.  Exacerbation triggered by viral PNA, possible superimposed bacterial. Mrsa screen neg. Strep and legionella antigens neg Plan: --cont prednisone, decreased to 30 mg daily today --schedule ipratropium and Xopenex nebs q4h --cont home Advair as Dulera - started ceftriaxone (macrolide allergy) on 12/8, doxy discontinued by pulm on 12/10 (mrsa screen neg)   HTN Bp wnl --cont amlodipine - holding benazepril and hctz  CKD 3A --baseline gfr low 30s, has been upper 20s here. Po is good. - hold acei and hctz as above - monitor   DM2 --pt takes metformin, glipizide and JANUVIA at home. --hold home agents --ACHS and SSI for now - cont semglee   Morbid Obesity:  BMI 42.03. Complicates all cares. Recommend sensible weight loss through diet modification and increased activity as guided by the patient's PCP as outpatient.  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 I have spent 35 minutes in her evaluation and care.   DVT prophylaxis: Lovenox SQ Code Status: Full code pt confirmed she would want intubation. Family Communication: daughter updated telephonically 12/11   Level of care: Telemetry Medical Dispo:   The patient is from: home Anticipated d/c is to: SNF Anticipated d/c date is: undetermined Patient currently is not medically ready to d/c due to: severity of illness  [name], MD [facility] Direct contact: see [email or web address]  7PM-7AM contact night coverage as above

## 2021-01-19 NOTE — Progress Notes (Signed)
PULMONOLOGY         Date: 01/19/2021,   MRN# [id number] Brandi Hoover 22-Jan-1941     AdmissionWeight: 119.9 kg                 CurrentWeight: 119.9 kg   Referring physician: Dr. Si Raider   CHIEF COMPLAINT:   Persistent respiratory failure with hypoxemia    HISTORY OF PRESENT ILLNESS   80 yo F with hx of PMH of Asthma, DM, metabolic syndrome came in 11 days ago with flu like illness after exposure from family with similar symptoms. Patient is generally independent and denied severe symptoms or cardiac or GI or Neuro symptoms. ROS reveals RLE swelling in recent week prior to admission. She was admitted with COPD exacerbation and treated with doxycycline as well as solumedrol. She is noted to have renal impairment on admission of unknown chronicity and anemia. Renal function has improved when reviewing trend. WBC count has slightly treanded up over past 10d while on steroids and anemia is stable around 10.9. Ddimer has also treanded down quite a bit. ABG done 3d on 50%Fio2 is with severe hypoxia and elevated Aa gradient.  A DVT ultrasound study was negative bilaterally on 01/17/21. There was a CT chest done 01/14/21 I reviewed in detail. Patient is in no distress during my evaluation but in on HFNC and we reviewed medical plan together.   01/18/21- patient is sitting up in chair on 6L/min nasal canula reporting improved respiratory status with less labored breathing. She is using flutter and IS we reviewed technique together. Reviewed medical plan with Dr Si Raider who has kindly discussed with family and answered additional questions.   01/19/21- patient is improved and has been slowly gradually improving.  I spoke with Brandi Hoover her daughter today and reviewed medical plan.   PAST MEDICAL HISTORY   Past Medical History:  Diagnosis Date   Asthma    Diabetes mellitus without complication ([place])    High cholesterol    Hypertension      SURGICAL HISTORY   Past Surgical History:   Procedure Laterality Date   right hip replacement       FAMILY HISTORY   Family History  Problem Relation Age of Onset   COPD Father    Stroke Mother      SOCIAL HISTORY   Social History   Tobacco Use   Smoking status: Never   Smokeless tobacco: Never  Substance Use Topics   Alcohol use: No   Drug use: No     MEDICATIONS    Home Medication:    Current Medication:  Current Facility-Administered Medications:    0.9 %  sodium chloride infusion, , Intravenous, PRN, Wouk, Ailene Rud, MD, Stopped at 01/18/21 1859   acetaminophen (TYLENOL) tablet 650 mg, 650 mg, Oral, Q6H PRN, 650 mg at 01/15/21 0900 **OR** acetaminophen (TYLENOL) suppository 650 mg, 650 mg, Rectal, Q6H PRN, Para Skeans, MD   amLODipine (NORVASC) tablet 10 mg, 10 mg, Oral, Daily, Florina Ou V, MD, 10 mg at 01/19/21 1114   ascorbic acid (VITAMIN C) tablet 250 mg, 250 mg, Oral, Daily, Florina Ou V, MD, 250 mg at 01/19/21 1114   cefTRIAXone (ROCEPHIN) 1 g in sodium chloride 0.9 % 100 mL IVPB, 1 g, Intravenous, Q24H, Wouk, Ailene Rud, MD, Stopped at 01/18/21 1926   enoxaparin (LOVENOX) injection 40 mg, 40 mg, Subcutaneous, Q24H, Rauer, Forde Dandy, RPH, 40 mg at 01/18/21 2135   fluticasone-salmeterol (ADVAIR) 250-50 MCG/ACT inhaler 1 puff,  1 puff, Inhalation, BID, Swayze, [name], DO, 1 puff at 01/19/21 0833   guaiFENesin (MUCINEX) 12 hr tablet 1,200 mg, 1,200 mg, Oral, BID, Wouk, Ailene Rud, MD, 1,200 mg at 01/19/21 1114   guaiFENesin-dextromethorphan (ROBITUSSIN DM) 100-10 MG/5ML syrup 5 mL, 5 mL, Oral, Q4H PRN, Swayze, [name], DO   hydrALAZINE (APRESOLINE) injection 5 mg, 5 mg, Intravenous, Q4H PRN, Para Skeans, MD, 5 mg at 01/10/21 1758   insulin aspart (novoLOG) injection 0-15 Units, 0-15 Units, Subcutaneous, TID WC, Enzo Bi, MD, 3 Units at 01/19/21 2595   insulin aspart (novoLOG) injection 0-5 Units, 0-5 Units, Subcutaneous, QHS, Enzo Bi, MD, 2 Units at 01/18/21 2135   insulin aspart (novoLOG)  injection 3 Units, 3 Units, Subcutaneous, TID WC, Wouk, Ailene Rud, MD, 3 Units at 01/19/21 0834   insulin glargine-yfgn (SEMGLEE) injection 22 Units, 22 Units, Subcutaneous, Daily, Wouk, Ailene Rud, MD, 22 Units at 01/19/21 1113   ipratropium-albuterol (DUONEB) 0.5-2.5 (3) MG/3ML nebulizer solution 3 mL, 3 mL, Nebulization, Q6H, Wouk, Ailene Rud, MD, 3 mL at 01/19/21 0757   MEDLINE mouth rinse, 15 mL, Mouth Rinse, BID, Enzo Bi, MD, 15 mL at 01/19/21 1138   multivitamin with minerals tablet 1 tablet, 1 tablet, Oral, Daily, Enzo Bi, MD, 1 tablet at 01/19/21 1114   ondansetron (ZOFRAN) tablet 4 mg, 4 mg, Oral, Q6H PRN **OR** ondansetron (ZOFRAN) injection 4 mg, 4 mg, Intravenous, Q6H PRN, Posey Pronto, Gretta Cool, MD   polyethylene glycol (MIRALAX / GLYCOLAX) packet 17 g, 17 g, Oral, Daily, Wouk, Ailene Rud, MD, 17 g at 01/19/21 1113   predniSONE (DELTASONE) tablet 40 mg, 40 mg, Oral, Q breakfast, Wouk, Ailene Rud, MD, 40 mg at 01/19/21 6387   protein supplement (ENSURE MAX) liquid, 11 oz, Oral, QHS, Enzo Bi, MD, 11 oz at 01/18/21 2138   simvastatin (ZOCOR) tablet 20 mg, 20 mg, Oral, QHS, Florina Ou V, MD, 20 mg at 01/18/21 2136   sodium chloride (OCEAN) 0.65 % nasal spray 1 spray, 1 spray, Each Nare, PRN, Wouk, Ailene Rud, MD, 1 spray at 01/16/21 1242   traMADol (ULTRAM) tablet 50 mg, 50 mg, Oral, Q6H PRN, Para Skeans, MD    ALLERGIES   Mometasone furo-formoterol fum, Azithromycin, Gabapentin, Omeprazole, Other, and Ramipril     REVIEW OF SYSTEMS    Review of Systems:  Gen:  Denies  fever, sweats, chills weigh loss  HEENT: Denies blurred vision, double vision, ear pain, eye pain, hearing loss, nose bleeds, sore throat Cardiac:  No dizziness, chest pain or heaviness, chest tightness,edema Resp:   Denies cough or sputum porduction, shortness of breath,wheezing, hemoptysis,  Gi: Denies swallowing difficulty, stomach pain, nausea or vomiting, diarrhea, constipation, bowel  incontinence Gu:  Denies bladder incontinence, burning urine Ext:   Denies Joint pain, stiffness or swelling Skin: Denies  skin rash, easy bruising or bleeding or hives Endoc:  Denies polyuria, polydipsia , polyphagia or weight change Psych:   Denies depression, insomnia or hallucinations   Other:  All other systems negative   VS: BP (!) 151/56 (BP Location: Left Arm)   Pulse 83   Temp 98.4 F (36.9 C)   Resp 18   Ht 5' 6.5" (1.689 m)   Wt 119.9 kg   SpO2 91%   BMI 42.03 kg/m      PHYSICAL EXAM    GENERAL:NAD, no fevers, chills, no weakness no fatigue HEAD: Normocephalic, atraumatic.  EYES: Pupils equal, round, reactive to light. Extraocular muscles intact. No scleral icterus.  MOUTH: Moist  mucosal membrane. Dentition intact. No abscess noted.  EAR, NOSE, THROAT: Clear without exudates. No external lesions.  NECK: Supple. No thyromegaly. No nodules. No JVD.  PULMONARY: rhonchi bilaterally  CARDIOVASCULAR: S1 and S2. Regular rate and rhythm. No murmurs, rubs, or gallops. No edema. Pedal pulses 2+ bilaterally.  GASTROINTESTINAL: Soft, nontender, nondistended. No masses. Positive bowel sounds. No hepatosplenomegaly.  MUSCULOSKELETAL: No swelling, clubbing, or edema. Range of motion full in all extremities.  NEUROLOGIC: Cranial nerves II through XII are intact. No gross focal neurological deficits. Sensation intact. Reflexes intact.  SKIN: No ulceration, lesions, rashes, or cyanosis. Skin warm and dry. Turgor intact.  PSYCHIATRIC: Mood, affect within normal limits. The patient is awake, alert and oriented x 3. Insight, judgment intact.       IMAGING    DG Chest 2 View  Result Date: 01/06/2021 CLINICAL DATA:  Shortness of breath. EXAM: CHEST - 2 VIEW COMPARISON:  Jul 04, 2019. FINDINGS: Stable cardiomediastinal silhouette. Minimal bibasilar subsegmental atelectasis is noted. Bony thorax is unremarkable. IMPRESSION: Minimal bibasilar subsegmental atelectasis. Electronically  Signed   By: Marijo Conception M.D.   On: 01/06/2021 13:26   CT CHEST WO CONTRAST  Result Date: 01/14/2021 CLINICAL DATA:  Worsening hypoxia.  Evaluate for possible fusion. EXAM: CT CHEST WITHOUT CONTRAST TECHNIQUE: Multidetector CT imaging of the chest was performed following the standard protocol without IV contrast. COMPARISON:  Chest radiograph dated 01/14/2021. FINDINGS: Evaluation of this exam is limited in the absence of intravenous contrast. Cardiovascular: There is no cardiomegaly or pericardial effusion. There is coronary vascular calcification. Moderate atherosclerotic calcification of the thoracic aorta. No aneurysmal dilatation. The central pulmonary arteries are grossly unremarkable. Mediastinum/Nodes: No hilar or mediastinal adenopathy. The esophagus is grossly unremarkable. There is a 2.8 cm exophytic right posterior thyroid nodule. In the setting of significant comorbidities or limited life expectancy, no follow-up recommended (ref: J Am Coll Radiol. 2015 Feb;12(2): 143-50).No mediastinal fluid collection. Lungs/Pleura: There is an area of consolidation with air bronchogram involving the left lung base and a smaller area at the right lung base which may represent atelectasis or infiltrate. There is diffuse nodular densities involving the lower lobes consistent with pneumonia. No pleural effusion or pneumothorax. The central airways are patent. Upper Abdomen: Cholecystectomy. Probable cirrhosis. There is a 1 cm left adrenal adenoma. A 4.5 cm exophytic hypodense lesion from the upper pole of the left kidney, likely a cyst. Musculoskeletal: Osteopenia with degenerative changes of the spine. Anterior bridging osteophyte consistent with dish. No acute osseous pathology. IMPRESSION: 1. Bilateral lower lobe pneumonia. 2. Left adrenal adenoma. 3. Aortic Atherosclerosis (ICD10-I70.0). Electronically Signed   By: Anner Crete M.D.   On: 01/14/2021 19:56   US Venous Img Lower Bilateral (DVT)  Result  Date: 01/17/2021 CLINICAL DATA:  Hypoxemia EXAM: BILATERAL LOWER EXTREMITY VENOUS DOPPLER ULTRASOUND TECHNIQUE: Gray-scale sonography with graded compression, as well as color Doppler and duplex ultrasound were performed to evaluate the lower extremity deep venous systems from the level of the common femoral vein and including the common femoral, femoral, profunda femoral, popliteal and calf veins including the posterior tibial, peroneal and gastrocnemius veins when visible. The superficial great saphenous vein was also interrogated. Spectral Doppler was utilized to evaluate flow at rest and with distal augmentation maneuvers in the common femoral, femoral and popliteal veins. COMPARISON:  CT chest, 01/14/2021. FINDINGS: RIGHT LOWER EXTREMITY VENOUS Normal compressibility of the RIGHT common femoral, superficial femoral, and popliteal veins, as well as the visualized calf veins. Visualized portions of profunda  femoral vein and great saphenous vein unremarkable. No filling defects to suggest DVT on grayscale or color Doppler imaging. Doppler waveforms show normal direction of venous flow, normal respiratory plasticity and response to augmentation. OTHER No evidence of superficial thrombophlebitis or abnormal fluid collection. Limitations: none LEFT LOWER EXTREMITY VENOUS Normal compressibility of the LEFT common femoral, superficial femoral, and popliteal veins, as well as the visualized calf veins. Visualized portions of profunda femoral vein and great saphenous vein unremarkable. No filling defects to suggest DVT on grayscale or color Doppler imaging. Doppler waveforms show normal direction of venous flow, normal respiratory plasticity and response to augmentation. OTHER No evidence of superficial thrombophlebitis or abnormal fluid collection. Limitations: none IMPRESSION: No evidence of femoropopliteal DVT within either lower extremity. Michaelle Birks, MD [facility]  Radiology Electronically Signed   By: Michaelle Birks M.D.   On: 01/17/2021 16:37   DG Chest Port 1 View  Result Date: 01/14/2021 CLINICAL DATA:  Hypoxemia EXAM: PORTABLE CHEST 1 VIEW COMPARISON:  01/09/2021, 01/06/2021, 07/04/2019 FINDINGS: Suspicion of small bilateral pleural effusions. Streaky basilar opacities. Stable cardiomediastinal silhouette. No pneumothorax. IMPRESSION: Probable small pleural effusions. Streaky basilar opacities may reflect atelectasis versus mild pneumonia Electronically Signed   By: Donavan Foil M.D.   On: 01/14/2021 15:30   DG Chest Port 1 View  Result Date: 01/09/2021 CLINICAL DATA:  Dyspnea. EXAM: PORTABLE CHEST 1 VIEW COMPARISON:  January 06, 2021. FINDINGS: The heart size and mediastinal contours are within normal limits. Both lungs are clear. The visualized skeletal structures are unremarkable. IMPRESSION: No active disease. Electronically Signed   By: Marijo Conception M.D.   On: 01/09/2021 16:24   ECHOCARDIOGRAM COMPLETE  Result Date: 01/07/2021    ECHOCARDIOGRAM REPORT   Patient Name:   MARAYA GWILLIAM Date of Exam: 01/07/2021 Medical Rec #:  [id number]     Height:       66.5 in Accession #:    [id number]    Weight:       264.3 lb Date of Birth:  03-05-1940     BSA:          2.264 m Patient Age:    87 years      BP:           146/74 mmHg Patient Gender: F             HR:           115 bpm. Exam Location:  [facility] Procedure: 2D Echo, Color Doppler and Cardiac Doppler Indications:     I50.31 congestive heart failure-Acute Diastolic  History:         Patient has no prior history of Echocardiogram examinations.                  Risk Factors:Hypertension, Diabetes and HCL.  Sonographer:     Charmayne Sheer Referring Phys:  [id number] Gretta Cool PATEL Diagnosing Phys: Kate Sable MD  Sonographer Comments: Suboptimal parasternal window and suboptimal subcostal window. Image acquisition challenging due to patient body habitus. IMPRESSIONS  1. Left ventricular ejection fraction, by  estimation, is 55%. The left ventricle has normal function. Left ventricular endocardial border not optimally defined to evaluate regional wall motion. Left ventricular diastolic parameters are indeterminate.  2. Right ventricular systolic function is normal. The right ventricular size is normal.  3. The mitral valve is normal in structure. No evidence of mitral valve regurgitation.  4. The aortic valve was not well visualized. Aortic valve regurgitation  is not visualized.  5. The inferior vena cava is normal in size with greater than 50% respiratory variability, suggesting right atrial pressure of 3 mmHg. FINDINGS  Left Ventricle: Left ventricular ejection fraction, by estimation, is 55%. The left ventricle has normal function. Left ventricular endocardial border not optimally defined to evaluate regional wall motion. The global longitudinal strain is normal despite suboptimal segment tracking. The left ventricular internal cavity size was normal in size. There is no left ventricular hypertrophy. Left ventricular diastolic parameters are indeterminate. Right Ventricle: The right ventricular size is normal. No increase in right ventricular wall thickness. Right ventricular systolic function is normal. Left Atrium: Left atrial size was normal in size. Right Atrium: Right atrial size was not well visualized. Pericardium: There is no evidence of pericardial effusion. Mitral Valve: The mitral valve is normal in structure. No evidence of mitral valve regurgitation. MV peak gradient, 13.8 mmHg. The mean mitral valve gradient is 7.0 mmHg. Tricuspid Valve: The tricuspid valve is not well visualized. Tricuspid valve regurgitation is not demonstrated. Aortic Valve: The aortic valve was not well visualized. Aortic valve regurgitation is not visualized. Aortic valve mean gradient measures 9.0 mmHg. Aortic valve peak gradient measures 15.7 mmHg. Aortic valve area, by VTI measures 2.39 cm. Pulmonic Valve: The pulmonic valve was  normal in structure. Pulmonic valve regurgitation is not visualized. Aorta: The aortic root is normal in size and structure. Venous: The inferior vena cava is normal in size with greater than 50% respiratory variability, suggesting right atrial pressure of 3 mmHg. IAS/Shunts: No atrial level shunt detected by color flow Doppler.  LEFT VENTRICLE PLAX 2D LVOT diam:     2.10 cm      Diastology LV SV:         93           LV e' medial:    13.20 cm/s LV SV Index:   41           LV E/e' medial:  12.4 LVOT Area:     3.46 cm     LV e' lateral:   7.40 cm/s                             LV E/e' lateral: 22.2  LV Volumes (MOD) LV vol d, MOD A2C: 105.0 ml LV vol d, MOD A4C: 122.0 ml LV vol s, MOD A2C: 59.9 ml LV vol s, MOD A4C: 64.8 ml LV SV MOD A2C:     45.1 ml LV SV MOD A4C:     122.0 ml LV SV MOD BP:      62.1 ml LEFT ATRIUM           Index LA Vol (A4C): 72.4 ml 31.98 ml/m  AORTIC VALVE                     PULMONIC VALVE AV Area (Vmax):    2.48 cm      PV Vmax:       1.10 m/s AV Area (Vmean):   2.36 cm      PV Vmean:      80.500 cm/s AV Area (VTI):     2.39 cm      PV VTI:        0.229 m AV Vmax:           198.00 cm/s   PV Peak grad:  4.8 mmHg AV Vmean:  142.000 cm/s  PV Mean grad:  3.0 mmHg AV VTI:            0.390 m AV Peak Grad:      15.7 mmHg AV Mean Grad:      9.0 mmHg LVOT Vmax:         142.00 cm/s LVOT Vmean:        96.700 cm/s LVOT VTI:          0.269 m LVOT/AV VTI ratio: 0.69  AORTA Ao Root diam: 2.70 cm MITRAL VALVE MV Area (PHT): 6.27 cm     SHUNTS MV Area VTI:   3.28 cm     Systemic VTI:  0.27 m MV Peak grad:  13.8 mmHg    Systemic Diam: 2.10 cm MV Mean grad:  7.0 mmHg MV Vmax:       1.86 m/s MV Vmean:      123.0 cm/s MV Decel Time: 121 msec MV E velocity: 164.20 cm/s MV A velocity: 142.00 cm/s MV E/A ratio:  1.16 Kate Sable MD Electronically signed by Kate Sable MD Signature Date/Time: 01/07/2021/5:24:31 PM    Final       CLINICAL DATA:  Worsening hypoxia.  Evaluate for possible  fusion.   EXAM: CT CHEST WITHOUT CONTRAST   TECHNIQUE: Multidetector CT imaging of the chest was performed following the standard protocol without IV contrast.   COMPARISON:  Chest radiograph dated 01/14/2021.   FINDINGS: Evaluation of this exam is limited in the absence of intravenous contrast.   Cardiovascular: There is no cardiomegaly or pericardial effusion. There is coronary vascular calcification. Moderate atherosclerotic calcification of the thoracic aorta. No aneurysmal dilatation. The central pulmonary arteries are grossly unremarkable.   Mediastinum/Nodes: No hilar or mediastinal adenopathy. The esophagus is grossly unremarkable. There is a 2.8 cm exophytic right posterior thyroid nodule. In the setting of significant comorbidities or limited life expectancy, no follow-up recommended (ref: J Am Coll Radiol. 2015 Feb;12(2): 143-50).No mediastinal fluid collection.   Lungs/Pleura: There is an area of consolidation with air bronchogram involving the left lung base and a smaller area at the right lung base which may represent atelectasis or infiltrate. There is diffuse nodular densities involving the lower lobes consistent with pneumonia. No pleural effusion or pneumothorax. The central airways are patent.   Upper Abdomen: Cholecystectomy. Probable cirrhosis. There is a 1 cm left adrenal adenoma. A 4.5 cm exophytic hypodense lesion from the upper pole of the left kidney, likely a cyst.   Musculoskeletal: Osteopenia with degenerative changes of the spine. Anterior bridging osteophyte consistent with dish. No acute osseous pathology.   IMPRESSION: 1. Bilateral lower lobe pneumonia. 2. Left adrenal adenoma. 3. Aortic Atherosclerosis (ICD10-I70.0).     Electronically Signed   By: Anner Crete M.D.   On: 01/14/2021 19:56  ASSESSMENT/PLAN   Acute hypoxemic respiratory failure Present on admission due to viral pneumonia secondary to metaneumovirus -patient  may have developed bacterial pneumonia superimposed on viral and we will obtain Procalcitonin despite renal impairment to follow trend. There is MRSA panel negative from 12/8.  Patient has been on anibiotics empirically.  -There is consolidated infiltrate bilaterally worse on left as evidenced above.  -patient should be encouraged to cough and expectorate debris to allow lung recruitment. Flutter valve would help with this.  -there is not much fluid in lungs or around lungs which is helpful because we dont have to diurese with CKD. -She may benefit from more aggressive bronchopulmoary hygiene -will stop doxycycline due to elevated risk  of pill induced esophagitis and unlilkely helping much with MRSA negative and having rocephin IV going still.  -Will dc tessalon and tussinex since its likely hindering cough reflex and in her case coughing would help    Moderate persistent asthma with acute exacerbation  - Due to viral pneumonia with metapneumovirus -patient has been on solumedrol, agree with transition to prednisone, currently on 40mg  -patient is with high phlegm and infiltrates, will advance regiment with Mucomyst 36ml BID 20% -continue Duoneb as ordered    Bibasilar atelectasis     - patient with incentive spirometry      - encourage to use flutter valve      - encourage participation with PT patient was able to stand take few shuffling steps from bed to chair only.     - will advance BPH to metaneb BID with RT x2d  Thank you for allowing me to participate in the care of this patient.  Total face to face encounter time for this patient visit was >45 min. >50% of the time was  spent in counseling and coordination of care.   Patient/Family are satisfied with care plan and all questions have been answered.  This document was prepared using Dragon voice recognition software and may include unintentional dictation errors.     Ottie Glazier, M.D.  Division of [place]

## 2021-01-20 LAB — BASIC METABOLIC PANEL
Anion gap: 6 (ref 5–15)
BUN: 55 mg/dL — ABNORMAL HIGH (ref 8–23)
CO2: 29 mmol/L (ref 22–32)
Calcium: 8.3 mg/dL — ABNORMAL LOW (ref 8.9–10.3)
Chloride: 104 mmol/L (ref 98–111)
Creatinine, Ser: 1.71 mg/dL — ABNORMAL HIGH (ref 0.44–1.00)
GFR, Estimated: 30 mL/min — ABNORMAL LOW (ref >60)
Glucose, Bld: 106 mg/dL — ABNORMAL HIGH (ref 70–99)
Potassium: 4.6 mmol/L (ref 3.5–5.1)
Sodium: 139 mmol/L (ref 135–145)

## 2021-01-20 LAB — GLUCOSE, CAPILLARY
Glucose-Capillary: 110 mg/dL — ABNORMAL HIGH (ref 70–99)
Glucose-Capillary: 201 mg/dL — ABNORMAL HIGH (ref 70–99)
Glucose-Capillary: 99 mg/dL (ref 70–99)
Glucose-Capillary: 242 mg/dL — ABNORMAL HIGH (ref 70–99)

## 2021-01-20 NOTE — Progress Notes (Signed)
PULMONOLOGY         Date: 01/20/2021,   MRN# [id number] Brandi Hoover 04/27/1940     AdmissionWeight: 119.9 kg                 CurrentWeight: 119.9 kg   Referring physician: Dr. Si Raider   CHIEF COMPLAINT:   Persistent respiratory failure with hypoxemia    HISTORY OF PRESENT ILLNESS   80 yo F with hx of PMH of Asthma, DM, metabolic syndrome came in 11 days ago with flu like illness after exposure from family with similar symptoms. Patient is generally independent and denied severe symptoms or cardiac or GI or Neuro symptoms. ROS reveals RLE swelling in recent week prior to admission. She was admitted with COPD exacerbation and treated with doxycycline as well as solumedrol. She is noted to have renal impairment on admission of unknown chronicity and anemia. Renal function has improved when reviewing trend. WBC count has slightly treanded up over past 10d while on steroids and anemia is stable around 10.9. Ddimer has also treanded down quite a bit. ABG done 3d on 50%Fio2 is with severe hypoxia and elevated Aa gradient.  A DVT ultrasound study was negative bilaterally on 01/17/21. There was a CT chest done 01/14/21 I reviewed in detail. Patient is in no distress during my evaluation but in on HFNC and we reviewed medical plan together.     01/20/21- patient is improved she is less labored and reports less dyspnea. She speaks in full sentences.  I met with daughter and we reviewed patients current condition.  Plan reviewed with attending physician Dr Si Raider and patient will remain admitted for at least 24h more until respiratory status is more stable.    PAST MEDICAL HISTORY   Past Medical History:  Diagnosis Date   Asthma    Diabetes mellitus without complication ([place])    High cholesterol    Hypertension      SURGICAL HISTORY   Past Surgical History:  Procedure Laterality Date   right hip replacement       FAMILY HISTORY   Family History  Problem Relation Age of  Onset   COPD Father    Stroke Mother      SOCIAL HISTORY   Social History   Tobacco Use   Smoking status: Never   Smokeless tobacco: Never  Substance Use Topics   Alcohol use: No   Drug use: No     MEDICATIONS    Home Medication:    Current Medication:  Current Facility-Administered Medications:    0.9 %  sodium chloride infusion, , Intravenous, PRN, Wouk, Ailene Rud, MD, Stopped at 01/19/21 2014   acetaminophen (TYLENOL) tablet 650 mg, 650 mg, Oral, Q6H PRN, 650 mg at 01/15/21 0900 **OR** acetaminophen (TYLENOL) suppository 650 mg, 650 mg, Rectal, Q6H PRN, Para Skeans, MD   amLODipine (NORVASC) tablet 10 mg, 10 mg, Oral, Daily, Florina Ou V, MD, 10 mg at 01/20/21 1030   ascorbic acid (VITAMIN C) tablet 250 mg, 250 mg, Oral, Daily, Florina Ou V, MD, 250 mg at 01/20/21 1030   cefTRIAXone (ROCEPHIN) 1 g in sodium chloride 0.9 % 100 mL IVPB, 1 g, Intravenous, Q24H, Wouk, Ailene Rud, MD, Stopped at 01/19/21 1850   enoxaparin (LOVENOX) injection 40 mg, 40 mg, Subcutaneous, Q24H, Rauer, Forde Dandy, RPH, 40 mg at 01/19/21 2103   fluticasone-salmeterol (ADVAIR) 250-50 MCG/ACT inhaler 1 puff, 1 puff, Inhalation, BID, Swayze, [name], DO, 1 puff at 01/20/21 1028  guaiFENesin (MUCINEX) 12 hr tablet 1,200 mg, 1,200 mg, Oral, BID, Wouk, Ailene Rud, MD, 1,200 mg at 01/20/21 1030   guaiFENesin (MUCINEX) 12 hr tablet 600 mg, 600 mg, Oral, BID PRN, Ottie Glazier, MD   hydrALAZINE (APRESOLINE) injection 5 mg, 5 mg, Intravenous, Q4H PRN, Para Skeans, MD, 5 mg at 01/10/21 1758   insulin aspart (novoLOG) injection 0-15 Units, 0-15 Units, Subcutaneous, TID WC, Enzo Bi, MD, 5 Units at 01/20/21 1206   insulin aspart (novoLOG) injection 0-5 Units, 0-5 Units, Subcutaneous, QHS, Enzo Bi, MD, 2 Units at 01/18/21 2135   insulin aspart (novoLOG) injection 3 Units, 3 Units, Subcutaneous, TID WC, Wouk, Ailene Rud, MD, 3 Units at 01/20/21 1206   insulin glargine-yfgn (SEMGLEE) injection 24  Units, 24 Units, Subcutaneous, Daily, Wouk, Ailene Rud, MD, 24 Units at 01/20/21 1031   ipratropium-albuterol (DUONEB) 0.5-2.5 (3) MG/3ML nebulizer solution 3 mL, 3 mL, Nebulization, Q6H, Wouk, Ailene Rud, MD, 3 mL at 01/20/21 0802   MEDLINE mouth rinse, 15 mL, Mouth Rinse, BID, Enzo Bi, MD, 15 mL at 01/20/21 1031   multivitamin with minerals tablet 1 tablet, 1 tablet, Oral, Daily, Enzo Bi, MD, 1 tablet at 01/20/21 1029   ondansetron (ZOFRAN) tablet 4 mg, 4 mg, Oral, Q6H PRN **OR** ondansetron (ZOFRAN) injection 4 mg, 4 mg, Intravenous, Q6H PRN, Posey Pronto, Gretta Cool, MD   polyethylene glycol (MIRALAX / GLYCOLAX) packet 17 g, 17 g, Oral, Daily, Wouk, Ailene Rud, MD, 17 g at 01/20/21 1029   predniSONE (DELTASONE) tablet 30 mg, 30 mg, Oral, Q breakfast, Ottie Glazier, MD, 30 mg at 01/20/21 1029   protein supplement (ENSURE MAX) liquid, 11 oz, Oral, QHS, Enzo Bi, MD, 11 oz at 01/18/21 2138   simvastatin (ZOCOR) tablet 20 mg, 20 mg, Oral, QHS, Florina Ou V, MD, 20 mg at 01/19/21 2103   sodium chloride (OCEAN) 0.65 % nasal spray 1 spray, 1 spray, Each Nare, PRN, Wouk, Ailene Rud, MD, 1 spray at 01/16/21 1242   traMADol (ULTRAM) tablet 50 mg, 50 mg, Oral, Q6H PRN, Para Skeans, MD    ALLERGIES   Mometasone furo-formoterol fum, Azithromycin, Gabapentin, Omeprazole, Other, and Ramipril     REVIEW OF SYSTEMS    Review of Systems:  Gen:  Denies  fever, sweats, chills weigh loss  HEENT: Denies blurred vision, double vision, ear pain, eye pain, hearing loss, nose bleeds, sore throat Cardiac:  No dizziness, chest pain or heaviness, chest tightness,edema Resp:   Denies cough or sputum porduction, shortness of breath,wheezing, hemoptysis,  Gi: Denies swallowing difficulty, stomach pain, nausea or vomiting, diarrhea, constipation, bowel incontinence Gu:  Denies bladder incontinence, burning urine Ext:   Denies Joint pain, stiffness or swelling Skin: Denies  skin rash, easy bruising or  bleeding or hives Endoc:  Denies polyuria, polydipsia , polyphagia or weight change Psych:   Denies depression, insomnia or hallucinations   Other:  All other systems negative   VS: BP 132/76 (BP Location: Left Arm)    Pulse 76    Temp 98 F (36.7 C)    Resp 16    Ht 5' 6.5" (1.689 m)    Wt 119.9 kg    SpO2 94%    BMI 42.03 kg/m      PHYSICAL EXAM    GENERAL:NAD, no fevers, chills, no weakness no fatigue HEAD: Normocephalic, atraumatic.  EYES: Pupils equal, round, reactive to light. Extraocular muscles intact. No scleral icterus.  MOUTH: Moist mucosal membrane. Dentition intact. No abscess noted.  EAR, NOSE,  THROAT: Clear without exudates. No external lesions.  NECK: Supple. No thyromegaly. No nodules. No JVD.  PULMONARY: rhonchi bilaterally  CARDIOVASCULAR: S1 and S2. Regular rate and rhythm. No murmurs, rubs, or gallops. No edema. Pedal pulses 2+ bilaterally.  GASTROINTESTINAL: Soft, nontender, nondistended. No masses. Positive bowel sounds. No hepatosplenomegaly.  MUSCULOSKELETAL: No swelling, clubbing, or edema. Range of motion full in all extremities.  NEUROLOGIC: Cranial nerves II through XII are intact. No gross focal neurological deficits. Sensation intact. Reflexes intact.  SKIN: No ulceration, lesions, rashes, or cyanosis. Skin warm and dry. Turgor intact.  PSYCHIATRIC: Mood, affect within normal limits. The patient is awake, alert and oriented x 3. Insight, judgment intact.       IMAGING    DG Chest 2 View  Result Date: 01/06/2021 CLINICAL DATA:  Shortness of breath. EXAM: CHEST - 2 VIEW COMPARISON:  Jul 04, 2019. FINDINGS: Stable cardiomediastinal silhouette. Minimal bibasilar subsegmental atelectasis is noted. Bony thorax is unremarkable. IMPRESSION: Minimal bibasilar subsegmental atelectasis. Electronically Signed   By: Marijo Conception M.D.   On: 01/06/2021 13:26   CT CHEST WO CONTRAST  Result Date: 01/14/2021 CLINICAL DATA:  Worsening hypoxia.  Evaluate for  possible fusion. EXAM: CT CHEST WITHOUT CONTRAST TECHNIQUE: Multidetector CT imaging of the chest was performed following the standard protocol without IV contrast. COMPARISON:  Chest radiograph dated 01/14/2021. FINDINGS: Evaluation of this exam is limited in the absence of intravenous contrast. Cardiovascular: There is no cardiomegaly or pericardial effusion. There is coronary vascular calcification. Moderate atherosclerotic calcification of the thoracic aorta. No aneurysmal dilatation. The central pulmonary arteries are grossly unremarkable. Mediastinum/Nodes: No hilar or mediastinal adenopathy. The esophagus is grossly unremarkable. There is a 2.8 cm exophytic right posterior thyroid nodule. In the setting of significant comorbidities or limited life expectancy, no follow-up recommended (ref: J Am Coll Radiol. 2015 Feb;12(2): 143-50).No mediastinal fluid collection. Lungs/Pleura: There is an area of consolidation with air bronchogram involving the left lung base and a smaller area at the right lung base which may represent atelectasis or infiltrate. There is diffuse nodular densities involving the lower lobes consistent with pneumonia. No pleural effusion or pneumothorax. The central airways are patent. Upper Abdomen: Cholecystectomy. Probable cirrhosis. There is a 1 cm left adrenal adenoma. A 4.5 cm exophytic hypodense lesion from the upper pole of the left kidney, likely a cyst. Musculoskeletal: Osteopenia with degenerative changes of the spine. Anterior bridging osteophyte consistent with dish. No acute osseous pathology. IMPRESSION: 1. Bilateral lower lobe pneumonia. 2. Left adrenal adenoma. 3. Aortic Atherosclerosis (ICD10-I70.0). Electronically Signed   By: Anner Crete M.D.   On: 01/14/2021 19:56   US Venous Img Lower Bilateral (DVT)  Result Date: 01/17/2021 CLINICAL DATA:  Hypoxemia EXAM: BILATERAL LOWER EXTREMITY VENOUS DOPPLER ULTRASOUND TECHNIQUE: Gray-scale sonography with graded  compression, as well as color Doppler and duplex ultrasound were performed to evaluate the lower extremity deep venous systems from the level of the common femoral vein and including the common femoral, femoral, profunda femoral, popliteal and calf veins including the posterior tibial, peroneal and gastrocnemius veins when visible. The superficial great saphenous vein was also interrogated. Spectral Doppler was utilized to evaluate flow at rest and with distal augmentation maneuvers in the common femoral, femoral and popliteal veins. COMPARISON:  CT chest, 01/14/2021. FINDINGS: RIGHT LOWER EXTREMITY VENOUS Normal compressibility of the RIGHT common femoral, superficial femoral, and popliteal veins, as well as the visualized calf veins. Visualized portions of profunda femoral vein and great saphenous vein unremarkable. No filling defects  to suggest DVT on grayscale or color Doppler imaging. Doppler waveforms show normal direction of venous flow, normal respiratory plasticity and response to augmentation. OTHER No evidence of superficial thrombophlebitis or abnormal fluid collection. Limitations: none LEFT LOWER EXTREMITY VENOUS Normal compressibility of the LEFT common femoral, superficial femoral, and popliteal veins, as well as the visualized calf veins. Visualized portions of profunda femoral vein and great saphenous vein unremarkable. No filling defects to suggest DVT on grayscale or color Doppler imaging. Doppler waveforms show normal direction of venous flow, normal respiratory plasticity and response to augmentation. OTHER No evidence of superficial thrombophlebitis or abnormal fluid collection. Limitations: none IMPRESSION: No evidence of femoropopliteal DVT within either lower extremity. Michaelle Birks, MD [facility] Electronically Signed   By: Michaelle Birks M.D.   On: 01/17/2021 16:37   DG Chest Port 1 View  Result Date: 01/14/2021 CLINICAL DATA:   Hypoxemia EXAM: PORTABLE CHEST 1 VIEW COMPARISON:  01/09/2021, 01/06/2021, 07/04/2019 FINDINGS: Suspicion of small bilateral pleural effusions. Streaky basilar opacities. Stable cardiomediastinal silhouette. No pneumothorax. IMPRESSION: Probable small pleural effusions. Streaky basilar opacities may reflect atelectasis versus mild pneumonia Electronically Signed   By: Donavan Foil M.D.   On: 01/14/2021 15:30   DG Chest Port 1 View  Result Date: 01/09/2021 CLINICAL DATA:  Dyspnea. EXAM: PORTABLE CHEST 1 VIEW COMPARISON:  January 06, 2021. FINDINGS: The heart size and mediastinal contours are within normal limits. Both lungs are clear. The visualized skeletal structures are unremarkable. IMPRESSION: No active disease. Electronically Signed   By: Marijo Conception M.D.   On: 01/09/2021 16:24   ECHOCARDIOGRAM COMPLETE  Result Date: 01/07/2021    ECHOCARDIOGRAM REPORT   Patient Name:   Brandi Hoover Date of Exam: 01/07/2021 Medical Rec #:  [id number]     Height:       66.5 in Accession #:    [id number]    Weight:       264.3 lb Date of Birth:  1940-09-01     BSA:          2.264 m Patient Age:    40 years      BP:           146/74 mmHg Patient Gender: F             HR:           115 bpm. Exam Location:  [facility] Procedure: 2D Echo, Color Doppler and Cardiac Doppler Indications:     I50.31 congestive heart failure-Acute Diastolic  History:         Patient has no prior history of Echocardiogram examinations.                  Risk Factors:Hypertension, Diabetes and HCL.  Sonographer:     Charmayne Sheer Referring Phys:  [id number] Gretta Cool PATEL Diagnosing Phys: Kate Sable MD  Sonographer Comments: Suboptimal parasternal window and suboptimal subcostal window. Image acquisition challenging due to patient body habitus. IMPRESSIONS  1. Left ventricular ejection fraction, by estimation, is 55%. The left ventricle has normal function. Left ventricular endocardial border not optimally defined to evaluate regional wall motion.  Left ventricular diastolic parameters are indeterminate.  2. Right ventricular systolic function is normal. The right ventricular size is normal.  3. The mitral valve is normal in structure. No evidence of mitral valve regurgitation.  4. The aortic valve was not well visualized. Aortic valve regurgitation is not visualized.  5. The inferior vena cava is  normal in size with greater than 50% respiratory variability, suggesting right atrial pressure of 3 mmHg. FINDINGS  Left Ventricle: Left ventricular ejection fraction, by estimation, is 55%. The left ventricle has normal function. Left ventricular endocardial border not optimally defined to evaluate regional wall motion. The global longitudinal strain is normal despite suboptimal segment tracking. The left ventricular internal cavity size was normal in size. There is no left ventricular hypertrophy. Left ventricular diastolic parameters are indeterminate. Right Ventricle: The right ventricular size is normal. No increase in right ventricular wall thickness. Right ventricular systolic function is normal. Left Atrium: Left atrial size was normal in size. Right Atrium: Right atrial size was not well visualized. Pericardium: There is no evidence of pericardial effusion. Mitral Valve: The mitral valve is normal in structure. No evidence of mitral valve regurgitation. MV peak gradient, 13.8 mmHg. The mean mitral valve gradient is 7.0 mmHg. Tricuspid Valve: The tricuspid valve is not well visualized. Tricuspid valve regurgitation is not demonstrated. Aortic Valve: The aortic valve was not well visualized. Aortic valve regurgitation is not visualized. Aortic valve mean gradient measures 9.0 mmHg. Aortic valve peak gradient measures 15.7 mmHg. Aortic valve area, by VTI measures 2.39 cm. Pulmonic Valve: The pulmonic valve was normal in structure. Pulmonic valve regurgitation is not visualized. Aorta: The aortic root is normal in size and structure. Venous: The inferior vena  cava is normal in size with greater than 50% respiratory variability, suggesting right atrial pressure of 3 mmHg. IAS/Shunts: No atrial level shunt detected by color flow Doppler.  LEFT VENTRICLE PLAX 2D LVOT diam:     2.10 cm      Diastology LV SV:         93           LV e' medial:    13.20 cm/s LV SV Index:   41           LV E/e' medial:  12.4 LVOT Area:     3.46 cm     LV e' lateral:   7.40 cm/s                             LV E/e' lateral: 22.2  LV Volumes (MOD) LV vol d, MOD A2C: 105.0 ml LV vol d, MOD A4C: 122.0 ml LV vol s, MOD A2C: 59.9 ml LV vol s, MOD A4C: 64.8 ml LV SV MOD A2C:     45.1 ml LV SV MOD A4C:     122.0 ml LV SV MOD BP:      62.1 ml LEFT ATRIUM           Index LA Vol (A4C): 72.4 ml 31.98 ml/m  AORTIC VALVE                     PULMONIC VALVE AV Area (Vmax):    2.48 cm      PV Vmax:       1.10 m/s AV Area (Vmean):   2.36 cm      PV Vmean:      80.500 cm/s AV Area (VTI):     2.39 cm      PV VTI:        0.229 m AV Vmax:           198.00 cm/s   PV Peak grad:  4.8 mmHg AV Vmean:          142.000 cm/s  PV Mean grad:  3.0 mmHg AV VTI:            0.390 m AV Peak Grad:      15.7 mmHg AV Mean Grad:      9.0 mmHg LVOT Vmax:         142.00 cm/s LVOT Vmean:        96.700 cm/s LVOT VTI:          0.269 m LVOT/AV VTI ratio: 0.69  AORTA Ao Root diam: 2.70 cm MITRAL VALVE MV Area (PHT): 6.27 cm     SHUNTS MV Area VTI:   3.28 cm     Systemic VTI:  0.27 m MV Peak grad:  13.8 mmHg    Systemic Diam: 2.10 cm MV Mean grad:  7.0 mmHg MV Vmax:       1.86 m/s MV Vmean:      123.0 cm/s MV Decel Time: 121 msec MV E velocity: 164.20 cm/s MV A velocity: 142.00 cm/s MV E/A ratio:  1.16 Kate Sable MD Electronically signed by Kate Sable MD Signature Date/Time: 01/07/2021/5:24:31 PM    Final       CLINICAL DATA:  Worsening hypoxia.  Evaluate for possible fusion.   EXAM: CT CHEST WITHOUT CONTRAST   TECHNIQUE: Multidetector CT imaging of the chest was performed following the standard protocol without  IV contrast.   COMPARISON:  Chest radiograph dated 01/14/2021.   FINDINGS: Evaluation of this exam is limited in the absence of intravenous contrast.   Cardiovascular: There is no cardiomegaly or pericardial effusion. There is coronary vascular calcification. Moderate atherosclerotic calcification of the thoracic aorta. No aneurysmal dilatation. The central pulmonary arteries are grossly unremarkable.   Mediastinum/Nodes: No hilar or mediastinal adenopathy. The esophagus is grossly unremarkable. There is a 2.8 cm exophytic right posterior thyroid nodule. In the setting of significant comorbidities or limited life expectancy, no follow-up recommended (ref: J Am Coll Radiol. 2015 Feb;12(2): 143-50).No mediastinal fluid collection.   Lungs/Pleura: There is an area of consolidation with air bronchogram involving the left lung base and a smaller area at the right lung base which may represent atelectasis or infiltrate. There is diffuse nodular densities involving the lower lobes consistent with pneumonia. No pleural effusion or pneumothorax. The central airways are patent.   Upper Abdomen: Cholecystectomy. Probable cirrhosis. There is a 1 cm left adrenal adenoma. A 4.5 cm exophytic hypodense lesion from the upper pole of the left kidney, likely a cyst.   Musculoskeletal: Osteopenia with degenerative changes of the spine. Anterior bridging osteophyte consistent with dish. No acute osseous pathology.   IMPRESSION: 1. Bilateral lower lobe pneumonia. 2. Left adrenal adenoma. 3. Aortic Atherosclerosis (ICD10-I70.0).     Electronically Signed   By: Anner Crete M.D.   On: 01/14/2021 19:56  ASSESSMENT/PLAN   Acute hypoxemic respiratory failure Present on admission due to viral pneumonia secondary to metaneumovirus -patient may have developed bacterial pneumonia superimposed on viral and we will obtain Procalcitonin despite renal impairment to follow trend. There is MRSA panel  negative from 12/8.  Patient has been on anibiotics empirically.  -There is consolidated infiltrate bilaterally worse on left as evidenced above.  -patient should be encouraged to cough and expectorate debris to allow lung recruitment. Flutter valve would help with this.  -there is not much fluid in lungs or around lungs which is helpful because we dont have to diurese with CKD. -She may benefit from more aggressive bronchopulmoary hygiene -will stop doxycycline due to elevated risk of pill induced esophagitis and unlilkely helping  much with MRSA negative and having rocephin IV going still.  -Will dc tessalon and tussinex since its likely hindering cough reflex and in her case coughing would help    Moderate persistent asthma with acute exacerbation  - Due to viral pneumonia with metapneumovirus -patient has been on solumedrol, agree with transition to prednisone, currently on 54m -patient is with high phlegm and infiltrates, will advance regiment with Mucomyst 416mBID 20% -continue Duoneb as ordered    Bibasilar atelectasis     - patient with incentive spirometry      - encourage to use flutter valve      - encourage participation with PT patient was able to stand take few shuffling steps from bed to chair only.     - will advance BPH to metaneb BID with RT x2d  Thank you for allowing me to participate in the care of this patient.  Total face to face encounter time for this patient visit was >45 min. >50% of the time was  spent in counseling and coordination of care.   Patient/Family are satisfied with care plan and all questions have been answered.  This document was prepared using Dragon voice recognition software and may include unintentional dictation errors.     FuOttie GlazierM.D.  [facility] [place]

## 2021-01-20 NOTE — Evaluation (Signed)
Occupational Therapy Evaluation Patient Details Name: LAURELL COALSON MRN: [id number] DOB: 04/15/1940 Today's Date: 01/20/2021   History of Present Illness Shamaria Kavan is an 87yoF who comes to [facility] on 01/06/21 c weakness, SOB, wheezing. PMH: asthma, DM2, HTN, obesity. Pt admitted with ARF c hypoxia. At baseline pt lives alone with 24/5 supervision/asssist from DTR, household AMB c 4WW, no falls in past 6 months.   Clinical Impression   Pt seen for re-evaluation and tx session. Dtr present. Pt endorses feeling better, breathing better. Pt has demonstrated improvements in ADL/mobility since initial evaluation. Pt now requiring MIN-MOD A for LB ADL tasks and MIN A for ADL transfers + RW. Pt shares that family member brought her a grippy memory foam mat to stand on which pt endorses makes her feel more confident with transfers because her feet don't slip out from under her. Pt/dtr educated in falls prevention and how improving her confidence will help decrease her falls risk. Extensive review of incentive spirometer and acapella with pt able to return demo with decreasing VC required for technique. Pt/dtr instructed in frequency to perform. SpO2 noted with improvement after use and able to sustain across session. Pt on 6L O2, SpO2 91-92% at rest, improved to 92-94%. Pt continues to benefit from skilled OT services to maximize return to PLOF and minimize falls risk and increased caregiver burden. Continue to recommend SNF. Pt/family in agreement.      Recommendations for follow up therapy are one component of a multi-disciplinary discharge planning process, led by the attending physician.  Recommendations may be updated based on patient status, additional functional criteria and insurance authorization.   Follow Up Recommendations  Skilled nursing-short term rehab (<3 hours/day)    Assistance Recommended at Discharge Intermittent Supervision/Assistance  Functional Status Assessment  Patient has had a  recent decline in their functional status and demonstrates the ability to make significant improvements in function in a reasonable and predictable amount of time.  Equipment Recommendations  BSC/3in1    Recommendations for Other Services       Precautions / Restrictions Precautions Precautions: Fall Precaution Comments: monitor O2 sats, HR, RR Restrictions Weight Bearing Restrictions: No      Mobility Bed Mobility               General bed mobility comments: NT up in recliner    Transfers                          Balance                                           ADL either performed or assessed with clinical judgement   ADL Overall ADL's : Needs assistance/impaired                                       General ADL Comments: Pt currently requires MIN A for LB ADL with MIN A + RW for ADL transfers, VC for PLB     Vision         Perception     Praxis      Pertinent Vitals/Pain Pain Assessment: No/denies pain     Hand Dominance     Extremity/Trunk Assessment Upper Extremity Assessment Upper Extremity Assessment: Generalized weakness  Lower Extremity Assessment Lower Extremity Assessment: Generalized weakness       Communication Communication Communication: No difficulties   Cognition Arousal/Alertness: Awake/alert Behavior During Therapy: WFL for tasks assessed/performed Overall Cognitive Status: Within Functional Limits for tasks assessed                                 General Comments: Pt endorses feeling less anxious, more confident     General Comments       Exercises Other Exercises Other Exercises: Extensive review of incentive spirometer and acapella with pt able to return demo with decreasing VC required for technique. Pt/dtr instructed in frequency to perform. SpO2 noted with improvement after use and able to sustain across session. Other Exercises: Pt/dtr instructed in  AE/DME for home use   Shoulder Instructions      Home Living Family/patient expects to be discharged to:: Private residence Living Arrangements: Alone Available Help at Discharge: Family (DTR, and SIL helps at time; essentially there; DTR there 24hrs, M-F then home on weekend.) Type of Home: House Home Access: Ramped entrance     Home Layout: One level     Bathroom Shower/Tub: Sponge bathes at baseline;Tub/shower unit;Tub only         Home Equipment: Rollator (4 wheels);Wheelchair - manual;BSC/3in1;Shower seat;Grab bars - tub/shower;Grab bars - toilet          Prior Functioning/Environment Prior Level of Function : History of Falls (last six months)                        OT Problem List: Decreased strength;Cardiopulmonary status limiting activity;Obesity;Impaired balance (sitting and/or standing);Decreased knowledge of use of DME or AE;Decreased activity tolerance;Decreased safety awareness      OT Treatment/Interventions: Self-care/ADL training;Therapeutic exercise;Therapeutic activities;DME and/or AE instruction;Patient/family education;Balance training;Energy conservation    OT Goals(Current goals can be found in the care plan section) Acute Rehab OT Goals Patient Stated Goal: breathe better and go to rehab OT Goal Formulation: With patient/family Time For Goal Achievement: 02/03/21 Potential to Achieve Goals: Good ADL Goals Pt Will Perform Lower Body Dressing: with supervision;sit to/from stand;with adaptive equipment  OT Frequency: Min 2X/week   Barriers to D/C: Decreased caregiver support          Co-evaluation              AM-PAC OT "6 Clicks" Daily Activity     Outcome Measure Help from another person eating meals?: None Help from another person taking care of personal grooming?: None Help from another person toileting, which includes using toliet, bedpan, or urinal?: A Lot Help from another person bathing (including washing, rinsing,  drying)?: A Lot Help from another person to put on and taking off regular upper body clothing?: A Little Help from another person to put on and taking off regular lower body clothing?: A Little 6 Click Score: 18   End of Session Nurse Communication: Other (comment);Mobility status (NT educated in how to transfer later)  Activity Tolerance: Patient tolerated treatment well Patient left: in chair;with call bell/phone within reach;with chair alarm set;with family/visitor present  OT Visit Diagnosis: Other abnormalities of gait and mobility (R26.89);Muscle weakness (generalized) (M62.81)                Time: 2263-3354 OT Time Calculation (min): 29 min Charges:  OT General Charges $OT Visit: 1 Visit OT Evaluation $OT Re-eval: 1 Re-eval OT Treatments $Self Care/Home Management :  8-22 mins  Ardeth Perfect., MPH, MS, OTR/L ascom [phone] 01/20/21, 4:58 PM

## 2021-01-20 NOTE — Progress Notes (Signed)
PROGRESS NOTE  Brandi Hoover DTO:[id number] DOB: 1940-10-26 DOA: 01/06/2021 PCP: Harrel Lemon, MD  Brief History   Brandi Hoover is a 80 y.o. female with hx of asthma/COPD, diabetes mellitus type 2, hypertension, obesity who presented with complaints of shortness of breath and wheezing for the past few days.  Patient stated that her daughter has been ill with some respiratory illness.    Consultants  None  Procedures  None  Antibiotics   Anti-infectives (From admission, onward)    Start     Dose/Rate Route Frequency Ordered Stop   01/15/21 2200  doxycycline (VIBRA-TABS) tablet 100 mg  Status:  Discontinued        100 mg Oral Every 12 hours 01/15/21 1502 01/17/21 1724   01/15/21 1600  cefTRIAXone (ROCEPHIN) 1 g in sodium chloride 0.9 % 100 mL IVPB        1 g 200 mL/hr over 30 Minutes Intravenous Every 24 hours 01/15/21 1502 01/22/21 1559      Subjective  The patient is resting comfortably. She is feeling improved. Less sob, speaking in full sentences, tolerating diet  Objective   Vitals:  Vitals:   01/20/21 1146 01/20/21 1316  BP: 132/76   Pulse: 76   Resp: 16   Temp: 98 F (36.7 C)   SpO2: 94% 92%    Exam:  Constitutional:  The patient is awake, alert, and oriented x 3. No acute distress. Respiratory:  No increased work of breathing. Rales at bases, few scattered faint rhonchi No wheezes or rales Cardiovascular:  Regular rate and rhythm No murmurs, ectopy, or gallups. Abdomen:  Abdomen is soft, non-tender, non-distended No hernias, masses, or organomegaly Normoactive bowel sounds.  Musculoskeletal:  No cyanosis, clubbing, or edema Skin:  No rashes, lesions, ulcers palpation of skin: no induration or nodules Neurologic:  Moving all 4 extremiteis Psychiatric:  Mental status Mood, affect appropriate Orientation to person, place, time  judgment and insight appear intact  I have personally reviewed the following:   Today's Data  Vitals  Lab  Data  Glucoses  Micro Data  Viral respiratory panel positive for metapneumovirus  Imaging  CXR  Cardiology Data  EKG Echocardiogram  Scheduled Meds:  amLODipine  10 mg Oral Daily   ascorbic acid  250 mg Oral Daily   enoxaparin (LOVENOX) injection  40 mg Subcutaneous Q24H   fluticasone-salmeterol  1 puff Inhalation BID   guaiFENesin  1,200 mg Oral BID   insulin aspart  0-15 Units Subcutaneous TID WC   insulin aspart  0-5 Units Subcutaneous QHS   insulin aspart  3 Units Subcutaneous TID WC   insulin glargine-yfgn  24 Units Subcutaneous Daily   ipratropium-albuterol  3 mL Nebulization Q6H   mouth rinse  15 mL Mouth Rinse BID   multivitamin with minerals  1 tablet Oral Daily   polyethylene glycol  17 g Oral Daily   predniSONE  30 mg Oral Q breakfast   Ensure Max Protein  11 oz Oral QHS   simvastatin  20 mg Oral QHS    Principal Problem:   Respiratory failure with hypoxia (HCC) Active Problems:   COPD (chronic obstructive pulmonary disease) (HCC)   Anemia   Hypertension   Multinodular goiter   Stage 3b chronic kidney disease (HCC)   Type 2 diabetes mellitus with stage 3b chronic kidney disease, without long-term current use of insulin (HCC)   Acute respiratory failure with hypoxia (HCC)   LOS: 13 days    A & P  Acute hypoxemic respiratory failure 2/2 Metapneumovirus PNA --The patient has required up to 10 L O2 by high-flow [place] to maintain O2 sats in low 90's. Unfortunately o2 requirement has plateaued requiring about 6 L to maintain o2 around 90. Symptomatically much improved, though. Dimer wnl for age. Procal is low but ct shows possible superimposed pneumonia. Atelectasis likely also contributing. LE dopplers neg for DVT. PLAN:  --Continue supplemental O2 to keep sats between 88-92%, wean as tolerated. - RT following, continuing flutter valve, IS. Has tried adding metanebs - copd/cap as below as below - pulmonary now following, working on chest physiotherapy    Asthma/COPD exacerbation Community acquired pneumonia --Pt had both dx in medical records.  --presented with significant dyspnea, rhonchi and wheezes, increased sputum production, and hypoxemia.  Exacerbation triggered by viral PNA, possible superimposed bacterial. Mrsa screen neg. Strep and legionella antigens neg Plan: --cont prednisone, decreased to 30 mg daily  --schedule ipratropium and Xopenex nebs q4h --cont home Advair as Dulera - started ceftriaxone (macrolide allergy) on 12/8, doxy discontinued by pulm on 12/10 (mrsa screen neg) - may be stable for discharge tomorrow   HTN Bp wnl --cont amlodipine - holding benazepril and hctz  CKD 3A --baseline gfr low 30s, has been upper 20s here. Po is good. - hold acei and hctz as above - monitor   DM2 --pt takes metformin, glipizide and JANUVIA at home. --hold home agents --ACHS and SSI for now - cont semglee   Morbid Obesity:  BMI 42.03. Complicates all cares. Recommend sensible weight loss through diet modification and increased activity as guided by the patient's PCP as outpatient.  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 I have spent 25 minutes in her evaluation and care.   DVT prophylaxis: Lovenox SQ Code Status: Full code pt confirmed she would want intubation. Family Communication: daughter updated telephonically 12/12   Level of care: Telemetry Medical Dispo:   The patient is from: home Anticipated d/c is to: SNF Anticipated d/c date is: 1-2 more days Patient currently is not medically ready to d/c due to: severity of illness  [name], MD [facility] Direct contact: see [email or web address]  7PM-7AM contact night coverage as above

## 2021-01-21 ENCOUNTER — Inpatient Hospital Stay: Payer: Medicare Other

## 2021-01-21 DIAGNOSIS — N1831 Chronic kidney disease, stage 3a: Secondary | ICD-10-CM

## 2021-01-21 DIAGNOSIS — J45901 Unspecified asthma with (acute) exacerbation: Secondary | ICD-10-CM

## 2021-01-21 LAB — BASIC METABOLIC PANEL
Anion gap: 8 (ref 5–15)
BUN: 47 mg/dL — ABNORMAL HIGH (ref 8–23)
CO2: 28 mmol/L (ref 22–32)
Calcium: 8.4 mg/dL — ABNORMAL LOW (ref 8.9–10.3)
Chloride: 105 mmol/L (ref 98–111)
Creatinine, Ser: 1.66 mg/dL — ABNORMAL HIGH (ref 0.44–1.00)
GFR, Estimated: 31 mL/min — ABNORMAL LOW (ref >60)
Glucose, Bld: 87 mg/dL (ref 70–99)
Potassium: 4.3 mmol/L (ref 3.5–5.1)
Sodium: 141 mmol/L (ref 135–145)

## 2021-01-21 LAB — GLUCOSE, CAPILLARY
Glucose-Capillary: 78 mg/dL (ref 70–99)
Glucose-Capillary: 217 mg/dL — ABNORMAL HIGH (ref 70–99)
Glucose-Capillary: 187 mg/dL — ABNORMAL HIGH (ref 70–99)
Glucose-Capillary: 302 mg/dL — ABNORMAL HIGH (ref 70–99)

## 2021-01-21 MED ORDER — PREDNISONE 20 MG PO TABS: 20.0000 mg | ORAL_TABLET | Freq: Every day | ORAL | Status: DC

## 2021-01-21 MED ORDER — ZINC OXIDE 40 % EX OINT
TOPICAL_OINTMENT | CUTANEOUS | Status: DC | PRN
  Filled 2021-01-21: qty 113

## 2021-01-21 MED ORDER — FUROSEMIDE 10 MG/ML IJ SOLN
40.0000 mg | Freq: Once | INTRAMUSCULAR | Status: AC
  Administered 2021-01-21: 15:00:00 40 mg via INTRAVENOUS
  Filled 2021-01-21: qty 4

## 2021-01-21 NOTE — Progress Notes (Signed)
Physical Therapy Treatment Patient Details Name: Brandi Hoover MRN: [id number] DOB: May 02, 1940 Today's Date: 01/21/2021   History of Present Illness Brandi Hoover is an 73yoF who comes [facility] on 01/06[facility]21 c weakness, SOB, wheezing. PMH: asthma, DM2, HTN, obesity. Pt admitted with ARF c hypoxia. At baseline pt lives alone with 24/5 supervision/asssist from DTR, household AMB c 4WW, no falls in past 6 months.    PT Comments    Pt in chair at entry, sats 83-88% on 4L, flow rate ultimately required at 6L for sats 89-93%. Pt assisted with chair level leg and core exercises. Pt reports to feel good today. Not anxious as previously seen. Pt has been working diligently on incentive spirometer and flutter valve as well as chest exercises. Author assisted with line detangling and attachment of black tele electrode. RN made aware of IV error message.  Strength improving overall, but still limited by saturation difficulty.    Recommendations for follow up therapy are one component of a multi-disciplinary discharge planning process, led by the attending physician.  Recommendations may be updated based on patient status, additional functional criteria and insurance authorization.  Follow Up Recommendations  Skilled nursing-short term rehab (<3 hours/day)     Assistance Recommended at Discharge Frequent or constant Supervision/Assistance  Equipment Recommendations       Recommendations for Other Services       Precautions / Restrictions Precautions Precautions: Fall Precaution Comments: monitor O2 sats, HR, RR Restrictions Weight Bearing Restrictions: No     Mobility  Bed Mobility                    Transfers                        Ambulation/Gait                   Stairs             Wheelchair Mobility    Modified Rankin (Stroke Patients Only)       Balance                                            Cognition  Arousal/Alertness: Awake/alert Behavior During Therapy: WFL for tasks assessed/performed Overall Cognitive Status: Within Functional Limits for tasks assessed                                          Exercises Other Exercises Other Exercises: reclined to upright sitting, hands free 1x10 Other Exercises: seated alternating marching x20, minA for full ROM Rt Other Exercises: seated LAQ 15x3secH bilat    General Comments        Pertinent Vitals/Pain Pain Assessment: No/denies pain    Home Living                          Prior Function            PT Goals (current goals can now be found in the care plan section) Acute Rehab PT Goals Patient Stated Goal: breath better PT Goal Formulation: With patient Time For Goal Achievement: 01/22/21 Potential to Achieve Goals: Good Progress towards PT goals: Progressing toward goals    Frequency  Min 2X/week      PT Plan Current plan remains appropriate    Co-evaluation              AM-PAC PT "6 Clicks" Mobility   Outcome Measure  Help needed turning from your back to your side while in a flat bed without using bedrails?: A Little Help needed moving from lying on your back to sitting on the side of a flat bed without using bedrails?: A Little Help needed moving to and from a bed to a chair (including a wheelchair)?: A Lot Help needed standing up from a chair using your arms (e.g., wheelchair or bedside chair)?: A Lot Help needed to walk in hospital room?: A Lot Help needed climbing 3-5 steps with a railing? : Total 6 Click Score: 13    End of Session Equipment Utilized During Treatment: Oxygen Activity Tolerance: Patient tolerated treatment well;No increased pain Patient left: in chair;with call bell/phone within reach;with chair alarm set;with family/visitor present Nurse Communication: Mobility status PT Visit Diagnosis: Difficulty in walking, not elsewhere classified (R26.2);Other  abnormalities of gait and mobility (R26.89);Muscle weakness (generalized) (M62.81)     Time: 8546-2703 PT Time Calculation (min) (ACUTE ONLY): 26 min  Charges:  $Therapeutic Exercise: 23-37 mins                    4:23 PM, 01/21/21 Etta Grandchild, PT, DPT Physical Therapist - [facility]  [phone] ([place])     Letecia Arps C 01/21/2021, 4:20 PM

## 2021-01-21 NOTE — Progress Notes (Signed)
PULMONOLOGY         Date: 01/21/2021,   MRN# [id number] Brandi Hoover [birth year]     AdmissionWeight: 119.9 kg                 CurrentWeight: 119.9 kg   Referring physician: Dr. Si Raider   CHIEF COMPLAINT:   Persistent respiratory failure with hypoxemia    HISTORY OF PRESENT ILLNESS   80 yo F with hx of PMH of Asthma, DM, metabolic syndrome came in 11 days ago with flu like illness after exposure from family with similar symptoms. Patient is generally independent and denied severe symptoms or cardiac or GI or Neuro symptoms. ROS reveals RLE swelling in recent week prior to admission. She was admitted with COPD exacerbation and treated with doxycycline as well as solumedrol. She is noted to have renal impairment on admission of unknown chronicity and anemia. Renal function has improved when reviewing trend. WBC count has slightly treanded up over past 10d while on steroids and anemia is stable around 10.9. Ddimer has also treanded down quite a bit. ABG done 3d on 50%Fio2 is with severe hypoxia and elevated Aa gradient.  A DVT ultrasound study was negative bilaterally on 01/17/21. There was a CT chest done 01/14/21 I reviewed in detail. Patient is in no distress during my evaluation but in on HFNC and we reviewed medical plan together.     01/20/21- patient is improved she is less labored and reports less dyspnea. She speaks in full sentences.  I met with daughter and we reviewed patients current condition.  Plan reviewed with attending physician Dr Si Raider and patient will remain admitted for at least 24h more until respiratory status is more stable.    01/21/21- patient is improved, we reviewed CXR compared to previous study 6 days ago the left side is improved but right side has atelectasis. She wants to stay 1 more night and get stronger prior to SNF discharge.  Brandi Hoover RT was present and is agreeable to work with meta[facility] bid.  I discussed plan with daughter Brandi Hoover today.    PAST MEDICAL HISTORY   Past Medical History:  Diagnosis Date   Asthma    Diabetes mellitus without complication (HCC)    High cholesterol    Hypertension      SURGICAL HISTORY   Past Surgical History:  Procedure Laterality Date   right hip replacement       FAMILY HISTORY   Family History  Problem Relation Age of Onset   COPD Father    Stroke Mother      SOCIAL HISTORY   Social History   Tobacco Use   Smoking status: Never   Smokeless tobacco: Never  Substance Use Topics   Alcohol use: No   Drug use: No     MEDICATIONS    Home Medication:    Current Medication:  Current Facility-Administered Medications:    0.9 %  sodium chloride infusion, , Intravenous, PRN, Wouk, Ailene Rud, MD, Stopped at 01/19/21 2014   acetaminophen (TYLENOL) tablet 650 mg, 650 mg, Oral, Q6H PRN, 650 mg at 01/15/21 0900 **OR** acetaminophen (TYLENOL) suppository 650 mg, 650 mg, Rectal, Q6H PRN, Para Skeans, MD   amLODipine (NORVASC) tablet 10 mg, 10 mg, Oral, Daily, Florina Ou V, MD, 10 mg at 01/20/21 1030   ascorbic acid (VITAMIN C) tablet 250 mg, 250 mg, Oral, Daily, Florina Ou V, MD, 250 mg at 01/20/21 1030   cefTRIAXone (ROCEPHIN) 1 g in  sodium chloride 0.9 % 100 mL IVPB, 1 g, Intravenous, Q24H, Wouk, Ailene Rud, MD, Last Rate: 200 mL/hr at 01/20/21 1740, 1 g at 01/20/21 1740   enoxaparin (LOVENOX) injection 40 mg, 40 mg, Subcutaneous, Q24H, Rauer, Forde Dandy, RPH, 40 mg at 01/20/21 2155   fluticasone-salmeterol (ADVAIR) 250-50 MCG/ACT inhaler 1 puff, 1 puff, Inhalation, BID, Swayze, [name], DO, 1 puff at 01/20/21 2157   guaiFENesin (MUCINEX) 12 hr tablet 1,200 mg, 1,200 mg, Oral, BID, Wouk, Ailene Rud, MD, 1,200 mg at 01/20/21 2155   guaiFENesin (MUCINEX) 12 hr tablet 600 mg, 600 mg, Oral, BID PRN, Ottie Glazier, MD   hydrALAZINE (APRESOLINE) injection 5 mg, 5 mg, Intravenous, Q4H PRN, Para Skeans, MD, 5 mg at 01/10/21 1758   insulin aspart (novoLOG) injection 0-15  Units, 0-15 Units, Subcutaneous, TID WC, Enzo Bi, MD, 5 Units at 01/20/21 1206   insulin aspart (novoLOG) injection 0-5 Units, 0-5 Units, Subcutaneous, QHS, Enzo Bi, MD, 2 Units at 01/20/21 2155   insulin aspart (novoLOG) injection 3 Units, 3 Units, Subcutaneous, TID WC, Wouk, Ailene Rud, MD, 3 Units at 01/20/21 1206   insulin glargine-yfgn (SEMGLEE) injection 24 Units, 24 Units, Subcutaneous, Daily, Wouk, Ailene Rud, MD, 24 Units at 01/20/21 1031   ipratropium-albuterol (DUONEB) 0.5-2.5 (3) MG/3ML nebulizer solution 3 mL, 3 mL, Nebulization, Q6H, Wouk, Ailene Rud, MD, 3 mL at 01/21/21 1962   MEDLINE mouth rinse, 15 mL, Mouth Rinse, BID, Enzo Bi, MD, 15 mL at 01/20/21 2158   multivitamin with minerals tablet 1 tablet, 1 tablet, Oral, Daily, Enzo Bi, MD, 1 tablet at 01/20/21 1029   ondansetron (ZOFRAN) tablet 4 mg, 4 mg, Oral, Q6H PRN **OR** ondansetron (ZOFRAN) injection 4 mg, 4 mg, Intravenous, Q6H PRN, Posey Pronto, Gretta Cool, MD   polyethylene glycol (MIRALAX / GLYCOLAX) packet 17 g, 17 g, Oral, Daily, Wouk, Ailene Rud, MD, 17 g at 01/20/21 1029   predniSONE (DELTASONE) tablet 30 mg, 30 mg, Oral, Q breakfast, Ottie Glazier, MD, 30 mg at 01/20/21 1029   protein supplement (ENSURE MAX) liquid, 11 oz, Oral, QHS, Enzo Bi, MD, 11 oz at 01/20/21 2155   simvastatin (ZOCOR) tablet 20 mg, 20 mg, Oral, QHS, Florina Ou V, MD, 20 mg at 01/20/21 2156   sodium chloride (OCEAN) 0.65 % nasal spray 1 spray, 1 spray, Each Nare, PRN, Wouk, Ailene Rud, MD, 1 spray at 01/16/21 1242   traMADol (ULTRAM) tablet 50 mg, 50 mg, Oral, Q6H PRN, Para Skeans, MD    ALLERGIES   Mometasone furo-formoterol fum, Azithromycin, Gabapentin, Omeprazole, Other, and Ramipril     REVIEW OF SYSTEMS    Review of Systems:  Gen:  Denies  fever, sweats, chills weigh loss  HEENT: Denies blurred vision, double vision, ear pain, eye pain, hearing loss, nose bleeds, sore throat Cardiac:  No dizziness, chest pain or  heaviness, chest tightness,edema Resp:   Denies cough or sputum porduction, shortness of breath,wheezing, hemoptysis,  Gi: Denies swallowing difficulty, stomach pain, nausea or vomiting, diarrhea, constipation, bowel incontinence Gu:  Denies bladder incontinence, burning urine Ext:   Denies Joint pain, stiffness or swelling Skin: Denies  skin rash, easy bruising or bleeding or hives Endoc:  Denies polyuria, polydipsia , polyphagia or weight change Psych:   Denies depression, insomnia or hallucinations   Other:  All other systems negative   VS: BP 138/77 (BP Location: Right Arm)    Pulse 95    Temp 97.7 F (36.5 C) (Oral)    Resp (!) 22  Ht 5' 6.5" (1.689 m)    Wt 119.9 kg    SpO2 94%    BMI 42.03 kg/m      PHYSICAL EXAM    GENERAL:NAD, no fevers, chills, no weakness no fatigue HEAD: Normocephalic, atraumatic.  EYES: Pupils equal, round, reactive to light. Extraocular muscles intact. No scleral icterus.  MOUTH: Moist mucosal membrane. Dentition intact. No abscess noted.  EAR, NOSE, THROAT: Clear without exudates. No external lesions.  NECK: Supple. No thyromegaly. No nodules. No JVD.  PULMONARY: rhonchi bilaterally  CARDIOVASCULAR: S1 and S2. Regular rate and rhythm. No murmurs, rubs, or gallops. No edema. Pedal pulses 2+ bilaterally.  GASTROINTESTINAL: Soft, nontender, nondistended. No masses. Positive bowel sounds. No hepatosplenomegaly.  MUSCULOSKELETAL: No swelling, clubbing, or edema. Range of motion full in all extremities.  NEUROLOGIC: Cranial nerves II through XII are intact. No gross focal neurological deficits. Sensation intact. Reflexes intact.  SKIN: No ulceration, lesions, rashes, or cyanosis. Skin warm and dry. Turgor intact.  PSYCHIATRIC: Mood, affect within normal limits. The patient is awake, alert and oriented x 3. Insight, judgment intact.       IMAGING    DG Chest 2 View  Result Date: 01/06/2021 CLINICAL DATA:  Shortness of breath. EXAM: CHEST - 2 VIEW  COMPARISON:  Jul 04, 2019. FINDINGS: Stable cardiomediastinal silhouette. Minimal bibasilar subsegmental atelectasis is noted. Bony thorax is unremarkable. IMPRESSION: Minimal bibasilar subsegmental atelectasis. Electronically Signed   By: Marijo Conception M.D.   On: 01/06/2021 13:26   CT CHEST WO CONTRAST  Result Date: 01/14/2021 CLINICAL DATA:  Worsening hypoxia.  Evaluate for possible fusion. EXAM: CT CHEST WITHOUT CONTRAST TECHNIQUE: Multidetector CT imaging of the chest was performed following the standard protocol without IV contrast. COMPARISON:  Chest radiograph dated 01/14/2021. FINDINGS: Evaluation of this exam is limited in the absence of intravenous contrast. Cardiovascular: There is no cardiomegaly or pericardial effusion. There is coronary vascular calcification. Moderate atherosclerotic calcification of the thoracic aorta. No aneurysmal dilatation. The central pulmonary arteries are grossly unremarkable. Mediastinum/Nodes: No hilar or mediastinal adenopathy. The esophagus is grossly unremarkable. There is a 2.8 cm exophytic right posterior thyroid nodule. In the setting of significant comorbidities or limited life expectancy, no follow-up recommended (ref: J Am Coll Radiol. 2015 Feb;12(2): 143-50).No mediastinal fluid collection. Lungs/Pleura: There is an area of consolidation with air bronchogram involving the left lung base and a smaller area at the right lung base which may represent atelectasis or infiltrate. There is diffuse nodular densities involving the lower lobes consistent with pneumonia. No pleural effusion or pneumothorax. The central airways are patent. Upper Abdomen: Cholecystectomy. Probable cirrhosis. There is a 1 cm left adrenal adenoma. A 4.5 cm exophytic hypodense lesion from the upper pole of the left kidney, likely a cyst. Musculoskeletal: Osteopenia with degenerative changes of the spine. Anterior bridging osteophyte consistent with dish. No acute osseous pathology.  IMPRESSION: 1. Bilateral lower lobe pneumonia. 2. Left adrenal adenoma. 3. Aortic Atherosclerosis (ICD10-I70.0). Electronically Signed   By: Anner Crete M.D.   On: 01/14/2021 19:56   US Venous Img Lower Bilateral (DVT)  Result Date: 01/17/2021 CLINICAL DATA:  Hypoxemia EXAM: BILATERAL LOWER EXTREMITY VENOUS DOPPLER ULTRASOUND TECHNIQUE: Gray-scale sonography with graded compression, as well as color Doppler and duplex ultrasound were performed to evaluate the lower extremity deep venous systems from the level of the common femoral vein and including the common femoral, femoral, profunda femoral, popliteal and calf veins including the posterior tibial, peroneal and gastrocnemius veins when visible. The superficial great  saphenous vein was also interrogated. Spectral Doppler was utilized to evaluate flow at rest and with distal augmentation maneuvers in the common femoral, femoral and popliteal veins. COMPARISON:  CT chest, 01/14/2021. FINDINGS: RIGHT LOWER EXTREMITY VENOUS Normal compressibility of the RIGHT common femoral, superficial femoral, and popliteal veins, as well as the visualized calf veins. Visualized portions of profunda femoral vein and great saphenous vein unremarkable. No filling defects to suggest DVT on grayscale or color Doppler imaging. Doppler waveforms show normal direction of venous flow, normal respiratory plasticity and response to augmentation. OTHER No evidence of superficial thrombophlebitis or abnormal fluid collection. Limitations: none LEFT LOWER EXTREMITY VENOUS Normal compressibility of the LEFT common femoral, superficial femoral, and popliteal veins, as well as the visualized calf veins. Visualized portions of profunda femoral vein and great saphenous vein unremarkable. No filling defects to suggest DVT on grayscale or color Doppler imaging. Doppler waveforms show normal direction of venous flow, normal respiratory plasticity and response to augmentation. OTHER No evidence  of superficial thrombophlebitis or abnormal fluid collection. Limitations: none IMPRESSION: No evidence of femoropopliteal DVT within either lower extremity. Michaelle Birks, MD [facility] Radiology Electronically Signed   By: Michaelle Birks M.D.   On: 01/17/2021 16:37   DG Chest Port 1 View  Result Date: 01/14/2021 CLINICAL DATA:  Hypoxemia EXAM: PORTABLE CHEST 1 VIEW COMPARISON:  01/09/2021, 01/06/2021, 07/04/2019 FINDINGS: Suspicion of small bilateral pleural effusions. Streaky basilar opacities. Stable cardiomediastinal silhouette. No pneumothorax. IMPRESSION: Probable small pleural effusions. Streaky basilar opacities may reflect atelectasis versus mild pneumonia Electronically Signed   By: Donavan Foil M.D.   On: 01/14/2021 15:30   DG Chest Port 1 View  Result Date: 01/09/2021 CLINICAL DATA:  Dyspnea. EXAM: PORTABLE CHEST 1 VIEW COMPARISON:  January 06, 2021. FINDINGS: The heart size and mediastinal contours are within normal limits. Both lungs are clear. The visualized skeletal structures are unremarkable. IMPRESSION: No active disease. Electronically Signed   By: Marijo Conception M.D.   On: 01/09/2021 16:24   ECHOCARDIOGRAM COMPLETE  Result Date: 01/07/2021    ECHOCARDIOGRAM REPORT   Patient Name:   KAYZLEE WIRTANEN Date of Exam: 01/07/2021 Medical Rec #:  [id number]     Height:       66.5 in Accession #:    [id number]    Weight:       264.3 lb Date of Birth:  05-27-1940     BSA:          2.264 m Patient Age:    58 years      BP:           146/74 mmHg Patient Gender: F             HR:           115 bpm. Exam Location:  [facility] Procedure: 2D Echo, Color Doppler and Cardiac Doppler Indications:     I50.31 congestive heart failure-Acute Diastolic  History:         Patient has no prior history of Echocardiogram examinations.                  Risk Factors:Hypertension, Diabetes and HCL.  Sonographer:     Charmayne Sheer Referring Phys:  [id number] Gretta Cool PATEL Diagnosing  Phys: Kate Sable MD  Sonographer Comments: Suboptimal parasternal window and suboptimal subcostal window. Image acquisition challenging due to patient body habitus. IMPRESSIONS  1. Left ventricular ejection fraction, by estimation, is 55%. The left ventricle has normal  function. Left ventricular endocardial border not optimally defined to evaluate regional wall motion. Left ventricular diastolic parameters are indeterminate.  2. Right ventricular systolic function is normal. The right ventricular size is normal.  3. The mitral valve is normal in structure. No evidence of mitral valve regurgitation.  4. The aortic valve was not well visualized. Aortic valve regurgitation is not visualized.  5. The inferior vena cava is normal in size with greater than 50% respiratory variability, suggesting right atrial pressure of 3 mmHg. FINDINGS  Left Ventricle: Left ventricular ejection fraction, by estimation, is 55%. The left ventricle has normal function. Left ventricular endocardial border not optimally defined to evaluate regional wall motion. The global longitudinal strain is normal despite suboptimal segment tracking. The left ventricular internal cavity size was normal in size. There is no left ventricular hypertrophy. Left ventricular diastolic parameters are indeterminate. Right Ventricle: The right ventricular size is normal. No increase in right ventricular wall thickness. Right ventricular systolic function is normal. Left Atrium: Left atrial size was normal in size. Right Atrium: Right atrial size was not well visualized. Pericardium: There is no evidence of pericardial effusion. Mitral Valve: The mitral valve is normal in structure. No evidence of mitral valve regurgitation. MV peak gradient, 13.8 mmHg. The mean mitral valve gradient is 7.0 mmHg. Tricuspid Valve: The tricuspid valve is not well visualized. Tricuspid valve regurgitation is not demonstrated. Aortic Valve: The aortic valve was not well  visualized. Aortic valve regurgitation is not visualized. Aortic valve mean gradient measures 9.0 mmHg. Aortic valve peak gradient measures 15.7 mmHg. Aortic valve area, by VTI measures 2.39 cm. Pulmonic Valve: The pulmonic valve was normal in structure. Pulmonic valve regurgitation is not visualized. Aorta: The aortic root is normal in size and structure. Venous: The inferior vena cava is normal in size with greater than 50% respiratory variability, suggesting right atrial pressure of 3 mmHg. IAS/Shunts: No atrial level shunt detected by color flow Doppler.  LEFT VENTRICLE PLAX 2D LVOT diam:     2.10 cm      Diastology LV SV:         93           LV e' medial:    13.20 cm/s LV SV Index:   41           LV E/e' medial:  12.4 LVOT Area:     3.46 cm     LV e' lateral:   7.40 cm/s                             LV E/e' lateral: 22.2  LV Volumes (MOD) LV vol d, MOD A2C: 105.0 ml LV vol d, MOD A4C: 122.0 ml LV vol s, MOD A2C: 59.9 ml LV vol s, MOD A4C: 64.8 ml LV SV MOD A2C:     45.1 ml LV SV MOD A4C:     122.0 ml LV SV MOD BP:      62.1 ml LEFT ATRIUM           Index LA Vol (A4C): 72.4 ml 31.98 ml/m  AORTIC VALVE                     PULMONIC VALVE AV Area (Vmax):    2.48 cm      PV Vmax:       1.10 m/s AV Area (Vmean):   2.36 cm      PV Vmean:  80.500 cm/s AV Area (VTI):     2.39 cm      PV VTI:        0.229 m AV Vmax:           198.00 cm/s   PV Peak grad:  4.8 mmHg AV Vmean:          142.000 cm/s  PV Mean grad:  3.0 mmHg AV VTI:            0.390 m AV Peak Grad:      15.7 mmHg AV Mean Grad:      9.0 mmHg LVOT Vmax:         142.00 cm/s LVOT Vmean:        96.700 cm/s LVOT VTI:          0.269 m LVOT/AV VTI ratio: 0.69  AORTA Ao Root diam: 2.70 cm MITRAL VALVE MV Area (PHT): 6.27 cm     SHUNTS MV Area VTI:   3.28 cm     Systemic VTI:  0.27 m MV Peak grad:  13.8 mmHg    Systemic Diam: 2.10 cm MV Mean grad:  7.0 mmHg MV Vmax:       1.86 m/s MV Vmean:      123.0 cm/s MV Decel Time: 121 msec MV E velocity: 164.20 cm/s  MV A velocity: 142.00 cm/s MV E/A ratio:  1.16 Kate Sable MD Electronically signed by Kate Sable MD Signature Date/Time: 01/07/2021/5:24:31 PM    Final       CLINICAL DATA:  Worsening hypoxia.  Evaluate for possible fusion.   EXAM: CT CHEST WITHOUT CONTRAST   TECHNIQUE: Multidetector CT imaging of the chest was performed following the standard protocol without IV contrast.   COMPARISON:  Chest radiograph dated 01/14/2021.   FINDINGS: Evaluation of this exam is limited in the absence of intravenous contrast.   Cardiovascular: There is no cardiomegaly or pericardial effusion. There is coronary vascular calcification. Moderate atherosclerotic calcification of the thoracic aorta. No aneurysmal dilatation. The central pulmonary arteries are grossly unremarkable.   Mediastinum/Nodes: No hilar or mediastinal adenopathy. The esophagus is grossly unremarkable. There is a 2.8 cm exophytic right posterior thyroid nodule. In the setting of significant comorbidities or limited life expectancy, no follow-up recommended (ref: J Am Coll Radiol. 2015 Feb;12(2): 143-50).No mediastinal fluid collection.   Lungs/Pleura: There is an area of consolidation with air bronchogram involving the left lung base and a smaller area at the right lung base which may represent atelectasis or infiltrate. There is diffuse nodular densities involving the lower lobes consistent with pneumonia. No pleural effusion or pneumothorax. The central airways are patent.   Upper Abdomen: Cholecystectomy. Probable cirrhosis. There is a 1 cm left adrenal adenoma. A 4.5 cm exophytic hypodense lesion from the upper pole of the left kidney, likely a cyst.   Musculoskeletal: Osteopenia with degenerative changes of the spine. Anterior bridging osteophyte consistent with dish. No acute osseous pathology.   IMPRESSION: 1. Bilateral lower lobe pneumonia. 2. Left adrenal adenoma. 3. Aortic Atherosclerosis  (ICD10-I70.0).     Electronically Signed   By: Anner Crete M.D.   On: 01/14/2021 19:56  ASSESSMENT/PLAN   Acute hypoxemic respiratory failure Present on admission due to viral pneumonia secondary to metaneumovirus -patient may have developed bacterial pneumonia superimposed on viral and we will obtain Procalcitonin despite renal impairment to follow trend. There is MRSA panel negative from 12/8.  Patient has been on anibiotics empirically.  -There is consolidated infiltrate bilaterally worse on left  as evidenced above.  -patient should be encouraged to cough and expectorate debris to allow lung recruitment. Flutter valve would help with this.  -there is not much fluid in lungs or around lungs which is helpful because we dont have to diurese with CKD. -She may benefit from more aggressive bronchopulmoary hygiene -will stop doxycycline due to elevated risk of pill induced esophagitis and unlilkely helping much with MRSA negative and having rocephin IV going still.  -Will dc tessalon and tussinex since its likely hindering cough reflex and in her case coughing would help    Moderate persistent asthma with acute exacerbation  - Due to viral pneumonia with metapneumovirus -patient has been on solumedrol, agree with transition to prednisone, currently on 24m -patient is with high phlegm and infiltrates, will advance regiment with Mucomyst 413mBID 20% -continue Duoneb as ordered    Bibasilar atelectasis     - patient with incentive spirometry      - encourage to use flutter valve      - encourage participation with PT patient was able to stand take few shuffling steps from bed to chair only.     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  Total face to face encounter time for this patient visit was >45 min. >50% of the time was  spent in counseling and coordination of  care.   Patient/Family are satisfied with care plan and all questions have been answered.  This document was prepared using Dragon voice recognition software and may include unintentional dictation errors.     FuOttie GlazierM.D.  Division of [place]

## 2021-01-21 NOTE — TOC Progression Note (Signed)
Transition of Care [facility]) - Progression Note    Patient Details  Name: VIKKIE GOEDEN MRN: [id number] Date of Birth: [birth year]  Transition of Care [facility]) CM/SW [place], RN Phone Number: 01/21/2021, 4:14 PM  Clinical Narrative:   Patient continued to have medical workup for oxygen.  Rickey from [facility] notified yesterday of patient's continuing need to assess oxygenation.  TOC to follow    Expected Discharge Plan:  (Home health vs snf) Barriers to Discharge: Continued Medical Work up  Expected Discharge Plan and Services Expected Discharge Plan:  (Home health vs snf)   Discharge Planning Services: CM Consult   Living arrangements for the past 2 months: Single Family Home                                       Social Determinants of Health (SDOH) Interventions    Readmission Risk Interventions No flowsheet data found.

## 2021-01-21 NOTE — Progress Notes (Signed)
Nutrition Follow-up  DOCUMENTATION CODES:   Morbid obesity  INTERVENTION:   -Continue MVI with minerals daily -Continue Ensure Max po daily, each supplement provides 150 kcal and 30 grams of protein.    NUTRITION DIAGNOSIS:   Increased nutrient needs related to chronic illness (COPD) as evidenced by estimated needs.  Ongoing  GOAL:   Patient will meet greater than or equal to 90% of their needs  Progressing   MONITOR:   PO intake, Supplement acceptance, Labs, Weight trends, Skin, I & O's  REASON FOR ASSESSMENT:   Consult Assessment of nutrition requirement/status  ASSESSMENT:   Brandi Hoover is a 80 y.o. female seen in ed with complaints of shortness of breath and wheezing for the past few days.  Patient states that her daughter has been ill with the flu and she has been exposed.  She denies any fevers chills chest pain palpitations headaches blurred vision speech or gait issues at baseline she states that she can keep her house clean and take care of herself her kitchen her home.  But right now she cannot.  Patient also denies any fevers chills abdominal pain nausea vomiting diarrhea urinary bladder issues skin or joint issues.  Patient states that she does note in addition to the shortness of breath gets worse when she is walking or when she is laying down and she prefers to sleep sitting up.  Patient reports that her right leg has been swelling for an unknown duration but usually does not get swelling in her legs.  And at the end of encounter patient reports chest discomfort that is with shortness of breath and on exertion.  Reviewed I/O's: -225 ml x 24 hours and -6.9 L since admission  UOP: 225 ml x 24 hours   Pt's respiratory status is improving and may be stable for discharge today. Pt no longer requiring HFNC and is on supplemental oxygen.   Pt unavailable at time of visit. Attempted to speak with pt via call to hospital room phone, however, unable to reach.   No  meal completion data currently available. Pt is taking Ensure supplements.   No wt has been taken since admission.   Per TOC notes, pt awaiting medical stability for discharge to SNF.   Medications reviewed and include miralax and prednisone.   Labs reviewed: CBGS: 78-242 (inpatient orders for glycemic control are 0-15 units insulin aspart TID with meal,s 0-5 units insulin aspart daily at bedtime, 3 units insulin aspart TID with meals and 24 units insulin glargine-yfgn daily).    Diet Order:   Diet Order             Diet Carb Modified Fluid consistency: Thin; Room service appropriate? Yes  Diet effective now                   EDUCATION NEEDS:   No education needs have been identified at this time  Skin:  Skin Assessment: Reviewed RN Assessment  Last BM:  01/20/21  Height:   Ht Readings from Last 1 Encounters:  01/06/21 5' 6.5" (1.689 m)    Weight:   Wt Readings from Last 1 Encounters:  01/06/21 119.9 kg    Ideal Body Weight:  60.2 kg  BMI:  Body mass index is 42.03 kg/m.  Estimated Nutritional Needs:   Kcal:  1900-2100  Protein:  105-120 grams  Fluid:  > 1.9 L    Loistine Chance, RD, LDN, [place] Registered Dietitian II Certified Diabetes Care and Education Specialist Please  refer to [facility] for RD and/or RD on-call/weekend/after hours pager

## 2021-01-21 NOTE — Progress Notes (Signed)
PROGRESS NOTE    Brandi Hoover  GYF:[id number] DOB: [birth year] DOA: 01/06/2021 PCP: Harrel Lemon, MD    Assessment & Plan:   Principal Problem:   Respiratory failure with hypoxia ([place]) Active Problems:   COPD (chronic obstructive pulmonary disease) ([place])   Anemia   Hypertension   Multinodular goiter   Stage 3b chronic kidney disease ([place])   Type 2 diabetes mellitus with stage 3b chronic kidney disease, without long-term current use of insulin (HCC)   Acute respiratory failure with hypoxia (HCC)   Acute hypoxic respiratory failure: secondary to metapneumovirus pneumonia. Possible superimposed bacterial pneumonia. Continue on supplemental oxygen and wean as tolerated. Continue on IV rocephin, bronchodilators and steroids. Repeat CXR shows atelectasis/pneumonitis w/ interval worsening. Pulmon following and recs apprec    Asthma exacerbation: moderate persistent. Continue on steroids, bronchodilators and encourage incentive spirometry. Pulmon following and recs apprec   HTN: continue on home dose of amlodipine. 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 Avoid nephrotoxic meds   DM2: likely poorly controlled. Continue on glargine, SSI w/ accuchecks   Morbid Obesity: BMI 42.0. 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 Will f/u outpatient w/ PCP     DVT prophylaxis: lovenox  Code Status: full  Family Communication: Disposition Plan: likely d/c to SNF   Level of care: Telemetry Medical  Status is: Inpatient  Remains inpatient appropriate because: severity of illness, on 4L [place] currently. Waiting on SNF      Consultants:  Pulmon   Procedures:   Antimicrobials: rocephin    Subjective: Pt c/o intermittent productive cough   Objective: Vitals:   01/21/21 0045 01/21/21 0438 01/21/21 0822 01/21/21 1132  BP: (!) 149/67 138/77 (!) 183/57 (!) 165/70  Pulse: 67 95 (!) 104 79   Resp: 20 (!) 22 20 18   Temp: 97.7 F (36.5 C) 97.7 F (36.5 C) 98.8 F (37.1 C) 97.7 F (36.5 C)  TempSrc: Oral Oral Oral Oral  SpO2: 94% 94% 94% 93%  Weight:      Height:       No intake or output data in the 24 hours ending 01/21/21 1346 Filed Weights   01/06/21 1205  Weight: 119.9 kg    Examination:  General exam: Appears calm and comfortable  Respiratory system: diminished breath sounds b/l  Cardiovascular system: S1 & S2 +. No rubs, gallops or clicks.  Gastrointestinal system: Abdomen is nondistended, soft and nontender. Normal bowel sounds heard. Central nervous system: Alert and oriented. Moves all extremities  Psychiatry: Judgement and insight appear normal. Flat mood and affect     Data Reviewed: I have personally reviewed following labs and imaging studies  CBC: Recent Labs  Lab 01/15/21 0539 01/18/21 0614  WBC 17.9* 13.2*  HGB 10.9* 9.6*  HCT 35.0* 31.1*  MCV 94.9 94.2  PLT 287 916   Basic Metabolic Panel: Recent Labs  Lab 01/16/21 0358 01/17/21 0700 01/18/21 0614 01/20/21 0613 01/21/21 0629  NA 141 142 139 139 141  K 4.1 3.9 4.0 4.6 4.3  CL 102 103 101 104 105  CO2 30 30 29 29 28   GLUCOSE 155* 91 134* 106* 87  BUN 53* 53* 50* 55* 47*  CREATININE 1.99* 1.85* 1.80* 1.71* 1.66*  CALCIUM 8.1* 8.1* 8.1* 8.3* 8.4*   GFR: Estimated Creatinine Clearance: 34.8 mL/min (A) (by C-G formula based on SCr of 1.66 mg/dL (H)). Liver Function Tests: No results for input(s): AST, ALT,  ALKPHOS, BILITOT, PROT, ALBUMIN in the last 168 hours. No results for input(s): LIPASE, AMYLASE in the last 168 hours. No results for input(s): AMMONIA in the last 168 hours. Coagulation Profile: No results for input(s): INR, PROTIME in the last 168 hours. Cardiac Enzymes: No results for input(s): CKTOTAL, CKMB, CKMBINDEX, TROPONINI in the last 168 hours. BNP (last 3 results) No results for input(s): PROBNP in the last 8760 hours. HbA1C: No results for input(s): HGBA1C  in the last 72 hours. CBG: Recent Labs  Lab 01/20/21 1143 01/20/21 1628 01/20/21 2107 01/21/21 0821 01/21/21 1130  GLUCAP 201* 99 242* 78 217*   Lipid Profile: No results for input(s): CHOL, HDL, LDLCALC, TRIG, CHOLHDL, LDLDIRECT in the last 72 hours. Thyroid Function Tests: No results for input(s): TSH, T4TOTAL, FREET4, T3FREE, THYROIDAB in the last 72 hours. Anemia Panel: No results for input(s): VITAMINB12, FOLATE, FERRITIN, TIBC, IRON, RETICCTPCT in the last 72 hours. Sepsis Labs: Recent Labs  Lab 01/17/21 0700  PROCALCITON 0.24    Recent Results (from the past 240 hour(s))  Expectorated Sputum Assessment w Gram Stain, Rflx to Resp Cult     Status: None   Collection Time: 01/15/21 12:20 PM   Specimen: Expectorated Sputum  Result Value Ref Range Status   Specimen Description EXPECTORATED SPUTUM  Final   Special Requests NONE  Final   Sputum evaluation   Final    Sputum specimen not acceptable for testing.  Please recollect.   TIFFANY ROBBINSON 01/16/21 1325 MW Performed at [facility], [address], [place], [place] [address]    Report Status 01/16/2021 FINAL  Final  MRSA Next Gen by PCR, Nasal     Status: None   Collection Time: 01/15/21  4:55 PM   Specimen: Nasal Mucosa; Nasal Swab  Result Value Ref Range Status   MRSA by PCR Next Gen NOT DETECTED NOT DETECTED Final    Comment: (NOTE) The GeneXpert MRSA Assay (FDA approved for NASAL specimens only), is one component of a comprehensive MRSA colonization surveillance program. It is not intended to diagnose MRSA infection nor to guide or monitor treatment for MRSA infections. Test performance is not FDA approved in patients less than 39 years old. Performed at [facility], [address] [place], [place] [address]          Radiology Studies: DG Chest [place] 1 View  Result Date: 01/21/2021 CLINICAL DATA:  Shortness of breath EXAM: PORTABLE CHEST 1 VIEW COMPARISON:  01/14/2021  FINDINGS: Transverse diameter of heart is increased. There are no signs of pulmonary edema. There are patchy infiltrates in both lower lung fields with interval worsening. Upper lung fields are essentially clear. There is blunting of lateral CP angles. There is no pneumothorax. IMPRESSION: Cardiomegaly. There are linear densities in the lower lung fields suggesting subsegmental atelectasis/pneumonitis with interval worsening. Small bilateral pleural effusions. Electronically Signed   By: Elmer Picker M.D.   On: 01/21/2021 13:14        Scheduled Meds:  amLODipine  10 mg Oral Daily   ascorbic acid  250 mg Oral Daily   enoxaparin (LOVENOX) injection  40 mg Subcutaneous Q24H   fluticasone-salmeterol  1 puff Inhalation BID   guaiFENesin  1,200 mg Oral BID   insulin aspart  0-15 Units Subcutaneous TID WC   insulin aspart  0-5 Units Subcutaneous QHS   insulin aspart  3 Units Subcutaneous TID WC   insulin glargine-yfgn  24 Units Subcutaneous Daily   ipratropium-albuterol  3 mL Nebulization Q6H  mouth rinse  15 mL Mouth Rinse BID   multivitamin with minerals  1 tablet Oral Daily   polyethylene glycol  17 g Oral Daily   predniSONE  30 mg Oral Q breakfast   Ensure Max Protein  11 oz Oral QHS   simvastatin  20 mg Oral QHS   Continuous Infusions:  sodium chloride Stopped (01/19/21 2014)   cefTRIAXone (ROCEPHIN)  IV 1 g (01/20/21 1740)     LOS: 14 days    Time spent: 30 mins     Wyvonnia Dusky, MD [facility] Pager [phone]  If 7PM-7AM, please contact night-coverage 01/21/2021, 1:46 PM

## 2021-01-21 NOTE — Plan of Care (Signed)
  Problem: Clinical Measurements: Goal: Ability to maintain clinical measurements within normal limits will improve Outcome: Progressing   Problem: Clinical Measurements: Goal: Will remain free from infection Outcome: Progressing   Problem: Clinical Measurements: Goal: Diagnostic test results will improve Outcome: Progressing   

## 2021-01-22 ENCOUNTER — Inpatient Hospital Stay: Payer: Medicare Other

## 2021-01-22 LAB — CBC
HCT: 31.6 % — ABNORMAL LOW (ref 36.0–46.0)
Hemoglobin: 9.9 g/dL — ABNORMAL LOW (ref 12.0–15.0)
MCH: 29 pg (ref 26.0–34.0)
MCHC: 31.3 g/dL (ref 30.0–36.0)
MCV: 92.7 fL (ref 80.0–100.0)
Platelets: 272 10*3/uL (ref 150–400)
RBC: 3.41 MIL/uL — ABNORMAL LOW (ref 3.87–5.11)
RDW: 13.1 % (ref 11.5–15.5)
WBC: 10.7 10*3/uL — ABNORMAL HIGH (ref 4.0–10.5)
nRBC: 0 % (ref 0.0–0.2)

## 2021-01-22 LAB — GLUCOSE, CAPILLARY
Glucose-Capillary: 118 mg/dL — ABNORMAL HIGH (ref 70–99)
Glucose-Capillary: 118 mg/dL — ABNORMAL HIGH (ref 70–99)
Glucose-Capillary: 224 mg/dL — ABNORMAL HIGH (ref 70–99)
Glucose-Capillary: 53 mg/dL — ABNORMAL LOW (ref 70–99)
Glucose-Capillary: 85 mg/dL (ref 70–99)

## 2021-01-22 LAB — BASIC METABOLIC PANEL
Anion gap: 7 (ref 5–15)
BUN: 51 mg/dL — ABNORMAL HIGH (ref 8–23)
CO2: 30 mmol/L (ref 22–32)
Calcium: 8.4 mg/dL — ABNORMAL LOW (ref 8.9–10.3)
Chloride: 104 mmol/L (ref 98–111)
Creatinine, Ser: 1.95 mg/dL — ABNORMAL HIGH (ref 0.44–1.00)
GFR, Estimated: 25 mL/min — ABNORMAL LOW (ref >60)
Glucose, Bld: 136 mg/dL — ABNORMAL HIGH (ref 70–99)
Potassium: 4.4 mmol/L (ref 3.5–5.1)
Sodium: 141 mmol/L (ref 135–145)

## 2021-01-22 MED ORDER — FUROSEMIDE 10 MG/ML IJ SOLN
40.0000 mg | Freq: Once | INTRAMUSCULAR | Status: AC
  Administered 2021-01-22: 09:00:00 40 mg via INTRAVENOUS
  Filled 2021-01-22: qty 4

## 2021-01-22 MED ORDER — PREDNISONE 10 MG PO TABS
10.0000 mg | ORAL_TABLET | Freq: Every day | ORAL | Status: DC
  Administered 2021-01-22 – 2021-01-23 (×2): 10 mg via ORAL
  Filled 2021-01-22 (×2): qty 1

## 2021-01-22 NOTE — Progress Notes (Signed)
CBG 53. Given 1 juice, CBG 85 on recheck.

## 2021-01-22 NOTE — Progress Notes (Signed)
PROGRESS NOTE    Brandi Hoover  HAL:[id number] DOB: September 25, 1940 DOA: 01/06/2021 PCP: Harrel Lemon, MD    Assessment & Plan:   Principal Problem:   Respiratory failure with hypoxia ([place]) Active Problems:   COPD (chronic obstructive pulmonary disease) ([place])   Anemia   Hypertension   Multinodular goiter   Stage 3b chronic kidney disease ([place])   Type 2 diabetes mellitus with stage 3b chronic kidney disease, without long-term current use of insulin (HCC)   Acute respiratory failure with hypoxia (HCC)   Acute hypoxic respiratory failure: secondary to metapneumovirus pneumonia. Possible superimposed bacterial pneumonia. Continue on supplemental oxygen and wean as tolerated. Desaturates when trying to wean down oxygen. Completed abx course. Continue on steroids and bronchodilators. Pulmon following and recs apprec    Asthma exacerbation: moderate persistent. Continue on steroids, bronchodilators and encourage incentive spirometry. Pulmon following and recs apprec   HTN: continue on home dose of CCB. Hold benazepril, HCTZ  CKDIIIa: Cr is labile. Avoid nephrotoxic meds   DM2: fairly well controlled, HbA1c 7.1. Continue on glargine, SSI w/ accuchecks   Morbid Obesity: BMI 42.0. Complicates overall care & prognosis   Generalized weakness: PT/OT recs SNF    Adrenal adenoma: incidental finding on CT. Will f/u outpatient w/ PCP     DVT prophylaxis: lovenox  Code Status: full  Family Communication: discussed pt's care w/ pt's daughter, Venida Jarvis, and answered her questions Disposition Plan: likely d/c to SNF   Level of care: Telemetry Medical  Status is: Inpatient  Remains inpatient appropriate because: severity of illness, on 5L [place] w/ intermittent desaturates when trying to wean down oxygen      Consultants:  Pulmon   Procedures:   Antimicrobials:    Subjective: Pt denies any complaints   Objective: Vitals:   01/22/21 0053 01/22/21 0152 01/22/21 0505 01/22/21 0718   BP: (!) 152/65  (!) 150/67 (!) 161/55  Pulse: 67  77 77  Resp: 19  18 16   Temp: 97.6 F (36.4 C)  98.3 F (36.8 C) 99 F (37.2 C)  TempSrc: Oral  Oral Oral  SpO2: 94% 95% 95% 97%  Weight:      Height:        Intake/Output Summary (Last 24 hours) at 01/22/2021 0750 Last data filed at 01/22/2021 0507 Gross per 24 hour  Intake --  Output 1050 ml  Net -1050 ml   Filed Weights   01/06/21 1205  Weight: 119.9 kg    Examination:  General exam: Appears comfortable  Respiratory system: decreased breath sounds b/l Cardiovascular system: S1/S2+. No gallops or clicks  Gastrointestinal system: Abd is soft, NT, ND & normal bowel sounds  Central nervous system:alert and oriented. Moves all extremities  Psychiatry: Judgement and insight appears normal. Flat mood and affect     Data Reviewed: I have personally reviewed following labs and imaging studies  CBC: Recent Labs  Lab 01/18/21 0614 01/22/21 0610  WBC 13.2* 10.7*  HGB 9.6* 9.9*  HCT 31.1* 31.6*  MCV 94.2 92.7  PLT 264 735   Basic Metabolic Panel: Recent Labs  Lab 01/17/21 0700 01/18/21 0614 01/20/21 0613 01/21/21 0629 01/22/21 0610  NA 142 139 139 141 141  K 3.9 4.0 4.6 4.3 4.4  CL 103 101 104 105 104  CO2 30 29 29 28 30   GLUCOSE 91 134* 106* 87 136*  BUN 53* 50* 55* 47* 51*  CREATININE 1.85* 1.80* 1.71* 1.66* 1.95*  CALCIUM 8.1* 8.1* 8.3* 8.4* 8.4*   GFR: Estimated  Creatinine Clearance: 29.6 mL/min (A) (by C-G formula based on SCr of 1.95 mg/dL (H)). Liver Function Tests: No results for input(s): AST, ALT, ALKPHOS, BILITOT, PROT, ALBUMIN in the last 168 hours. No results for input(s): LIPASE, AMYLASE in the last 168 hours. No results for input(s): AMMONIA in the last 168 hours. Coagulation Profile: No results for input(s): INR, PROTIME in the last 168 hours. Cardiac Enzymes: No results for input(s): CKTOTAL, CKMB, CKMBINDEX, TROPONINI in the last 168 hours. BNP (last 3 results) No results for  input(s): PROBNP in the last 8760 hours. HbA1C: No results for input(s): HGBA1C in the last 72 hours. CBG: Recent Labs  Lab 01/20/21 2107 01/21/21 0821 01/21/21 1130 01/21/21 1615 01/21/21 2133  GLUCAP 242* 78 217* 187* 302*   Lipid Profile: No results for input(s): CHOL, HDL, LDLCALC, TRIG, CHOLHDL, LDLDIRECT in the last 72 hours. Thyroid Function Tests: No results for input(s): TSH, T4TOTAL, FREET4, T3FREE, THYROIDAB in the last 72 hours. Anemia Panel: No results for input(s): VITAMINB12, FOLATE, FERRITIN, TIBC, IRON, RETICCTPCT in the last 72 hours. Sepsis Labs: Recent Labs  Lab 01/17/21 0700  PROCALCITON 0.24    Recent Results (from the past 240 hour(s))  Expectorated Sputum Assessment w Gram Stain, Rflx to Resp Cult     Status: None   Collection Time: 01/15/21 12:20 PM   Specimen: Expectorated Sputum  Result Value Ref Range Status   Specimen Description EXPECTORATED SPUTUM  Final   Special Requests NONE  Final   Sputum evaluation   Final    Sputum specimen not acceptable for testing.  Please recollect.   TIFFANY ROBBINSON 01/16/21 1325 MW Performed at [facility], [address] [place], [place] [address]    Report Status 01/16/2021 FINAL  Final  MRSA Next Gen by PCR, Nasal     Status: None   Collection Time: 01/15/21  4:55 PM   Specimen: Nasal Mucosa; Nasal Swab  Result Value Ref Range Status   MRSA by PCR Next Gen NOT DETECTED NOT DETECTED Final    Comment: (NOTE) The GeneXpert MRSA Assay (FDA approved for NASAL specimens only), is one component of a comprehensive MRSA colonization surveillance program. It is not intended to diagnose MRSA infection nor to guide or monitor treatment for MRSA infections. Test performance is not FDA approved in patients less than 12 years old. Performed at [facility], [address], [place], [place] [address]          Radiology Studies: DG Chest [place] 1 View  Result Date: 01/21/2021 CLINICAL  DATA:  Shortness of breath EXAM: PORTABLE CHEST 1 VIEW COMPARISON:  01/14/2021 FINDINGS: Transverse diameter of heart is increased. There are no signs of pulmonary edema. There are patchy infiltrates in both lower lung fields with interval worsening. Upper lung fields are essentially clear. There is blunting of lateral CP angles. There is no pneumothorax. IMPRESSION: Cardiomegaly. There are linear densities in the lower lung fields suggesting subsegmental atelectasis/pneumonitis with interval worsening. Small bilateral pleural effusions. Electronically Signed   By: Elmer Picker M.D.   On: 01/21/2021 13:14        Scheduled Meds:  ascorbic acid  250 mg Oral Daily   enoxaparin (LOVENOX) injection  40 mg Subcutaneous Q24H   fluticasone-salmeterol  1 puff Inhalation BID   furosemide  40 mg Intravenous Once   guaiFENesin  1,200 mg Oral BID   insulin aspart  0-15 Units Subcutaneous TID WC   insulin aspart  0-5 Units Subcutaneous QHS   insulin  aspart  3 Units Subcutaneous TID WC   insulin glargine-yfgn  24 Units Subcutaneous Daily   ipratropium-albuterol  3 mL Nebulization Q6H   mouth rinse  15 mL Mouth Rinse BID   multivitamin with minerals  1 tablet Oral Daily   polyethylene glycol  17 g Oral Daily   predniSONE  10 mg Oral Q breakfast   Ensure Max Protein  11 oz Oral QHS   simvastatin  20 mg Oral QHS   Continuous Infusions:  sodium chloride Stopped (01/19/21 2014)     LOS: 15 days    Time spent: 15 mins     Wyvonnia Dusky, MD [facility] Pager [phone]  If 7PM-7AM, please contact night-coverage 01/22/2021, 7:50 AM

## 2021-01-22 NOTE — Progress Notes (Signed)
OT Cancellation Note  Patient Details Name: Brandi Hoover MRN: [id number] DOB: November 15, 1940   Cancelled Treatment:    Reason Eval/Treat Not Completed: Other (comment). Pt receiving care from nursing upon attempt. Will re-attempt OT tx at later date/time as pt is available.   Ardeth Perfect., MPH, MS, OTR/L ascom [phone] 01/22/21, 2:25 PM

## 2021-01-22 NOTE — TOC Progression Note (Addendum)
Transition of Care [facility]) - Progression Note    Patient Details  Name: Brandi Hoover MRN: [id number] Date of Birth: 09-13-1940  Transition of [facility]) CM/SW [place], RN Phone Number: 01/22/2021, 2:47 PM  Clinical Narrative:   Patient is scheduled to discharge to [facility] when medically ready for discharge.  Care team is continuing to care for patient to maintain appropriate oxygen saturation levels.  With adjustment, patient continues to desaturate below 90% and thus requires and increase in oxygen.   Update given to [place] at [employer].  TOC contact information provided to patient's family and SNF, tOC will continue to follow.  Addendum:  8:  RNCM spoke to Parker at [facility], and he confirmed that facility will not be able to accept patients on more than 4L of oxygen.  Message disseminated to care team.  [facility] will keep Ms. Kil[place]ase on their admit list and RNCM will continue to update family and facility as needed.  Expected Discharge Plan:  (Home health vs snf) Barriers to Discharge: Continued Medical Work up  Expected Discharge Plan and Services Expected Discharge Plan:  (Home health vs snf)   Discharge Planning Services: CM Consult   Living arrangements for the past 2 months: Single Family Home                                       Social Determinants of Health (SDOH) Interventions    Readmission Risk Interventions No flowsheet data found.

## 2021-01-22 NOTE — Progress Notes (Signed)
PULMONOLOGY         Date: 01/22/2021,   MRN# [id number] Brandi Hoover 31-Oct-1940     AdmissionWeight: 119.9 kg                 CurrentWeight: 119.9 kg   Referring physician: Dr. Si Raider   CHIEF COMPLAINT:   Persistent respiratory failure with hypoxemia    HISTORY OF PRESENT ILLNESS   80 yo F with hx of PMH of Asthma, DM, metabolic syndrome came in 11 days ago with flu like illness after exposure from family with similar symptoms. Patient is generally independent and denied severe symptoms or cardiac or GI or Neuro symptoms. ROS reveals RLE swelling in recent week prior to admission. She was admitted with COPD exacerbation and treated with doxycycline as well as solumedrol. She is noted to have renal impairment on admission of unknown chronicity and anemia. Renal function has improved when reviewing trend. WBC count has slightly treanded up over past 10d while on steroids and anemia is stable around 10.9. Ddimer has also treanded down quite a bit. ABG done 3d on 50%Fio2 is with severe hypoxia and elevated Aa gradient.  A DVT ultrasound study was negative bilaterally on 01/17/21. There was a CT chest done 01/14/21 I reviewed in detail. Patient is in no distress during my evaluation but in on HFNC and we reviewed medical plan together.   01/22/21- patient diuresed further overnight, delivered extra dose lasix 40 this am due to finding of mild pleural effusions forming on yesterday cxr. Holding Amlodipine this am and will repeat CXR at 1400.  Still can continue with dc planning.  Prednisone reduced to 10mg  daily will complete tommorow. Patient saturating 95% on 5L/min may be able to wean down.   PAST MEDICAL HISTORY   Past Medical History:  Diagnosis Date   Asthma    Diabetes mellitus without complication ([place])    High cholesterol    Hypertension      SURGICAL HISTORY   Past Surgical History:  Procedure Laterality Date   right hip replacement       FAMILY HISTORY    Family History  Problem Relation Age of Onset   COPD Father    Stroke Mother      SOCIAL HISTORY   Social History   Tobacco Use   Smoking status: Never   Smokeless tobacco: Never  Substance Use Topics   Alcohol use: No   Drug use: No     MEDICATIONS    Home Medication:    Current Medication:  Current Facility-Administered Medications:    0.9 %  sodium chloride infusion, , Intravenous, PRN, Wouk, Ailene Rud, MD, Stopped at 01/19/21 2014   acetaminophen (TYLENOL) tablet 650 mg, 650 mg, Oral, Q6H PRN, 650 mg at 01/15/21 0900 **OR** acetaminophen (TYLENOL) suppository 650 mg, 650 mg, Rectal, Q6H PRN, Para Skeans, MD   ascorbic acid (VITAMIN C) tablet 250 mg, 250 mg, Oral, Daily, Florina Ou V, MD, 250 mg at 01/21/21 1037   enoxaparin (LOVENOX) injection 40 mg, 40 mg, Subcutaneous, Q24H, Rauer, Forde Dandy, RPH, 40 mg at 01/21/21 2108   fluticasone-salmeterol (ADVAIR) 250-50 MCG/ACT inhaler 1 puff, 1 puff, Inhalation, BID, Swayze, [name], DO, 1 puff at 01/21/21 2106   furosemide (LASIX) injection 40 mg, 40 mg, Intravenous, Once, Kassidi Elza, MD   guaiFENesin (MUCINEX) 12 hr tablet 1,200 mg, 1,200 mg, Oral, BID, Wouk, Ailene Rud, MD, 1,200 mg at 01/21/21 2107   guaiFENesin (MUCINEX) 12 hr tablet  600 mg, 600 mg, Oral, BID PRN, Ottie Glazier, MD   hydrALAZINE (APRESOLINE) injection 5 mg, 5 mg, Intravenous, Q4H PRN, Para Skeans, MD, 5 mg at 01/10/21 1758   insulin aspart (novoLOG) injection 0-15 Units, 0-15 Units, Subcutaneous, TID WC, Enzo Bi, MD, 5 Units at 01/21/21 1237   insulin aspart (novoLOG) injection 0-5 Units, 0-5 Units, Subcutaneous, QHS, Enzo Bi, MD, 4 Units at 01/21/21 2211   insulin aspart (novoLOG) injection 3 Units, 3 Units, Subcutaneous, TID WC, Wouk, Ailene Rud, MD, 3 Units at 01/21/21 1725   insulin glargine-yfgn (SEMGLEE) injection 24 Units, 24 Units, Subcutaneous, Daily, Wouk, Ailene Rud, MD, 24 Units at 01/21/21 1039   ipratropium-albuterol  (DUONEB) 0.5-2.5 (3) MG/3ML nebulizer solution 3 mL, 3 mL, Nebulization, Q6H, Wouk, Ailene Rud, MD, 3 mL at 01/22/21 0152   liver oil-zinc oxide (DESITIN) 40 % ointment, , Topical, PRN, Wyvonnia Dusky, MD   MEDLINE mouth rinse, 15 mL, Mouth Rinse, BID, Enzo Bi, MD, 15 mL at 01/21/21 1039   multivitamin with minerals tablet 1 tablet, 1 tablet, Oral, Daily, Enzo Bi, MD, 1 tablet at 01/21/21 1036   ondansetron (ZOFRAN) tablet 4 mg, 4 mg, Oral, Q6H PRN **OR** ondansetron (ZOFRAN) injection 4 mg, 4 mg, Intravenous, Q6H PRN, Posey Pronto, Gretta Cool, MD   polyethylene glycol (MIRALAX / GLYCOLAX) packet 17 g, 17 g, Oral, Daily, Wouk, Ailene Rud, MD, 17 g at 01/21/21 1039   predniSONE (DELTASONE) tablet 10 mg, 10 mg, Oral, Q breakfast, Ottie Glazier, MD   protein supplement (ENSURE MAX) liquid, 11 oz, Oral, QHS, Enzo Bi, MD, 11 oz at 01/20/21 2155   simvastatin (ZOCOR) tablet 20 mg, 20 mg, Oral, QHS, Florina Ou V, MD, 20 mg at 01/21/21 2108   sodium chloride (OCEAN) 0.65 % nasal spray 1 spray, 1 spray, Each Nare, PRN, Wouk, Ailene Rud, MD, 1 spray at 01/16/21 1242   traMADol (ULTRAM) tablet 50 mg, 50 mg, Oral, Q6H PRN, Para Skeans, MD    ALLERGIES   Mometasone furo-formoterol fum, Azithromycin, Gabapentin, Omeprazole, Other, and Ramipril     REVIEW OF SYSTEMS    Review of Systems:  Gen:  Denies  fever, sweats, chills weigh loss  HEENT: Denies blurred vision, double vision, ear pain, eye pain, hearing loss, nose bleeds, sore throat Cardiac:  No dizziness, chest pain or heaviness, chest tightness,edema Resp:   Denies cough or sputum porduction, shortness of breath,wheezing, hemoptysis,  Gi: Denies swallowing difficulty, stomach pain, nausea or vomiting, diarrhea, constipation, bowel incontinence Gu:  Denies bladder incontinence, burning urine Ext:   Denies Joint pain, stiffness or swelling Skin: Denies  skin rash, easy bruising or bleeding or hives Endoc:  Denies polyuria,  polydipsia , polyphagia or weight change Psych:   Denies depression, insomnia or hallucinations   Other:  All other systems negative   VS: BP (!) 161/55 (BP Location: Right Arm)    Pulse 77    Temp 99 F (37.2 C) (Oral)    Resp 16    Ht 5' 6.5" (1.689 m)    Wt 119.9 kg    SpO2 97%    BMI 42.03 kg/m      PHYSICAL EXAM    GENERAL:NAD, no fevers, chills, no weakness no fatigue HEAD: Normocephalic, atraumatic.  EYES: Pupils equal, round, reactive to light. Extraocular muscles intact. No scleral icterus.  MOUTH: Moist mucosal membrane. Dentition intact. No abscess noted.  EAR, NOSE, THROAT: Clear without exudates. No external lesions.  NECK: Supple. No thyromegaly. No nodules.  No JVD.  PULMONARY: rhonchi bilaterally  CARDIOVASCULAR: S1 and S2. Regular rate and rhythm. No murmurs, rubs, or gallops. No edema. Pedal pulses 2+ bilaterally.  GASTROINTESTINAL: Soft, nontender, nondistended. No masses. Positive bowel sounds. No hepatosplenomegaly.  MUSCULOSKELETAL: No swelling, clubbing, or edema. Range of motion full in all extremities.  NEUROLOGIC: Cranial nerves II through XII are intact. No gross focal neurological deficits. Sensation intact. Reflexes intact.  SKIN: No ulceration, lesions, rashes, or cyanosis. Skin warm and dry. Turgor intact.  PSYCHIATRIC: Mood, affect within normal limits. The patient is awake, alert and oriented x 3. Insight, judgment intact.       IMAGING    DG Chest 2 View  Result Date: 01/06/2021 CLINICAL DATA:  Shortness of breath. EXAM: CHEST - 2 VIEW COMPARISON:  Jul 04, 2019. FINDINGS: Stable cardiomediastinal silhouette. Minimal bibasilar subsegmental atelectasis is noted. Bony thorax is unremarkable. IMPRESSION: Minimal bibasilar subsegmental atelectasis. Electronically Signed   By: Marijo Conception M.D.   On: 01/06/2021 13:26   CT CHEST WO CONTRAST  Result Date: 01/14/2021 CLINICAL DATA:  Worsening hypoxia.  Evaluate for possible fusion. EXAM: CT CHEST  WITHOUT CONTRAST TECHNIQUE: Multidetector CT imaging of the chest was performed following the standard protocol without IV contrast. COMPARISON:  Chest radiograph dated 01/14/2021. FINDINGS: Evaluation of this exam is limited in the absence of intravenous contrast. Cardiovascular: There is no cardiomegaly or pericardial effusion. There is coronary vascular calcification. Moderate atherosclerotic calcification of the thoracic aorta. No aneurysmal dilatation. The central pulmonary arteries are grossly unremarkable. Mediastinum/Nodes: No hilar or mediastinal adenopathy. The esophagus is grossly unremarkable. There is a 2.8 cm exophytic right posterior thyroid nodule. In the setting of significant comorbidities or limited life expectancy, no follow-up recommended (ref: J Am Coll Radiol. 2015 Feb;12(2): 143-50).No mediastinal fluid collection. Lungs/Pleura: There is an area of consolidation with air bronchogram involving the left lung base and a smaller area at the right lung base which may represent atelectasis or infiltrate. There is diffuse nodular densities involving the lower lobes consistent with pneumonia. No pleural effusion or pneumothorax. The central airways are patent. Upper Abdomen: Cholecystectomy. Probable cirrhosis. There is a 1 cm left adrenal adenoma. A 4.5 cm exophytic hypodense lesion from the upper pole of the left kidney, likely a cyst. Musculoskeletal: Osteopenia with degenerative changes of the spine. Anterior bridging osteophyte consistent with dish. No acute osseous pathology. IMPRESSION: 1. Bilateral lower lobe pneumonia. 2. Left adrenal adenoma. 3. Aortic Atherosclerosis (ICD10-I70.0). Electronically Signed   By: Anner Crete M.D.   On: 01/14/2021 19:56   US Venous Img Lower Bilateral (DVT)  Result Date: 01/17/2021 CLINICAL DATA:  Hypoxemia EXAM: BILATERAL LOWER EXTREMITY VENOUS DOPPLER ULTRASOUND TECHNIQUE: Gray-scale sonography with graded compression, as well as color Doppler and  duplex ultrasound were performed to evaluate the lower extremity deep venous systems from the level of the common femoral vein and including the common femoral, femoral, profunda femoral, popliteal and calf veins including the posterior tibial, peroneal and gastrocnemius veins when visible. The superficial great saphenous vein was also interrogated. Spectral Doppler was utilized to evaluate flow at rest and with distal augmentation maneuvers in the common femoral, femoral and popliteal veins. COMPARISON:  CT chest, 01/14/2021. FINDINGS: RIGHT LOWER EXTREMITY VENOUS Normal compressibility of the RIGHT common femoral, superficial femoral, and popliteal veins, as well as the visualized calf veins. Visualized portions of profunda femoral vein and great saphenous vein unremarkable. No filling defects to suggest DVT on grayscale or color Doppler imaging. Doppler waveforms show normal direction  of venous flow, normal respiratory plasticity and response to augmentation. OTHER No evidence of superficial thrombophlebitis or abnormal fluid collection. Limitations: none LEFT LOWER EXTREMITY VENOUS Normal compressibility of the LEFT common femoral, superficial femoral, and popliteal veins, as well as the visualized calf veins. Visualized portions of profunda femoral vein and great saphenous vein unremarkable. No filling defects to suggest DVT on grayscale or color Doppler imaging. Doppler waveforms show normal direction of venous flow, normal respiratory plasticity and response to augmentation. OTHER No evidence of superficial thrombophlebitis or abnormal fluid collection. Limitations: none IMPRESSION: No evidence of femoropopliteal DVT within either lower extremity. Michaelle Birks, MD [facility] Electronically Signed   By: Michaelle Birks M.D.   On: 01/17/2021 16:37   DG Chest Port 1 View  Result Date: 01/21/2021 CLINICAL DATA:  Shortness of breath EXAM: PORTABLE CHEST  1 VIEW COMPARISON:  01/14/2021 FINDINGS: Transverse diameter of heart is increased. There are no signs of pulmonary edema. There are patchy infiltrates in both lower lung fields with interval worsening. Upper lung fields are essentially clear. There is blunting of lateral CP angles. There is no pneumothorax. IMPRESSION: Cardiomegaly. There are linear densities in the lower lung fields suggesting subsegmental atelectasis/pneumonitis with interval worsening. Small bilateral pleural effusions. Electronically Signed   By: Elmer Picker M.D.   On: 01/21/2021 13:14   DG Chest Port 1 View  Result Date: 01/14/2021 CLINICAL DATA:  Hypoxemia EXAM: PORTABLE CHEST 1 VIEW COMPARISON:  01/09/2021, 01/06/2021, 07/04/2019 FINDINGS: Suspicion of small bilateral pleural effusions. Streaky basilar opacities. Stable cardiomediastinal silhouette. No pneumothorax. IMPRESSION: Probable small pleural effusions. Streaky basilar opacities may reflect atelectasis versus mild pneumonia Electronically Signed   By: Donavan Foil M.D.   On: 01/14/2021 15:30   DG Chest Port 1 View  Result Date: 01/09/2021 CLINICAL DATA:  Dyspnea. EXAM: PORTABLE CHEST 1 VIEW COMPARISON:  January 06, 2021. FINDINGS: The heart size and mediastinal contours are within normal limits. Both lungs are clear. The visualized skeletal structures are unremarkable. IMPRESSION: No active disease. Electronically Signed   By: Marijo Conception M.D.   On: 01/09/2021 16:24   ECHOCARDIOGRAM COMPLETE  Result Date: 01/07/2021    ECHOCARDIOGRAM REPORT   Patient Name:   Brandi Hoover Date of Exam: 01/07/2021 Medical Rec #:  [id number]     Height:       66.5 in Accession #:    [id number]    Weight:       264.3 lb Date of Birth:  [birth year]     BSA:          2.264 m Patient Age:    71 years      BP:           146/74 mmHg Patient Gender: F             HR:           115 bpm. Exam Location:  [facility] Procedure: 2D Echo, Color Doppler and Cardiac Doppler Indications:     I50.31  congestive heart failure-Acute Diastolic  History:         Patient has no prior history of Echocardiogram examinations.                  Risk Factors:Hypertension, Diabetes and HCL.  Sonographer:     Charmayne Sheer Referring Phys:  [id number] Gretta Cool PATEL Diagnosing Phys: Kate Sable MD  Sonographer Comments: Suboptimal parasternal window and suboptimal subcostal window. Image acquisition challenging due to  patient body habitus. IMPRESSIONS  1. Left ventricular ejection fraction, by estimation, is 55%. The left ventricle has normal function. Left ventricular endocardial border not optimally defined to evaluate regional wall motion. Left ventricular diastolic parameters are indeterminate.  2. Right ventricular systolic function is normal. The right ventricular size is normal.  3. The mitral valve is normal in structure. No evidence of mitral valve regurgitation.  4. The aortic valve was not well visualized. Aortic valve regurgitation is not visualized.  5. The inferior vena cava is normal in size with greater than 50% respiratory variability, suggesting right atrial pressure of 3 mmHg. FINDINGS  Left Ventricle: Left ventricular ejection fraction, by estimation, is 55%. The left ventricle has normal function. Left ventricular endocardial border not optimally defined to evaluate regional wall motion. The global longitudinal strain is normal despite suboptimal segment tracking. The left ventricular internal cavity size was normal in size. There is no left ventricular hypertrophy. Left ventricular diastolic parameters are indeterminate. Right Ventricle: The right ventricular size is normal. No increase in right ventricular wall thickness. Right ventricular systolic function is normal. Left Atrium: Left atrial size was normal in size. Right Atrium: Right atrial size was not well visualized. Pericardium: There is no evidence of pericardial effusion. Mitral Valve: The mitral valve is normal in structure. No evidence of mitral  valve regurgitation. MV peak gradient, 13.8 mmHg. The mean mitral valve gradient is 7.0 mmHg. Tricuspid Valve: The tricuspid valve is not well visualized. Tricuspid valve regurgitation is not demonstrated. Aortic Valve: The aortic valve was not well visualized. Aortic valve regurgitation is not visualized. Aortic valve mean gradient measures 9.0 mmHg. Aortic valve peak gradient measures 15.7 mmHg. Aortic valve area, by VTI measures 2.39 cm. Pulmonic Valve: The pulmonic valve was normal in structure. Pulmonic valve regurgitation is not visualized. Aorta: The aortic root is normal in size and structure. Venous: The inferior vena cava is normal in size with greater than 50% respiratory variability, suggesting right atrial pressure of 3 mmHg. IAS/Shunts: No atrial level shunt detected by color flow Doppler.  LEFT VENTRICLE PLAX 2D LVOT diam:     2.10 cm      Diastology LV SV:         93           LV e' medial:    13.20 cm/s LV SV Index:   41           LV E/e' medial:  12.4 LVOT Area:     3.46 cm     LV e' lateral:   7.40 cm/s                             LV E/e' lateral: 22.2  LV Volumes (MOD) LV vol d, MOD A2C: 105.0 ml LV vol d, MOD A4C: 122.0 ml LV vol s, MOD A2C: 59.9 ml LV vol s, MOD A4C: 64.8 ml LV SV MOD A2C:     45.1 ml LV SV MOD A4C:     122.0 ml LV SV MOD BP:      62.1 ml LEFT ATRIUM           Index LA Vol (A4C): 72.4 ml 31.98 ml/m  AORTIC VALVE                     PULMONIC VALVE AV Area (Vmax):    2.48 cm      PV Vmax:  1.10 m/s AV Area (Vmean):   2.36 cm      PV Vmean:      80.500 cm/s AV Area (VTI):     2.39 cm      PV VTI:        0.229 m AV Vmax:           198.00 cm/s   PV Peak grad:  4.8 mmHg AV Vmean:          142.000 cm/s  PV Mean grad:  3.0 mmHg AV VTI:            0.390 m AV Peak Grad:      15.7 mmHg AV Mean Grad:      9.0 mmHg LVOT Vmax:         142.00 cm/s LVOT Vmean:        96.700 cm/s LVOT VTI:          0.269 m LVOT/AV VTI ratio: 0.69  AORTA Ao Root diam: 2.70 cm MITRAL VALVE MV Area  (PHT): 6.27 cm     SHUNTS MV Area VTI:   3.28 cm     Systemic VTI:  0.27 m MV Peak grad:  13.8 mmHg    Systemic Diam: 2.10 cm MV Mean grad:  7.0 mmHg MV Vmax:       1.86 m/s MV Vmean:      123.0 cm/s MV Decel Time: 121 msec MV E velocity: 164.20 cm/s MV A velocity: 142.00 cm/s MV E/A ratio:  1.16 Kate Sable MD Electronically signed by Kate Sable MD Signature Date/Time: 01/07/2021/5:24:31 PM    Final       CLINICAL DATA:  Worsening hypoxia.  Evaluate for possible fusion.   EXAM: CT CHEST WITHOUT CONTRAST   TECHNIQUE: Multidetector CT imaging of the chest was performed following the standard protocol without IV contrast.   COMPARISON:  Chest radiograph dated 01/14/2021.   FINDINGS: Evaluation of this exam is limited in the absence of intravenous contrast.   Cardiovascular: There is no cardiomegaly or pericardial effusion. There is coronary vascular calcification. Moderate atherosclerotic calcification of the thoracic aorta. No aneurysmal dilatation. The central pulmonary arteries are grossly unremarkable.   Mediastinum/Nodes: No hilar or mediastinal adenopathy. The esophagus is grossly unremarkable. There is a 2.8 cm exophytic right posterior thyroid nodule. In the setting of significant comorbidities or limited life expectancy, no follow-up recommended (ref: J Am Coll Radiol. 2015 Feb;12(2): 143-50).No mediastinal fluid collection.   Lungs/Pleura: There is an area of consolidation with air bronchogram involving the left lung base and a smaller area at the right lung base which may represent atelectasis or infiltrate. There is diffuse nodular densities involving the lower lobes consistent with pneumonia. No pleural effusion or pneumothorax. The central airways are patent.   Upper Abdomen: Cholecystectomy. Probable cirrhosis. There is a 1 cm left adrenal adenoma. A 4.5 cm exophytic hypodense lesion from the upper pole of the left kidney, likely a cyst.    Musculoskeletal: Osteopenia with degenerative changes of the spine. Anterior bridging osteophyte consistent with dish. No acute osseous pathology.   IMPRESSION: 1. Bilateral lower lobe pneumonia. 2. Left adrenal adenoma. 3. Aortic Atherosclerosis (ICD10-I70.0).     Electronically Signed   By: Anner Crete M.D.   On: 01/14/2021 19:56  ASSESSMENT/PLAN   Acute hypoxemic respiratory failure Present on admission due to viral pneumonia secondary to metaneumovirus -patient may have developed bacterial pneumonia superimposed on viral and we will obtain Procalcitonin despite renal impairment to follow trend. There is  MRSA panel negative from 12/8.  Patient has been on anibiotics empirically.  -There is consolidated infiltrate bilaterally worse on left as evidenced above.  -patient should be encouraged to cough and expectorate debris to allow lung recruitment. Flutter valve would help with this.  -there is not much fluid in lungs or around lungs which is helpful because we dont have to diurese with CKD. -She may benefit from more aggressive bronchopulmoary hygiene -will stop doxycycline due to elevated risk of pill induced esophagitis and unlilkely helping much with MRSA negative and having rocephin IV going still.  -Will dc tessalon and tussinex since its likely hindering cough reflex and in her case coughing would help    Moderate persistent asthma with acute exacerbation  - Due to viral pneumonia with metapneumovirus -patient has been on solumedrol, agree with transition to prednisone, currently on 40mg  -patient is with high phlegm and infiltrates, will advance regiment with Mucomyst 65ml BID 20% -continue Duoneb as ordered    Bibasilar atelectasis     - patient with incentive spirometry      - encourage to use flutter valve      - encourage participation with PT patient was able to stand take few shuffling steps from bed to chair only.     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  Total face to face encounter time for this patient visit was >45 min. >50% of the time was  spent in counseling and coordination of care.   Patient/Family are satisfied with care plan and all questions have been answered.  This document was prepared using Dragon voice recognition software and may include unintentional dictation errors.     Ottie Glazier, M.D.  Division of [facility]

## 2021-01-23 LAB — BASIC METABOLIC PANEL
Anion gap: 8 (ref 5–15)
BUN: 54 mg/dL — ABNORMAL HIGH (ref 8–23)
CO2: 29 mmol/L (ref 22–32)
Calcium: 8.2 mg/dL — ABNORMAL LOW (ref 8.9–10.3)
Chloride: 101 mmol/L (ref 98–111)
Creatinine, Ser: 1.83 mg/dL — ABNORMAL HIGH (ref 0.44–1.00)
GFR, Estimated: 27 mL/min — ABNORMAL LOW (ref >60)
Glucose, Bld: 134 mg/dL — ABNORMAL HIGH (ref 70–99)
Potassium: 4 mmol/L (ref 3.5–5.1)
Sodium: 138 mmol/L (ref 135–145)

## 2021-01-23 LAB — CBC
HCT: 31 % — ABNORMAL LOW (ref 36.0–46.0)
Hemoglobin: 9.6 g/dL — ABNORMAL LOW (ref 12.0–15.0)
MCH: 28.8 pg (ref 26.0–34.0)
MCHC: 31 g/dL (ref 30.0–36.0)
MCV: 93.1 fL (ref 80.0–100.0)
Platelets: 270 10*3/uL (ref 150–400)
RBC: 3.33 MIL/uL — ABNORMAL LOW (ref 3.87–5.11)
RDW: 13.1 % (ref 11.5–15.5)
WBC: 8.8 10*3/uL (ref 4.0–10.5)
nRBC: 0 % (ref 0.0–0.2)

## 2021-01-23 LAB — GLUCOSE, CAPILLARY
Glucose-Capillary: 144 mg/dL — ABNORMAL HIGH (ref 70–99)
Glucose-Capillary: 153 mg/dL — ABNORMAL HIGH (ref 70–99)

## 2021-01-23 NOTE — Discharge Summary (Signed)
Physician Discharge Summary  VEOLA CAFARO XHB:[id number] DOB: 1941/01/16 DOA: 01/06/2021  PCP: Harrel Lemon, MD  Admit date: 01/06/2021 Discharge date: 01/23/2021  Admitted From: home  Disposition:  SNF  Recommendations for Outpatient Follow-up:  Follow up with PCP in 1-2 weeks F/u w/ pulmon., Dr. Lanney Gins, in 2-3 weeks  Home Health: no Equipment/Devices: 3L [place]   Discharge Condition: stable  CODE STATUS: full  Diet recommendation: Heart Healthy / Carb Modified  w/ 1555mL fluid restriction  Brief/Interim Summary: HPI was taken from Dr. Florina Ou: Brandi Hoover is a 80 y.o. female seen in ed with complaints of shortness of breath and wheezing for the past few days.  Patient states that her daughter has been ill with the flu and she has been exposed.  She denies any fevers chills chest pain palpitations headaches blurred vision speech or gait issues at baseline she states that she can keep her house clean and take care of herself her kitchen her home.  But right now she cannot.  Patient also denies any fevers chills abdominal pain nausea vomiting diarrhea urinary bladder issues skin or joint issues.  Patient states that she does note in addition to the shortness of breath gets worse when she is walking or when she is laying down and she prefers to sleep sitting up.  Patient reports that her right leg has been swelling for an unknown duration but usually does not get swelling in her legs.  And at the end of encounter patient reports chest discomfort that is with shortness of breath and on exertion. She cannot quantify how much out of a 10 she states that it varies with the distance and the amount of exertion.  And the only blood thinner she takes is a baby aspirin.  Patient is not on oxygen at home.   Pt has past medical history of asthma, diabetes mellitus type 2, hypertension, obesity.  As per Dr. Si Raider: Brandi Hoover is a 80 y.o. female with hx of asthma/COPD, diabetes mellitus type 2,  hypertension, obesity who presented with complaints of shortness of breath and wheezing for the past few days.  Patient stated that her daughter has been ill with some respiratory illness.   As per Dr. Jimmye Norman 12/14-12/16/22: Pt had already completed a course of abxs and pt was on a steroid taper. Steroid taper was completed today. Pt's oxygen demand intermittently increased w/ exertion so d/c was held a couple of days. Pt has been stable on 3L [place] even with exertion since and SaO2 level improves w/ pursed lip breathing. For more information, please see previous progress/ consult notes.     Discharge Diagnoses:  Principal Problem:   Respiratory failure with hypoxia ([place]) Active Problems:   COPD (chronic obstructive pulmonary disease) (HCC)   Anemia   Hypertension   Multinodular goiter   Stage 3b chronic kidney disease (HCC)   Type 2 diabetes mellitus with stage 3b chronic kidney disease, without long-term current use of insulin (HCC)   Acute respiratory failure with hypoxia (HCC)  Acute hypoxic respiratory failure: secondary to metapneumovirus pneumonia. Possible superimposed bacterial pneumonia. Continue on supplemental oxygen and wean as tolerated. Desaturates when trying to wean down oxygen. Completed abx course. Completed steroid course today. Continue on bronchodilators. Pulmon following and recs apprec    Asthma exacerbation: moderate persistent. Continue on steroids, bronchodilators and encourage incentive spirometry. Pulmon following and recs apprec   HTN: continue on home dose of CCB, benazepril, & HCTZ.   CKDIIIa: Cr is  labile. Avoid nephrotoxic meds   DM2: fairly well controlled, HbA1c 7.1. Continue on home anti-DM meds   Morbid Obesity: BMI 42.0. Complicates overall care & prognosis   Generalized weakness: PT/OT recs SNF    Adrenal adenoma: incidental finding on CT. Will f/u outpatient w/ PCP  Discharge Instructions  Discharge Instructions     Diet Carb Modified    Complete by: As directed    w/ 1531mL fluid restriction   Discharge instructions   Complete by: As directed    F/u w/ PCP in 1-2 weeks. F/u w/ pulmon, Dr. Lanney Gins, in 2-3 weeks   Increase activity slowly   Complete by: As directed       Allergies as of 01/23/2021       Reactions   Mometasone Furo-formoterol Fum Shortness Of Breath   Pt unable to breath   Azithromycin Itching   Gabapentin Other (See Comments)   Bad dreams, insomnia   Omeprazole Other (See Comments)   Reaction: unknown   Other Other (See Comments)   Made her feel worse   Ramipril Other (See Comments)   Reaction: unknown        Medication List     STOP taking these medications    ibuprofen 200 MG tablet Commonly known as: ADVIL   Januvia 25 MG tablet Generic drug: sitaGLIPtin       TAKE these medications    albuterol 108 (90 Base) MCG/ACT inhaler Commonly known as: VENTOLIN HFA Inhale 2 puffs into the lungs every 6 (six) hours as needed for wheezing.   amLODipine 10 MG tablet Commonly known as: NORVASC Take 10 mg by mouth daily.   ascorbic acid 1000 MG tablet Commonly known as: VITAMIN C Take 1,000 mg by mouth daily.   aspirin EC 81 MG tablet Take 81 mg by mouth daily.   benazepril 20 MG tablet Commonly known as: LOTENSIN Take 20 mg by mouth 2 (two) times daily.   Calcium Carbonate-Vitamin D 600-400 MG-UNIT tablet Take 1 tablet by mouth at bedtime.   docusate sodium 100 MG capsule Commonly known as: COLACE Take 1 capsule (100 mg total) by mouth 2 (two) times daily.   Fluticasone-Salmeterol 250-50 MCG/DOSE Aepb Commonly known as: ADVAIR Inhale 1 puff into the lungs 2 (two) times daily.   glipiZIDE 10 MG tablet Commonly known as: GLUCOTROL Take 10 mg by mouth 2 (two) times daily before a meal.   guaiFENesin 600 MG 12 hr tablet Commonly known as: MUCINEX Take 1 tablet (600 mg total) by mouth 2 (two) times daily.   guaiFENesin-codeine 100-10 MG/5ML syrup Take 5 mLs by  mouth at bedtime as needed for cough.   hydrochlorothiazide 12.5 MG tablet Commonly known as: HYDRODIURIL Take 12.5 mg by mouth 2 (two) times daily.   ipratropium-albuterol 0.5-2.5 (3) MG/3ML Soln Commonly known as: DUONEB Take 3 mLs by nebulization every 6 (six) hours as needed (wheezing, shortness of breath).   metFORMIN 500 MG tablet Commonly known as: GLUCOPHAGE Take 1,000-1,500 mg by mouth 2 (two) times daily with a meal. 1500 mg in the morning and 1000 mg in the evening   multivitamin with minerals Tabs tablet Take 1 tablet by mouth daily.   phenol-menthol 14.5 MG lozenge Place 1 lozenge inside cheek as needed for sore throat.   polyethylene glycol 17 g packet Commonly known as: MIRALAX / GLYCOLAX Take 17 g by mouth daily. HOLD for > 2 bowel movements/day   simvastatin 20 MG tablet Commonly known as: ZOCOR Take 20 mg by  mouth at bedtime.   traMADol 50 MG tablet Commonly known as: Ultram Take 1 tablet (50 mg total) by mouth every 6 (six) hours as needed.   vitamin B-12 1000 MCG tablet Commonly known as: CYANOCOBALAMIN Take 1,000 mcg by mouth daily.        Contact information for after-discharge care     Destination     [facility] .   Service: Skilled Nursing Contact information: [address] [phone]                    Allergies  Allergen Reactions   Mometasone Furo-Formoterol Fum Shortness Of Breath    Pt unable to breath   Azithromycin Itching   Gabapentin Other (See Comments)    Bad dreams, insomnia   Omeprazole Other (See Comments)    Reaction: unknown   Other Other (See Comments)    Made her feel worse   Ramipril Other (See Comments)    Reaction: unknown    Consultations: Pulmon    Procedures/Studies: DG Chest 2 View  Result Date: 01/06/2021 CLINICAL DATA:  Shortness of breath. EXAM: CHEST - 2 VIEW COMPARISON:  Jul 04, 2019. FINDINGS: Stable cardiomediastinal  silhouette. Minimal bibasilar subsegmental atelectasis is noted. Bony thorax is unremarkable. IMPRESSION: Minimal bibasilar subsegmental atelectasis. Electronically Signed   By: Marijo Conception M.D.   On: 01/06/2021 13:26   CT CHEST WO CONTRAST  Result Date: 01/14/2021 CLINICAL DATA:  Worsening hypoxia.  Evaluate for possible fusion. EXAM: CT CHEST WITHOUT CONTRAST TECHNIQUE: Multidetector CT imaging of the chest was performed following the standard protocol without IV contrast. COMPARISON:  Chest radiograph dated 01/14/2021. FINDINGS: Evaluation of this exam is limited in the absence of intravenous contrast. Cardiovascular: There is no cardiomegaly or pericardial effusion. There is coronary vascular calcification. Moderate atherosclerotic calcification of the thoracic aorta. No aneurysmal dilatation. The central pulmonary arteries are grossly unremarkable. Mediastinum/Nodes: No hilar or mediastinal adenopathy. The esophagus is grossly unremarkable. There is a 2.8 cm exophytic right posterior thyroid nodule. In the setting of significant comorbidities or limited life expectancy, no follow-up recommended (ref: J Am Coll Radiol. 2015 Feb;12(2): 143-50).No mediastinal fluid collection. Lungs/Pleura: There is an area of consolidation with air bronchogram involving the left lung base and a smaller area at the right lung base which may represent atelectasis or infiltrate. There is diffuse nodular densities involving the lower lobes consistent with pneumonia. No pleural effusion or pneumothorax. The central airways are patent. Upper Abdomen: Cholecystectomy. Probable cirrhosis. There is a 1 cm left adrenal adenoma. A 4.5 cm exophytic hypodense lesion from the upper pole of the left kidney, likely a cyst. Musculoskeletal: Osteopenia with degenerative changes of the spine. Anterior bridging osteophyte consistent with dish. No acute osseous pathology. IMPRESSION: 1. Bilateral lower lobe pneumonia. 2. Left adrenal adenoma.  3. Aortic Atherosclerosis (ICD10-I70.0). Electronically Signed   By: Anner Crete M.D.   On: 01/14/2021 19:56   US Venous Img Lower Bilateral (DVT)  Result Date: 01/17/2021 CLINICAL DATA:  Hypoxemia EXAM: BILATERAL LOWER EXTREMITY VENOUS DOPPLER ULTRASOUND TECHNIQUE: Gray-scale sonography with graded compression, as well as color Doppler and duplex ultrasound were performed to evaluate the lower extremity deep venous systems from the level of the common femoral vein and including the common femoral, femoral, profunda femoral, popliteal and calf veins including the posterior tibial, peroneal and gastrocnemius veins when visible. The superficial great saphenous vein was also interrogated. Spectral Doppler was utilized to evaluate flow  at rest and with distal augmentation maneuvers in the common femoral, femoral and popliteal veins. COMPARISON:  CT chest, 01/14/2021. FINDINGS: RIGHT LOWER EXTREMITY VENOUS Normal compressibility of the RIGHT common femoral, superficial femoral, and popliteal veins, as well as the visualized calf veins. Visualized portions of profunda femoral vein and great saphenous vein unremarkable. No filling defects to suggest DVT on grayscale or color Doppler imaging. Doppler waveforms show normal direction of venous flow, normal respiratory plasticity and response to augmentation. OTHER No evidence of superficial thrombophlebitis or abnormal fluid collection. Limitations: none LEFT LOWER EXTREMITY VENOUS Normal compressibility of the LEFT common femoral, superficial femoral, and popliteal veins, as well as the visualized calf veins. Visualized portions of profunda femoral vein and great saphenous vein unremarkable. No filling defects to suggest DVT on grayscale or color Doppler imaging. Doppler waveforms show normal direction of venous flow, normal respiratory plasticity and response to augmentation. OTHER No evidence of superficial thrombophlebitis or abnormal fluid collection.  Limitations: none IMPRESSION: No evidence of femoropopliteal DVT within either lower extremity. Michaelle Birks, MD [facility] Radiology Electronically Signed   By: Michaelle Birks M.D.   On: 01/17/2021 16:37   DG Chest Port 1 View  Result Date: 01/22/2021 CLINICAL DATA:  Pulmonary edema EXAM: PORTABLE CHEST 1 VIEW COMPARISON:  01/21/2021 FINDINGS: Mild improvement in right lower lobe atelectasis or infiltrate. Minimal right effusion No change in left lower lobe airspace disease and small left effusion Pulmonary vascularity normal.  Negative for edema Advanced degenerative change in both shoulders IMPRESSION: Improvement in right lower lobe airspace disease No change left lower lobe airspace disease and left effusion. Possible pneumonia. Electronically Signed   By: Franchot Gallo M.D.   On: 01/22/2021 15:44   DG Chest Port 1 View  Result Date: 01/21/2021 CLINICAL DATA:  Shortness of breath EXAM: PORTABLE CHEST 1 VIEW COMPARISON:  01/14/2021 FINDINGS: Transverse diameter of heart is increased. There are no signs of pulmonary edema. There are patchy infiltrates in both lower lung fields with interval worsening. Upper lung fields are essentially clear. There is blunting of lateral CP angles. There is no pneumothorax. IMPRESSION: Cardiomegaly. There are linear densities in the lower lung fields suggesting subsegmental atelectasis/pneumonitis with interval worsening. Small bilateral pleural effusions. Electronically Signed   By: Elmer Picker M.D.   On: 01/21/2021 13:14   DG Chest Port 1 View  Result Date: 01/14/2021 CLINICAL DATA:  Hypoxemia EXAM: PORTABLE CHEST 1 VIEW COMPARISON:  01/09/2021, 01/06/2021, 07/04/2019 FINDINGS: Suspicion of small bilateral pleural effusions. Streaky basilar opacities. Stable cardiomediastinal silhouette. No pneumothorax. IMPRESSION: Probable small pleural effusions. Streaky basilar opacities may reflect atelectasis versus mild  pneumonia Electronically Signed   By: Donavan Foil M.D.   On: 01/14/2021 15:30   DG Chest Port 1 View  Result Date: 01/09/2021 CLINICAL DATA:  Dyspnea. EXAM: PORTABLE CHEST 1 VIEW COMPARISON:  January 06, 2021. FINDINGS: The heart size and mediastinal contours are within normal limits. Both lungs are clear. The visualized skeletal structures are unremarkable. IMPRESSION: No active disease. Electronically Signed   By: Marijo Conception M.D.   On: 01/09/2021 16:24   ECHOCARDIOGRAM COMPLETE  Result Date: 01/07/2021    ECHOCARDIOGRAM REPORT   Patient Name:   DJUNA FRECHETTE Date of Exam: 01/07/2021 Medical Rec #:  [id number]     Height:       66.5 in Accession #:    [id number]    Weight:       264.3 lb Date of Birth:  04-21-1940     BSA:          2.264 m Patient Age:    22 years      BP:           146/74 mmHg Patient Gender: F             HR:           115 bpm. Exam Location:  [facility] Procedure: 2D Echo, Color Doppler and Cardiac Doppler Indications:     I50.31 congestive heart failure-Acute Diastolic  History:         Patient has no prior history of Echocardiogram examinations.                  Risk Factors:Hypertension, Diabetes and HCL.  Sonographer:     Charmayne Sheer Referring Phys:  [id number] Gretta Cool PATEL Diagnosing Phys: Kate Sable MD  Sonographer Comments: Suboptimal parasternal window and suboptimal subcostal window. Image acquisition challenging due to patient body habitus. IMPRESSIONS  1. Left ventricular ejection fraction, by estimation, is 55%. The left ventricle has normal function. Left ventricular endocardial border not optimally defined to evaluate regional wall motion. Left ventricular diastolic parameters are indeterminate.  2. Right ventricular systolic function is normal. The right ventricular size is normal.  3. The mitral valve is normal in structure. No evidence of mitral valve regurgitation.  4. The aortic valve was not well visualized. Aortic valve regurgitation is not visualized.  5. The  inferior vena cava is normal in size with greater than 50% respiratory variability, suggesting right atrial pressure of 3 mmHg. FINDINGS  Left Ventricle: Left ventricular ejection fraction, by estimation, is 55%. The left ventricle has normal function. Left ventricular endocardial border not optimally defined to evaluate regional wall motion. The global longitudinal strain is normal despite suboptimal segment tracking. The left ventricular internal cavity size was normal in size. There is no left ventricular hypertrophy. Left ventricular diastolic parameters are indeterminate. Right Ventricle: The right ventricular size is normal. No increase in right ventricular wall thickness. Right ventricular systolic function is normal. Left Atrium: Left atrial size was normal in size. Right Atrium: Right atrial size was not well visualized. Pericardium: There is no evidence of pericardial effusion. Mitral Valve: The mitral valve is normal in structure. No evidence of mitral valve regurgitation. MV peak gradient, 13.8 mmHg. The mean mitral valve gradient is 7.0 mmHg. Tricuspid Valve: The tricuspid valve is not well visualized. Tricuspid valve regurgitation is not demonstrated. Aortic Valve: The aortic valve was not well visualized. Aortic valve regurgitation is not visualized. Aortic valve mean gradient measures 9.0 mmHg. Aortic valve peak gradient measures 15.7 mmHg. Aortic valve area, by VTI measures 2.39 cm. Pulmonic Valve: The pulmonic valve was normal in structure. Pulmonic valve regurgitation is not visualized. Aorta: The aortic root is normal in size and structure. Venous: The inferior vena cava is normal in size with greater than 50% respiratory variability, suggesting right atrial pressure of 3 mmHg. IAS/Shunts: No atrial level shunt detected by color flow Doppler.  LEFT VENTRICLE PLAX 2D LVOT diam:     2.10 cm      Diastology LV SV:         93           LV e' medial:    13.20 cm/s LV SV Index:   41           LV E/e'  medial:  12.4 LVOT Area:     3.46 cm  LV e' lateral:   7.40 cm/s                             LV E/e' lateral: 22.2  LV Volumes (MOD) LV vol d, MOD A2C: 105.0 ml LV vol d, MOD A4C: 122.0 ml LV vol s, MOD A2C: 59.9 ml LV vol s, MOD A4C: 64.8 ml LV SV MOD A2C:     45.1 ml LV SV MOD A4C:     122.0 ml LV SV MOD BP:      62.1 ml LEFT ATRIUM           Index LA Vol (A4C): 72.4 ml 31.98 ml/m  AORTIC VALVE                     PULMONIC VALVE AV Area (Vmax):    2.48 cm      PV Vmax:       1.10 m/s AV Area (Vmean):   2.36 cm      PV Vmean:      80.500 cm/s AV Area (VTI):     2.39 cm      PV VTI:        0.229 m AV Vmax:           198.00 cm/s   PV Peak grad:  4.8 mmHg AV Vmean:          142.000 cm/s  PV Mean grad:  3.0 mmHg AV VTI:            0.390 m AV Peak Grad:      15.7 mmHg AV Mean Grad:      9.0 mmHg LVOT Vmax:         142.00 cm/s LVOT Vmean:        96.700 cm/s LVOT VTI:          0.269 m LVOT/AV VTI ratio: 0.69  AORTA Ao Root diam: 2.70 cm MITRAL VALVE MV Area (PHT): 6.27 cm     SHUNTS MV Area VTI:   3.28 cm     Systemic VTI:  0.27 m MV Peak grad:  13.8 mmHg    Systemic Diam: 2.10 cm MV Mean grad:  7.0 mmHg MV Vmax:       1.86 m/s MV Vmean:      123.0 cm/s MV Decel Time: 121 msec MV E velocity: 164.20 cm/s MV A velocity: 142.00 cm/s MV E/A ratio:  1.16 Kate Sable MD Electronically signed by Kate Sable MD Signature Date/Time: 01/07/2021/5:24:31 PM    Final    (Echo, Carotid, EGD, Colonoscopy, ERCP)    Subjective: Pt denies any complaints    Discharge Exam: Vitals:   01/23/21 0745 01/23/21 1148  BP:  133/67  Pulse:  98  Resp:  18  Temp:  98.9 F (37.2 C)  SpO2: 97% 93%   Vitals:   01/23/21 0533 01/23/21 0740 01/23/21 0745 01/23/21 1148  BP: (!) 144/54 (!) 148/49  133/67  Pulse: 77 78  98  Resp: 17 18  18   Temp: 98.7 F (37.1 C) 98.6 F (37 C)  98.9 F (37.2 C)  TempSrc: Oral     SpO2: 94% 97% 97% 93%  Weight:      Height:        General: Pt is alert, awake, not in acute  distress Cardiovascular: S1/S2 +, no rubs, no gallops Respiratory: decreased breath sounds b/l otherwise clear  Abdominal: Soft, NT, ND, bowel sounds +  Extremities:  no cyanosis    The results of significant diagnostics from this hospitalization (including imaging, microbiology, ancillary and laboratory) are listed below for reference.     Microbiology: Recent Results (from the past 240 hour(s))  Expectorated Sputum Assessment w Gram Stain, Rflx to Resp Cult     Status: None   Collection Time: 01/15/21 12:20 PM   Specimen: Expectorated Sputum  Result Value Ref Range Status   Specimen Description EXPECTORATED SPUTUM  Final   Special Requests NONE  Final   Sputum evaluation   Final    Sputum specimen not acceptable for testing.  Please recollect.   TIFFANY ROBBINSON 01/16/21 1325 MW Performed at [facility], [address] [place], [place] [address]    Report Status 01/16/2021 FINAL  Final  MRSA Next Gen by PCR, Nasal     Status: None   Collection Time: 01/15/21  4:55 PM   Specimen: Nasal Mucosa; Nasal Swab  Result Value Ref Range Status   MRSA by PCR Next Gen NOT DETECTED NOT DETECTED Final    Comment: (NOTE) The GeneXpert MRSA Assay (FDA approved for NASAL specimens only), is one component of a comprehensive MRSA colonization surveillance program. It is not intended to diagnose MRSA infection nor to guide or monitor treatment for MRSA infections. Test performance is not FDA approved in patients less than 81 years old. Performed at [facility], [address] [place], [place] [address]      Labs: BNP (last 3 results) No results for input(s): BNP in the last 8760 hours. Basic Metabolic Panel: Recent Labs  Lab 01/18/21 0614 01/20/21 0613 01/21/21 0629 01/22/21 0610 01/23/21 0452  NA 139 139 141 141 138  K 4.0 4.6 4.3 4.4 4.0  CL 101 104 105 104 101  CO2 29 29 28 30 29   GLUCOSE 134* 106* 87 136* 134*  BUN 50* 55* 47* 51* 54*  CREATININE  1.80* 1.71* 1.66* 1.95* 1.83*  CALCIUM 8.1* 8.3* 8.4* 8.4* 8.2*   Liver Function Tests: No results for input(s): AST, ALT, ALKPHOS, BILITOT, PROT, ALBUMIN in the last 168 hours. No results for input(s): LIPASE, AMYLASE in the last 168 hours. No results for input(s): AMMONIA in the last 168 hours. CBC: Recent Labs  Lab 01/18/21 0614 01/22/21 0610 01/23/21 0452  WBC 13.2* 10.7* 8.8  HGB 9.6* 9.9* 9.6*  HCT 31.1* 31.6* 31.0*  MCV 94.2 92.7 93.1  PLT 264 272 270   Cardiac Enzymes: No results for input(s): CKTOTAL, CKMB, CKMBINDEX, TROPONINI in the last 168 hours. BNP: Invalid input(s): POCBNP CBG: Recent Labs  Lab 01/22/21 1230 01/22/21 1615 01/22/21 2156 01/22/21 2225 01/23/21 0741  GLUCAP 118* 224* 53* 85 144*   D-Dimer No results for input(s): DDIMER in the last 72 hours. Hgb A1c No results for input(s): HGBA1C in the last 72 hours. Lipid Profile No results for input(s): CHOL, HDL, LDLCALC, TRIG, CHOLHDL, LDLDIRECT in the last 72 hours. Thyroid function studies No results for input(s): TSH, T4TOTAL, T3FREE, THYROIDAB in the last 72 hours.  Invalid input(s): FREET3 Anemia work up No results for input(s): VITAMINB12, FOLATE, FERRITIN, TIBC, IRON, RETICCTPCT in the last 72 hours. Urinalysis    Component Value Date/Time   COLORURINE YELLOW (A) 07/04/2019 0131   APPEARANCEUR HAZY (A) 07/04/2019 0131   LABSPEC 1.011 07/04/2019 0131   PHURINE 5.0 07/04/2019 0131   GLUCOSEU 50 (A) 07/04/2019 0131   HGBUR NEGATIVE 07/04/2019 0131   BILIRUBINUR NEGATIVE 07/04/2019 0131   KETONESUR NEGATIVE 07/04/2019 0131  PROTEINUR 100 (A) 07/04/2019 0131   NITRITE NEGATIVE 07/04/2019 0131   LEUKOCYTESUR TRACE (A) 07/04/2019 0131   Sepsis Labs Invalid input(s): PROCALCITONIN,  WBC,  LACTICIDVEN Microbiology Recent Results (from the past 240 hour(s))  Expectorated Sputum Assessment w Gram Stain, Rflx to Resp Cult     Status: None   Collection Time: 01/15/21 12:20 PM   Specimen:  Expectorated Sputum  Result Value Ref Range Status   Specimen Description EXPECTORATED SPUTUM  Final   Special Requests NONE  Final   Sputum evaluation   Final    Sputum specimen not acceptable for testing.  Please recollect.   TIFFANY ROBBINSON 01/16/21 1325 MW Performed at [facility], [address] [place], [place] [address]    Report Status 01/16/2021 FINAL  Final  MRSA Next Gen by PCR, Nasal     Status: None   Collection Time: 01/15/21  4:55 PM   Specimen: Nasal Mucosa; Nasal Swab  Result Value Ref Range Status   MRSA by PCR Next Gen NOT DETECTED NOT DETECTED Final    Comment: (NOTE) The GeneXpert MRSA Assay (FDA approved for NASAL specimens only), is one component of a comprehensive MRSA colonization surveillance program. It is not intended to diagnose MRSA infection nor to guide or monitor treatment for MRSA infections. Test performance is not FDA approved in patients less than 79 years old. Performed at [facility] [facility], [address], [place], [place] [address]      Time coordinating discharge: Over 30 minutes  SIGNED:   Wyvonnia Dusky, MD  [facility] 01/23/2021, 12:14 PM Pager   If 7PM-7AM, please contact night-coverage

## 2021-01-23 NOTE — TOC Progression Note (Signed)
Transition of Care [facility]) - Progression Note    Patient Details  Name: Brandi Hoover MRN: [id number] Date of Birth: January 16, 1941  Transition of Care [facility]) CM/SW [place], RN Phone Number: 01/23/2021, 11:30 AM  Clinical Narrative:   Patient can discharge today to [facility], on 3L oxygen.  As per Rickey at [employer], patient can go to room E15 after 2pm.  EMS called for 2pm transport to facility    Expected Discharge Plan:  (Home health vs snf) Barriers to Discharge: Continued Medical Work up  Expected Discharge Plan and Services Expected Discharge Plan:  (Home health vs snf)   Discharge Planning Services: CM Consult   Living arrangements for the past 2 months: Single Family Home                                       Social Determinants of Health (SDOH) Interventions    Readmission Risk Interventions No flowsheet data found.

## 2021-01-23 NOTE — Progress Notes (Signed)
Occupational Therapy Treatment Patient Details Name: Brandi Hoover MRN: [id number] DOB: 06-03-1940 Today's Date: 01/23/2021   History of present illness Brandi Hoover is an 31yoF who comes [facility] on [facility]/06[facility]21 c weakness, SOB, wheezing. PMH: asthma, DM2, HTN, obesity. Pt admitted with ARF c hypoxia. At baseline pt lives alone with 24/5 supervision/asssist from DTR, household AMB c 4WW, no falls in past 6 months.   OT comments  Pt seen for OT tx. Dtr present. Pt endorsing feeling better and eager to report she has been using the incentive spirometer and acapella frequently. Quality of voice improved. Pt performed bed mobility with heaving BUE reliance on bed rails but no direct physical assist. MAX A for donning shoes EOB. Pt required bed to be elevated ~6' level with the [facility] to perform ADL transfer with MIN-MOD A + VC for hand/foot placement. Pt required increased assist standing from [facility] with VC for hand placement, pulling her feet back under her prior to attempt to minimize rocking back and forth and posterior LOB and required MOD A to complete. MAX A in standing for pericare with my requesting increasing assist to help her get back to bed requiring reassurance and cues to utilize PLB to minimize anxiety and fear of falling. Despite MAX cues, pt ultimately did sit prematurely. Pt able to verbalize what she did wrong though and how she is supposed to approach a seated transfer. SpO2 in low to mid 90's at rest on 3L O2, down to 88-89% on 3L with ADL/mobility, improving back up to low 90's with rest and PLB. Medical team notified. Pt progressing towards goals, requiring less overall oxygen this date. SNF remains most appropriate.    Recommendations for follow up therapy are one component of a multi-disciplinary discharge planning process, led by the attending physician.  Recommendations may be updated based on patient status, additional functional criteria and insurance authorization.    Follow Up  Recommendations  Skilled nursing-short term rehab (<3 hours/day)    Assistance Recommended at Discharge Frequent or constant Supervision/Assistance  Equipment Recommendations  Other (comment) (bariatric BSC)    Recommendations for Other Services      Precautions / Restrictions Precautions Precautions: Fall Precaution Comments: monitor O2 sats, HR, RR Restrictions Weight Bearing Restrictions: No       Mobility Bed Mobility Overal bed mobility: Needs Assistance Bed Mobility: Supine to Sit;Sit to Supine     Supine to sit: Supervision;HOB elevated Sit to supine: Min assist   General bed mobility comments: increased exertion/time + heavy bed rail reliance to perform, MIN A for RLE mgt back to bed only otherwise supervision    Transfers Overall transfer level: Needs assistance Equipment used: Rolling walker (2 wheels) Transfers: Sit to/from Stand;Bed to chair/wheelchair/BSC Sit to Stand: Mod assist;Min assist;From elevated surface   Step pivot transfers: Min assist;From elevated surface       General transfer comment: MAX VC for anterior weight shift, not sitting too prematurely, RW mgt, more difficulty getting up out of the BSC than the bed despite the same height     Balance Overall balance assessment: Needs assistance Sitting-balance support: Feet supported;Single extremity supported Sitting balance-Leahy Scale: Good     Standing balance support: Bilateral upper extremity supported;During functional activity;Reliant on assistive device for balance Standing balance-Leahy Scale: Poor                             ADL either performed or assessed with clinical judgement  ADL Overall ADL's : Needs assistance/impaired                         Toilet Transfer: Moderate assistance;Minimal assistance;BSC/3in1;Rolling walker (2 wheels);Stand-pivot;Cueing for sequencing;Cueing for safety Toilet Transfer Details (indicate cue type and reason): MAX VC  for sequencing, RW mgt, safety, and PLB Toileting- Clothing Manipulation and Hygiene: Sit to/from stand;Maximal assistance Toileting - Clothing Manipulation Details (indicate cue type and reason): Max A for pericare while pt was in standing, CGA-MIN A for standing balance, MAX VC for anterior weight shift to prevent LOB and PLB            Extremity/Trunk Assessment              Vision       Perception     Praxis      Cognition Arousal/Alertness: Awake/alert Behavior During Therapy: WFL for tasks assessed/performed;Anxious Overall Cognitive Status: Within Functional Limits for tasks assessed                                 General Comments: a bit anxious with mobility but improved from previous sessions          Exercises Other Exercises Other Exercises: Pt/dtr instructed in additional PLB techniques and strategies to minimize anxiety/panic with mobility where she begins to feel more SOB; educated in ADL transfer techniques to minimize falling/missing chair   Shoulder Instructions       General Comments      Pertinent Vitals/ Pain       Pain Assessment: No/denies pain  Home Living                                          Prior Functioning/Environment              Frequency  Min 2X/week        Progress Toward Goals  OT Goals(current goals can now be found in the care plan section)  Progress towards OT goals: Progressing toward goals  Acute Rehab OT Goals Patient Stated Goal: breathe better and go to rehab OT Goal Formulation: With patient/family Time For Goal Achievement: 02/03/21 Potential to Achieve Goals: Good  Plan Discharge plan remains appropriate;Frequency remains appropriate    Co-evaluation                 AM-PAC OT "6 Clicks" Daily Activity     Outcome Measure   Help from another person eating meals?: None Help from another person taking care of personal grooming?: None Help from another  person toileting, which includes using toliet, bedpan, or urinal?: A Lot Help from another person bathing (including washing, rinsing, drying)?: A Lot Help from another person to put on and taking off regular upper body clothing?: A Little Help from another person to put on and taking off regular lower body clothing?: A Lot 6 Click Score: 17    End of Session Equipment Utilized During Treatment: Oxygen;Rolling walker (2 wheels)  OT Visit Diagnosis: Other abnormalities of gait and mobility (R26.89);Muscle weakness (generalized) (M62.81)   Activity Tolerance Patient tolerated treatment well   Patient Left in bed;with call bell/phone within reach;with bed alarm set;with family/visitor present   Nurse Communication Other (comment) (need chuck pads replaced and linens for recliner)  Time: 3403-5248 OT Time Calculation (min): 38 min  Charges: OT General Charges $OT Visit: 1 Visit OT Treatments $Self Care/Home Management : 38-52 mins  Ardeth Perfect., MPH, MS, OTR/L ascom [phone] 01/23/21, 10:37 AM

## 2021-01-23 NOTE — Care Management Important Message (Signed)
Important Message  Patient Details  Name: Brandi Hoover MRN: [id number] Date of Birth: [birth year]   Medicare Important Message Given:  Yes     [name] A [name] 01/23/2021, 3:03 PM

## 2021-01-23 NOTE — Progress Notes (Signed)
PULMONOLOGY         Date: 01/23/2021,   MRN# [id number] SHALAE BELMONTE [birth year]     AdmissionWeight: 119.9 kg                 CurrentWeight: 119.9 kg   Referring physician: Dr. Si Raider   CHIEF COMPLAINT:   Persistent respiratory failure with hypoxemia    HISTORY OF PRESENT ILLNESS   80 yo F with hx of PMH of Asthma, DM, metabolic syndrome came in 11 days ago with flu like illness after exposure from family with similar symptoms. Patient is generally independent and denied severe symptoms or cardiac or GI or Neuro symptoms. ROS reveals RLE swelling in recent week prior to admission. She was admitted with COPD exacerbation and treated with doxycycline as well as solumedrol. She is noted to have renal impairment on admission of unknown chronicity and anemia. Renal function has improved when reviewing trend. WBC count has slightly treanded up over past 10d while on steroids and anemia is stable around 10.9. Ddimer has also treanded down quite a bit. ABG done 3d on 50%Fio2 is with severe hypoxia and elevated Aa gradient.  A DVT ultrasound study was negative bilaterally on 01/17/21. There was a CT chest done 01/14/21 I reviewed in detail. Patient is in no distress during my evaluation but in on HFNC and we reviewed medical plan together.   01/22/21- patient diuresed further overnight, delivered extra dose lasix 40 this am due to finding of mild pleural effusions forming on yesterday cxr. Holding Amlodipine this am and will repeat CXR at 1400.  Still can continue with dc planning.  Prednisone reduced to 10mg  daily will complete tommorow. Patient saturating 95% on 5L/min may be able to wean down.  01/23/21- patient is improved to 3L/min [place] saturating 100% while resting. Reviewed medical plan with attending physician Dr Jimmye Norman with family and patient together.  We are working on SNF placmenent now. Stopping steroids today.   PAST MEDICAL HISTORY   Past Medical History:  Diagnosis  Date   Asthma    Diabetes mellitus without complication ([place])    High cholesterol    Hypertension      SURGICAL HISTORY   Past Surgical History:  Procedure Laterality Date   right hip replacement       FAMILY HISTORY   Family History  Problem Relation Age of Onset   COPD Father    Stroke Mother      SOCIAL HISTORY   Social History   Tobacco Use   Smoking status: Never   Smokeless tobacco: Never  Substance Use Topics   Alcohol use: No   Drug use: No     MEDICATIONS    Home Medication:    Current Medication:  Current Facility-Administered Medications:    0.9 %  sodium chloride infusion, , Intravenous, PRN, Wouk, Ailene Rud, MD, Stopped at 01/19/21 2014   acetaminophen (TYLENOL) tablet 650 mg, 650 mg, Oral, Q6H PRN, 650 mg at 01/15/21 0900 **OR** acetaminophen (TYLENOL) suppository 650 mg, 650 mg, Rectal, Q6H PRN, Para Skeans, MD   ascorbic acid (VITAMIN C) tablet 250 mg, 250 mg, Oral, Daily, Florina Ou V, MD, 250 mg at 01/22/21 1109   enoxaparin (LOVENOX) injection 40 mg, 40 mg, Subcutaneous, Q24H, Rauer, Samantha O, RPH, 40 mg at 01/22/21 2226   fluticasone-salmeterol (ADVAIR) 250-50 MCG/ACT inhaler 1 puff, 1 puff, Inhalation, BID, Swayze, [name], DO, 1 puff at 01/22/21 1923   guaiFENesin (MUCINEX) 12 hr tablet  1,200 mg, 1,200 mg, Oral, BID, Wouk, Ailene Rud, MD, 1,200 mg at 01/22/21 2226   guaiFENesin (MUCINEX) 12 hr tablet 600 mg, 600 mg, Oral, BID PRN, Ottie Glazier, MD   hydrALAZINE (APRESOLINE) injection 5 mg, 5 mg, Intravenous, Q4H PRN, Para Skeans, MD, 5 mg at 01/10/21 1758   insulin aspart (novoLOG) injection 0-15 Units, 0-15 Units, Subcutaneous, TID WC, Enzo Bi, MD, 5 Units at 01/22/21 1728   insulin aspart (novoLOG) injection 0-5 Units, 0-5 Units, Subcutaneous, QHS, Enzo Bi, MD, 4 Units at 01/21/21 2211   insulin aspart (novoLOG) injection 3 Units, 3 Units, Subcutaneous, TID WC, Wouk, Ailene Rud, MD, 3 Units at 01/22/21 1727   insulin  glargine-yfgn (SEMGLEE) injection 24 Units, 24 Units, Subcutaneous, Daily, Gwynne Edinger, MD, 24 Units at 01/22/21 0852   ipratropium-albuterol (DUONEB) 0.5-2.5 (3) MG/3ML nebulizer solution 3 mL, 3 mL, Nebulization, Q6H, Wouk, Ailene Rud, MD, 3 mL at 01/23/21 0745   liver oil-zinc oxide (DESITIN) 40 % ointment, , Topical, PRN, Wyvonnia Dusky, MD   MEDLINE mouth rinse, 15 mL, Mouth Rinse, BID, Enzo Bi, MD, 15 mL at 01/22/21 1110   multivitamin with minerals tablet 1 tablet, 1 tablet, Oral, Daily, Enzo Bi, MD, 1 tablet at 01/22/21 0854   ondansetron (ZOFRAN) tablet 4 mg, 4 mg, Oral, Q6H PRN **OR** ondansetron (ZOFRAN) injection 4 mg, 4 mg, Intravenous, Q6H PRN, Posey Pronto, Gretta Cool, MD   polyethylene glycol (MIRALAX / GLYCOLAX) packet 17 g, 17 g, Oral, Daily, Wouk, Ailene Rud, MD, 17 g at 01/22/21 4680   predniSONE (DELTASONE) tablet 10 mg, 10 mg, Oral, Q breakfast, Ottie Glazier, MD, 10 mg at 01/22/21 3212   protein supplement (ENSURE MAX) liquid, 11 oz, Oral, QHS, Enzo Bi, MD, 11 oz at 01/20/21 2155   simvastatin (ZOCOR) tablet 20 mg, 20 mg, Oral, QHS, Florina Ou V, MD, 20 mg at 01/22/21 2226   sodium chloride (OCEAN) 0.65 % nasal spray 1 spray, 1 spray, Each Nare, PRN, Wouk, Ailene Rud, MD, 1 spray at 01/16/21 1242   traMADol (ULTRAM) tablet 50 mg, 50 mg, Oral, Q6H PRN, Para Skeans, MD    ALLERGIES   Mometasone furo-formoterol fum, Azithromycin, Gabapentin, Omeprazole, Other, and Ramipril     REVIEW OF SYSTEMS    Review of Systems:  Gen:  Denies  fever, sweats, chills weigh loss  HEENT: Denies blurred vision, double vision, ear pain, eye pain, hearing loss, nose bleeds, sore throat Cardiac:  No dizziness, chest pain or heaviness, chest tightness,edema Resp:   Denies cough or sputum porduction, shortness of breath,wheezing, hemoptysis,  Gi: Denies swallowing difficulty, stomach pain, nausea or vomiting, diarrhea, constipation, bowel incontinence Gu:  Denies  bladder incontinence, burning urine Ext:   Denies Joint pain, stiffness or swelling Skin: Denies  skin rash, easy bruising or bleeding or hives Endoc:  Denies polyuria, polydipsia , polyphagia or weight change Psych:   Denies depression, insomnia or hallucinations   Other:  All other systems negative   VS: BP (!) 148/49 (BP Location: Left Arm)    Pulse 78    Temp 98.6 F (37 C)    Resp 18    Ht 5' 6.5" (1.689 m)    Wt 119.9 kg    SpO2 97%    BMI 42.03 kg/m      PHYSICAL EXAM    GENERAL:NAD, no fevers, chills, no weakness no fatigue HEAD: Normocephalic, atraumatic.  EYES: Pupils equal, round, reactive to light. Extraocular muscles intact. No scleral icterus.  MOUTH: Moist mucosal membrane. Dentition intact. No abscess noted.  EAR, NOSE, THROAT: Clear without exudates. No external lesions.  NECK: Supple. No thyromegaly. No nodules. No JVD.  PULMONARY: rhonchi bilaterally  CARDIOVASCULAR: S1 and S2. Regular rate and rhythm. No murmurs, rubs, or gallops. No edema. Pedal pulses 2+ bilaterally.  GASTROINTESTINAL: Soft, nontender, nondistended. No masses. Positive bowel sounds. No hepatosplenomegaly.  MUSCULOSKELETAL: No swelling, clubbing, or edema. Range of motion full in all extremities.  NEUROLOGIC: Cranial nerves II through XII are intact. No gross focal neurological deficits. Sensation intact. Reflexes intact.  SKIN: No ulceration, lesions, rashes, or cyanosis. Skin warm and dry. Turgor intact.  PSYCHIATRIC: Mood, affect within normal limits. The patient is awake, alert and oriented x 3. Insight, judgment intact.       IMAGING    DG Chest 2 View  Result Date: 01/06/2021 CLINICAL DATA:  Shortness of breath. EXAM: CHEST - 2 VIEW COMPARISON:  Jul 04, 2019. FINDINGS: Stable cardiomediastinal silhouette. Minimal bibasilar subsegmental atelectasis is noted. Bony thorax is unremarkable. IMPRESSION: Minimal bibasilar subsegmental atelectasis. Electronically Signed   By: Marijo Conception M.D.   On: 01/06/2021 13:26   CT CHEST WO CONTRAST  Result Date: 01/14/2021 CLINICAL DATA:  Worsening hypoxia.  Evaluate for possible fusion. EXAM: CT CHEST WITHOUT CONTRAST TECHNIQUE: Multidetector CT imaging of the chest was performed following the standard protocol without IV contrast. COMPARISON:  Chest radiograph dated 01/14/2021. FINDINGS: Evaluation of this exam is limited in the absence of intravenous contrast. Cardiovascular: There is no cardiomegaly or pericardial effusion. There is coronary vascular calcification. Moderate atherosclerotic calcification of the thoracic aorta. No aneurysmal dilatation. The central pulmonary arteries are grossly unremarkable. Mediastinum/Nodes: No hilar or mediastinal adenopathy. The esophagus is grossly unremarkable. There is a 2.8 cm exophytic right posterior thyroid nodule. In the setting of significant comorbidities or limited life expectancy, no follow-up recommended (ref: J Am Coll Radiol. 2015 Feb;12(2): 143-50).No mediastinal fluid collection. Lungs/Pleura: There is an area of consolidation with air bronchogram involving the left lung base and a smaller area at the right lung base which may represent atelectasis or infiltrate. There is diffuse nodular densities involving the lower lobes consistent with pneumonia. No pleural effusion or pneumothorax. The central airways are patent. Upper Abdomen: Cholecystectomy. Probable cirrhosis. There is a 1 cm left adrenal adenoma. A 4.5 cm exophytic hypodense lesion from the upper pole of the left kidney, likely a cyst. Musculoskeletal: Osteopenia with degenerative changes of the spine. Anterior bridging osteophyte consistent with dish. No acute osseous pathology. IMPRESSION: 1. Bilateral lower lobe pneumonia. 2. Left adrenal adenoma. 3. Aortic Atherosclerosis (ICD10-I70.0). Electronically Signed   By: Anner Crete M.D.   On: 01/14/2021 19:56   US Venous Img Lower Bilateral (DVT)  Result Date: 01/17/2021 CLINICAL  DATA:  Hypoxemia EXAM: BILATERAL LOWER EXTREMITY VENOUS DOPPLER ULTRASOUND TECHNIQUE: Gray-scale sonography with graded compression, as well as color Doppler and duplex ultrasound were performed to evaluate the lower extremity deep venous systems from the level of the common femoral vein and including the common femoral, femoral, profunda femoral, popliteal and calf veins including the posterior tibial, peroneal and gastrocnemius veins when visible. The superficial great saphenous vein was also interrogated. Spectral Doppler was utilized to evaluate flow at rest and with distal augmentation maneuvers in the common femoral, femoral and popliteal veins. COMPARISON:  CT chest, 01/14/2021. FINDINGS: RIGHT LOWER EXTREMITY VENOUS Normal compressibility of the RIGHT common femoral, superficial femoral, and popliteal veins, as well as the visualized calf veins. Visualized portions  of profunda femoral vein and great saphenous vein unremarkable. No filling defects to suggest DVT on grayscale or color Doppler imaging. Doppler waveforms show normal direction of venous flow, normal respiratory plasticity and response to augmentation. OTHER No evidence of superficial thrombophlebitis or abnormal fluid collection. Limitations: none LEFT LOWER EXTREMITY VENOUS Normal compressibility of the LEFT common femoral, superficial femoral, and popliteal veins, as well as the visualized calf veins. Visualized portions of profunda femoral vein and great saphenous vein unremarkable. No filling defects to suggest DVT on grayscale or color Doppler imaging. Doppler waveforms show normal direction of venous flow, normal respiratory plasticity and response to augmentation. OTHER No evidence of superficial thrombophlebitis or abnormal fluid collection. Limitations: none IMPRESSION: No evidence of femoropopliteal DVT within either lower extremity. Michaelle Birks, MD [facility] Electronically  Signed   By: Michaelle Birks M.D.   On: 01/17/2021 16:37   DG Chest Port 1 View  Result Date: 01/22/2021 CLINICAL DATA:  Pulmonary edema EXAM: PORTABLE CHEST 1 VIEW COMPARISON:  01/21/2021 FINDINGS: Mild improvement in right lower lobe atelectasis or infiltrate. Minimal right effusion No change in left lower lobe airspace disease and small left effusion Pulmonary vascularity normal.  Negative for edema Advanced degenerative change in both shoulders IMPRESSION: Improvement in right lower lobe airspace disease No change left lower lobe airspace disease and left effusion. Possible pneumonia. Electronically Signed   By: Franchot Gallo M.D.   On: 01/22/2021 15:44   DG Chest Port 1 View  Result Date: 01/21/2021 CLINICAL DATA:  Shortness of breath EXAM: PORTABLE CHEST 1 VIEW COMPARISON:  01/14/2021 FINDINGS: Transverse diameter of heart is increased. There are no signs of pulmonary edema. There are patchy infiltrates in both lower lung fields with interval worsening. Upper lung fields are essentially clear. There is blunting of lateral CP angles. There is no pneumothorax. IMPRESSION: Cardiomegaly. There are linear densities in the lower lung fields suggesting subsegmental atelectasis/pneumonitis with interval worsening. Small bilateral pleural effusions. Electronically Signed   By: Elmer Picker M.D.   On: 01/21/2021 13:14   DG Chest Port 1 View  Result Date: 01/14/2021 CLINICAL DATA:  Hypoxemia EXAM: PORTABLE CHEST 1 VIEW COMPARISON:  01/09/2021, 01/06/2021, 07/04/2019 FINDINGS: Suspicion of small bilateral pleural effusions. Streaky basilar opacities. Stable cardiomediastinal silhouette. No pneumothorax. IMPRESSION: Probable small pleural effusions. Streaky basilar opacities may reflect atelectasis versus mild pneumonia Electronically Signed   By: Donavan Foil M.D.   On: 01/14/2021 15:30   DG Chest Port 1 View  Result Date: 01/09/2021 CLINICAL DATA:  Dyspnea. EXAM: PORTABLE CHEST 1 VIEW COMPARISON:   January 06, 2021. FINDINGS: The heart size and mediastinal contours are within normal limits. Both lungs are clear. The visualized skeletal structures are unremarkable. IMPRESSION: No active disease. Electronically Signed   By: Marijo Conception M.D.   On: 01/09/2021 16:24   ECHOCARDIOGRAM COMPLETE  Result Date: 01/07/2021    ECHOCARDIOGRAM REPORT   Patient Name:   CHRYSTLE MURILLO Date of Exam: 01/07/2021 Medical Rec #:  [id number]     Height:       66.5 in Accession #:    [id number]    Weight:       264.3 lb Date of Birth:  October 28, 1940     BSA:          2.264 m Patient Age:    32 years      BP:           146/74 mmHg Patient Gender: F  HR:           115 bpm. Exam Location:  [facility] Procedure: 2D Echo, Color Doppler and Cardiac Doppler Indications:     I50.31 congestive heart failure-Acute Diastolic  History:         Patient has no prior history of Echocardiogram examinations.                  Risk Factors:Hypertension, Diabetes and HCL.  Sonographer:     Charmayne Sheer Referring Phys:  [id number] Gretta Cool PATEL Diagnosing Phys: Kate Sable MD  Sonographer Comments: Suboptimal parasternal window and suboptimal subcostal window. Image acquisition challenging due to patient body habitus. IMPRESSIONS  1. Left ventricular ejection fraction, by estimation, is 55%. The left ventricle has normal function. Left ventricular endocardial border not optimally defined to evaluate regional wall motion. Left ventricular diastolic parameters are indeterminate.  2. Right ventricular systolic function is normal. The right ventricular size is normal.  3. The mitral valve is normal in structure. No evidence of mitral valve regurgitation.  4. The aortic valve was not well visualized. Aortic valve regurgitation is not visualized.  5. The inferior vena cava is normal in size with greater than 50% respiratory variability, suggesting right atrial pressure of 3 mmHg. FINDINGS  Left Ventricle: Left ventricular ejection fraction, by  estimation, is 55%. The left ventricle has normal function. Left ventricular endocardial border not optimally defined to evaluate regional wall motion. The global longitudinal strain is normal despite suboptimal segment tracking. The left ventricular internal cavity size was normal in size. There is no left ventricular hypertrophy. Left ventricular diastolic parameters are indeterminate. Right Ventricle: The right ventricular size is normal. No increase in right ventricular wall thickness. Right ventricular systolic function is normal. Left Atrium: Left atrial size was normal in size. Right Atrium: Right atrial size was not well visualized. Pericardium: There is no evidence of pericardial effusion. Mitral Valve: The mitral valve is normal in structure. No evidence of mitral valve regurgitation. MV peak gradient, 13.8 mmHg. The mean mitral valve gradient is 7.0 mmHg. Tricuspid Valve: The tricuspid valve is not well visualized. Tricuspid valve regurgitation is not demonstrated. Aortic Valve: The aortic valve was not well visualized. Aortic valve regurgitation is not visualized. Aortic valve mean gradient measures 9.0 mmHg. Aortic valve peak gradient measures 15.7 mmHg. Aortic valve area, by VTI measures 2.39 cm. Pulmonic Valve: The pulmonic valve was normal in structure. Pulmonic valve regurgitation is not visualized. Aorta: The aortic root is normal in size and structure. Venous: The inferior vena cava is normal in size with greater than 50% respiratory variability, suggesting right atrial pressure of 3 mmHg. IAS/Shunts: No atrial level shunt detected by color flow Doppler.  LEFT VENTRICLE PLAX 2D LVOT diam:     2.10 cm      Diastology LV SV:         93           LV e' medial:    13.20 cm/s LV SV Index:   41           LV E/e' medial:  12.4 LVOT Area:     3.46 cm     LV e' lateral:   7.40 cm/s                             LV E/e' lateral: 22.2  LV Volumes (MOD) LV vol d, MOD A2C: 105.0 ml LV vol d, MOD A4C: 122.0 ml  LV vol s, MOD A2C: 59.9 ml LV vol s, MOD A4C: 64.8 ml LV SV MOD A2C:     45.1 ml LV SV MOD A4C:     122.0 ml LV SV MOD BP:      62.1 ml LEFT ATRIUM           Index LA Vol (A4C): 72.4 ml 31.98 ml/m  AORTIC VALVE                     PULMONIC VALVE AV Area (Vmax):    2.48 cm      PV Vmax:       1.10 m/s AV Area (Vmean):   2.36 cm      PV Vmean:      80.500 cm/s AV Area (VTI):     2.39 cm      PV VTI:        0.229 m AV Vmax:           198.00 cm/s   PV Peak grad:  4.8 mmHg AV Vmean:          142.000 cm/s  PV Mean grad:  3.0 mmHg AV VTI:            0.390 m AV Peak Grad:      15.7 mmHg AV Mean Grad:      9.0 mmHg LVOT Vmax:         142.00 cm/s LVOT Vmean:        96.700 cm/s LVOT VTI:          0.269 m LVOT/AV VTI ratio: 0.69  AORTA Ao Root diam: 2.70 cm MITRAL VALVE MV Area (PHT): 6.27 cm     SHUNTS MV Area VTI:   3.28 cm     Systemic VTI:  0.27 m MV Peak grad:  13.8 mmHg    Systemic Diam: 2.10 cm MV Mean grad:  7.0 mmHg MV Vmax:       1.86 m/s MV Vmean:      123.0 cm/s MV Decel Time: 121 msec MV E velocity: 164.20 cm/s MV A velocity: 142.00 cm/s MV E/A ratio:  1.16 Kate Sable MD Electronically signed by Kate Sable MD Signature Date/Time: 01/07/2021/5:24:31 PM    Final       CLINICAL DATA:  Worsening hypoxia.  Evaluate for possible fusion.   EXAM: CT CHEST WITHOUT CONTRAST   TECHNIQUE: Multidetector CT imaging of the chest was performed following the standard protocol without IV contrast.   COMPARISON:  Chest radiograph dated 01/14/2021.   FINDINGS: Evaluation of this exam is limited in the absence of intravenous contrast.   Cardiovascular: There is no cardiomegaly or pericardial effusion. There is coronary vascular calcification. Moderate atherosclerotic calcification of the thoracic aorta. No aneurysmal dilatation. The central pulmonary arteries are grossly unremarkable.   Mediastinum/Nodes: No hilar or mediastinal adenopathy. The esophagus is grossly unremarkable. There is a  2.8 cm exophytic right posterior thyroid nodule. In the setting of significant comorbidities or limited life expectancy, no follow-up recommended (ref: J Am Coll Radiol. 2015 Feb;12(2): 143-50).No mediastinal fluid collection.   Lungs/Pleura: There is an area of consolidation with air bronchogram involving the left lung base and a smaller area at the right lung base which may represent atelectasis or infiltrate. There is diffuse nodular densities involving the lower lobes consistent with pneumonia. No pleural effusion or pneumothorax. The central airways are patent.   Upper Abdomen: Cholecystectomy. Probable cirrhosis. There is a 1 cm left adrenal adenoma. A  4.5 cm exophytic hypodense lesion from the upper pole of the left kidney, likely a cyst.   Musculoskeletal: Osteopenia with degenerative changes of the spine. Anterior bridging osteophyte consistent with dish. No acute osseous pathology.   IMPRESSION: 1. Bilateral lower lobe pneumonia. 2. Left adrenal adenoma. 3. Aortic Atherosclerosis (ICD10-I70.0).     Electronically Signed   By: Anner Crete M.D.   On: 01/14/2021 19:56  ASSESSMENT/PLAN   Acute hypoxemic respiratory failure Present on admission due to viral pneumonia secondary to metaneumovirus -improved to 3L/min  -PT/OT -SNF placement in process    Moderate persistent asthma with acute exacerbation  - Due to viral pneumonia with metapneumovirus -patient has been on solumedrol, agree with transition to prednisone, currently finsihed with steroids -patient is with high phlegm and infiltrates -continue Duoneb as ordered    Bibasilar atelectasis     - patient with incentive spirometry      - encourage to use flutter valve      - encourage participation with PT patient was able to stand take few shuffling steps from bed to chair only.     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   Patient/Family are satisfied with care plan and all questions have been answered.  This document was prepared using Dragon voice recognition software and may include unintentional dictation errors.     Ottie Glazier, M.D.  Division of [place]

## 2021-02-05 ENCOUNTER — Encounter: Payer: Self-pay | Admitting: *Deleted

## 2021-02-05 ENCOUNTER — Other Ambulatory Visit: Payer: Self-pay

## 2021-02-05 ENCOUNTER — Emergency Department: Payer: Medicare Other

## 2021-02-05 DIAGNOSIS — S301XXA Contusion of abdominal wall, initial encounter: Secondary | ICD-10-CM | POA: Diagnosis not present

## 2021-02-05 DIAGNOSIS — Z79899 Other long term (current) drug therapy: Secondary | ICD-10-CM | POA: Insufficient documentation

## 2021-02-05 DIAGNOSIS — Z7951 Long term (current) use of inhaled steroids: Secondary | ICD-10-CM | POA: Diagnosis not present

## 2021-02-05 DIAGNOSIS — N1832 Chronic kidney disease, stage 3b: Secondary | ICD-10-CM | POA: Insufficient documentation

## 2021-02-05 DIAGNOSIS — Z7982 Long term (current) use of aspirin: Secondary | ICD-10-CM | POA: Insufficient documentation

## 2021-02-05 DIAGNOSIS — J45909 Unspecified asthma, uncomplicated: Secondary | ICD-10-CM | POA: Insufficient documentation

## 2021-02-05 DIAGNOSIS — Z7984 Long term (current) use of oral hypoglycemic drugs: Secondary | ICD-10-CM | POA: Diagnosis not present

## 2021-02-05 DIAGNOSIS — R0602 Shortness of breath: Secondary | ICD-10-CM | POA: Diagnosis not present

## 2021-02-05 DIAGNOSIS — R002 Palpitations: Secondary | ICD-10-CM | POA: Diagnosis not present

## 2021-02-05 DIAGNOSIS — L89151 Pressure ulcer of sacral region, stage 1: Secondary | ICD-10-CM | POA: Diagnosis not present

## 2021-02-05 DIAGNOSIS — J449 Chronic obstructive pulmonary disease, unspecified: Secondary | ICD-10-CM | POA: Diagnosis not present

## 2021-02-05 DIAGNOSIS — D649 Anemia, unspecified: Secondary | ICD-10-CM | POA: Diagnosis not present

## 2021-02-05 DIAGNOSIS — W1839XA Other fall on same level, initial encounter: Secondary | ICD-10-CM | POA: Diagnosis not present

## 2021-02-05 DIAGNOSIS — E1122 Type 2 diabetes mellitus with diabetic chronic kidney disease: Secondary | ICD-10-CM | POA: Diagnosis not present

## 2021-02-05 DIAGNOSIS — S3991XA Unspecified injury of abdomen, initial encounter: Secondary | ICD-10-CM | POA: Diagnosis present

## 2021-02-05 DIAGNOSIS — Z96641 Presence of right artificial hip joint: Secondary | ICD-10-CM | POA: Insufficient documentation

## 2021-02-05 DIAGNOSIS — N179 Acute kidney failure, unspecified: Secondary | ICD-10-CM | POA: Diagnosis not present

## 2021-02-05 DIAGNOSIS — I129 Hypertensive chronic kidney disease with stage 1 through stage 4 chronic kidney disease, or unspecified chronic kidney disease: Secondary | ICD-10-CM | POA: Insufficient documentation

## 2021-02-05 DIAGNOSIS — A419 Sepsis, unspecified organism: Secondary | ICD-10-CM | POA: Diagnosis not present

## 2021-02-05 LAB — BASIC METABOLIC PANEL
Anion gap: 9 (ref 5–15)
BUN: 39 mg/dL — ABNORMAL HIGH (ref 8–23)
CO2: 23 mmol/L (ref 22–32)
Calcium: 8.1 mg/dL — ABNORMAL LOW (ref 8.9–10.3)
Chloride: 106 mmol/L (ref 98–111)
Creatinine, Ser: 2.21 mg/dL — ABNORMAL HIGH (ref 0.44–1.00)
GFR, Estimated: 22 mL/min — ABNORMAL LOW (ref >60)
Glucose, Bld: 209 mg/dL — ABNORMAL HIGH (ref 70–99)
Potassium: 4 mmol/L (ref 3.5–5.1)
Sodium: 138 mmol/L (ref 135–145)

## 2021-02-05 LAB — TROPONIN I (HIGH SENSITIVITY): Troponin I (High Sensitivity): 13 ng/L (ref <18)

## 2021-02-05 LAB — CBC
HCT: 25.5 % — ABNORMAL LOW (ref 36.0–46.0)
Hemoglobin: 8.1 g/dL — ABNORMAL LOW (ref 12.0–15.0)
MCH: 29.6 pg (ref 26.0–34.0)
MCHC: 31.8 g/dL (ref 30.0–36.0)
MCV: 93.1 fL (ref 80.0–100.0)
Platelets: 368 10*3/uL (ref 150–400)
RBC: 2.74 MIL/uL — ABNORMAL LOW (ref 3.87–5.11)
RDW: 14.2 % (ref 11.5–15.5)
WBC: 6.2 10*3/uL (ref 4.0–10.5)
nRBC: 0 % (ref 0.0–0.2)

## 2021-02-05 NOTE — ED Provider Notes (Signed)
Emergency Medicine Provider Triage Evaluation Note  Brandi Hoover , a 80 y.o. female  was evaluated in triage.  Pt complains of irregular heartbeat.  She has been at her rehab facility for a couple of weeks.  Was in the hospital a couple weeks ago for respiratory failure.  Patient states she currently feels well with no chest pain or shortness of breath.  No weakness dizziness nausea or vomiting.  She was noted to have elevated heart rate and was sent via EMS to the emergency department.  Review of Systems  Positive: Tachycardia Negative: Chest pain, shortness of breath  Physical Exam  BP 139/65 (BP Location: Left Arm)    Pulse (!) 115    Temp 98.7 F (37.1 C) (Oral)    Resp 20    Ht 5\' 6"  (1.676 m)    Wt 108.9 kg    SpO2 95%    BMI 38.74 kg/m  Gen:   Awake, no distress presents in a wheelchair Resp:  Normal effort  MSK:   Moves extremities without difficulty  Other:    Medical Decision Making  Medically screening exam initiated at 6:11 PM.  Appropriate orders placed.  Brandi Hoover was informed that the remainder of the evaluation will be completed by another provider, this initial triage assessment does not replace that evaluation, and the importance of remaining in the ED until their evaluation is complete.     Brandi Guess, PA-C 02/05/21 1813    Blake Divine, MD 02/05/21 [phone]

## 2021-02-05 NOTE — ED Triage Notes (Signed)
Pt brought in via ems from [facility].  Pt sent to er for eval of irregular heart rate.  No chest pain or sob.  Pt alert  speech clear.  Fsbs 247 per ems.

## 2021-02-06 ENCOUNTER — Emergency Department
Admission: EM | Admit: 2021-02-06 | Discharge: 2021-02-06 | Disposition: A | Discharge status: Skilled Nursing Facility | Payer: Medicare Other | Attending: Emergency Medicine | Admitting: Emergency Medicine

## 2021-02-06 DIAGNOSIS — N179 Acute kidney failure, unspecified: Secondary | ICD-10-CM

## 2021-02-06 DIAGNOSIS — D649 Anemia, unspecified: Secondary | ICD-10-CM

## 2021-02-06 DIAGNOSIS — S31000A Unspecified open wound of lower back and pelvis without penetration into retroperitoneum, initial encounter: Secondary | ICD-10-CM

## 2021-02-06 DIAGNOSIS — T148XXA Other injury of unspecified body region, initial encounter: Secondary | ICD-10-CM

## 2021-02-06 DIAGNOSIS — S301XXA Contusion of abdominal wall, initial encounter: Secondary | ICD-10-CM | POA: Diagnosis not present

## 2021-02-06 LAB — TSH: TSH: 1.452 u[IU]/mL (ref 0.350–4.500)

## 2021-02-06 LAB — MAGNESIUM: Magnesium: 2.1 mg/dL (ref 1.7–2.4)

## 2021-02-06 LAB — T4, FREE: Free T4: 1.24 ng/dL — ABNORMAL HIGH (ref 0.61–1.12)

## 2021-02-06 LAB — TROPONIN I (HIGH SENSITIVITY): Troponin I (High Sensitivity): 11 ng/L (ref <18)

## 2021-02-06 MED ORDER — LACTATED RINGERS IV BOLUS
1000.0000 mL | Freq: Once | INTRAVENOUS | Status: AC
  Administered 2021-02-06: 08:00:00 1000 mL via INTRAVENOUS

## 2021-02-06 NOTE — Discharge Instructions (Addendum)
Please have kidney function rechecked in 2 to 3 days and increase daily fluid allowance to 2 L from 1500 cc.  It is vital that patient have her kidney function and hemoglobin rechecked in 2 or 3 days.  I recommend iron supplementation with over-the-counter iron pills.  It is also very important that patient be assisted by nursing staff to be turned regularly ideally every 2-4 hours to keep pressure off right sacral wound.  Please follow-up with your primary care physician for further evaluation of concerns of bradycardia.

## 2021-02-06 NOTE — ED Notes (Signed)
Called [facility] for transport to  [facility]  [phone]

## 2021-02-06 NOTE — ED Provider Notes (Signed)
[facility] Emergency Department Provider Note  ____________________________________________   Event Date/Time   First MD Initiated Contact with Patient 02/06/21 [phone]     (approximate)  I have reviewed the triage vital signs and the nursing notes.   HISTORY  Chief Complaint Irregular Heart Beat   HPI Brandi Hoover is a 80 y.o. female with below noted past medical history as well as recent admission 11/29-12/16 for acute hypoxic respiratory failure secondary to viral bronchitis and bacterial pneumonia and discharged on 2 L nasal cannula to rehab facility who presents for assessment with 2 primary concerns.  She was told that while she is doing rehab exerting herself her heart rate will drop very low into the 20s and 30s.  She states that she has no symptoms during this time including any associated chest pain, headache, earache, sore throat, headedness, dizziness, shortness of breath, cough, nausea and states she feels pretty good when she is actually working out.  She states she has none of the symptoms before after working out either.  She she does note she has some bruising on her left flank when she was dropped or 2 feet from ground-level a couple days ago but has not had any other recent injuries.  She does think there is a spot on her right buttocks that is been bothering her a little bit and is not sure if this is related to pressure or not as she does spend a fair amount of time in bed.  She currently denies any other acute associated symptoms and feels that her cough and breathing have gotten much better since discharge.  No other acute concerns at this time.  She is not on any beta-blockers.  She has never had issues with bradycardia in the past.  No recent syncopal episodes.         Past Medical History:  Diagnosis Date   Asthma    Diabetes mellitus without complication ([place])    High cholesterol    Hypertension     Patient Active Problem List    Diagnosis Date Noted   Acute respiratory failure with hypoxia ([place]) 01/07/2021   COPD (chronic obstructive pulmonary disease) ([place]) 01/06/2021   Anemia 01/06/2021   Hyperlipidemia 01/06/2021   Hypertension 01/06/2021   Multinodular goiter 01/06/2021   Type 2 diabetes mellitus with stage 3b chronic kidney disease, without long-term current use of insulin ([place]) 01/06/2021   Stage 3b chronic kidney disease ([place]) 12/25/2020   Respiratory failure with hypoxia ([place]) 07/14/2015   Pressure ulcer 07/14/2015    Past Surgical History:  Procedure Laterality Date   right hip replacement      Prior to Admission medications   Medication Sig Start Date End Date Taking? Authorizing Provider  albuterol (VENTOLIN HFA) 108 (90 Base) MCG/ACT inhaler Inhale 2 puffs into the lungs every 6 (six) hours as needed for wheezing.  07/11/15   [provider]  amLODipine (NORVASC) 10 MG tablet Take 10 mg by mouth daily.    [provider]  ascorbic acid (VITAMIN C) 1000 MG tablet Take 1,000 mg by mouth daily.    [provider]  aspirin EC 81 MG tablet Take 81 mg by mouth daily.    [provider]  benazepril (LOTENSIN) 20 MG tablet Take 20 mg by mouth 2 (two) times daily.     [provider]  Calcium Carbonate-Vitamin D 600-400 MG-UNIT tablet Take 1 tablet by mouth at bedtime.    [provider]  docusate sodium (COLACE) 100 MG capsule Take 1 capsule (100 mg total) by mouth 2 (two) times daily. 07/18/15   Gladstone Lighter, MD  Fluticasone-Salmeterol (ADVAIR) 250-50 MCG/DOSE AEPB Inhale 1 puff into the lungs 2 (two) times daily.    [provider]  glipiZIDE (GLUCOTROL) 10 MG tablet Take 10 mg by mouth 2 (two) times daily before a meal.    [provider]  guaiFENesin (MUCINEX) 600 MG 12 hr tablet Take 1 tablet (600 mg total) by mouth 2 (two) times daily. 07/18/15   Gladstone Lighter, MD  guaiFENesin-codeine 100-10 MG/5ML syrup Take 5 mLs by mouth  at bedtime as needed for cough. 07/18/15   Gladstone Lighter, MD  hydrochlorothiazide (HYDRODIURIL) 12.5 MG tablet Take 12.5 mg by mouth 2 (two) times daily. 11/20/20   [provider]  ipratropium-albuterol (DUONEB) 0.5-2.5 (3) MG/3ML SOLN Take 3 mLs by nebulization every 6 (six) hours as needed (wheezing, shortness of breath). 07/18/15   Gladstone Lighter, MD  metFORMIN (GLUCOPHAGE) 500 MG tablet Take 1,000-1,500 mg by mouth 2 (two) times daily with a meal. 1500 mg in the morning and 1000 mg in the evening    [provider]  Multiple Vitamin (MULTIVITAMIN WITH MINERALS) TABS tablet Take 1 tablet by mouth daily.    [provider]  phenol-menthol (CEPASTAT) 14.5 MG lozenge Place 1 lozenge inside cheek as needed for sore throat. 07/18/15   Gladstone Lighter, MD  polyethylene glycol (MIRALAX / GLYCOLAX) packet Take 17 g by mouth daily. HOLD for > 2 bowel movements/day 07/18/15   Gladstone Lighter, MD  simvastatin (ZOCOR) 20 MG tablet Take 20 mg by mouth at bedtime.    [provider]  traMADol (ULTRAM) 50 MG tablet Take 1 tablet (50 mg total) by mouth every 6 (six) hours as needed. Patient not taking: Reported on 01/07/2021 07/05/19   Carrie Mew, MD  vitamin B-12 (CYANOCOBALAMIN) 1000 MCG tablet Take 1,000 mcg by mouth daily.    [provider]    Allergies Mometasone furo-formoterol fum, Azithromycin, Gabapentin, Omeprazole, Other, and Ramipril  Family History  Problem Relation Age of Onset   COPD Father    Stroke Mother     Social History Social History   Tobacco Use   Smoking status: Never   Smokeless tobacco: Never  Substance Use Topics   Alcohol use: No   Drug use: No    Review of Systems  Review of Systems  Constitutional:  Negative for chills and fever.  HENT:  Negative for sore throat.   Eyes:  Negative for pain.  Respiratory:  Positive for cough (improving) and shortness of breath (improving). Negative for stridor.    Cardiovascular:  Positive for palpitations. Negative for chest pain.  Gastrointestinal:  Negative for vomiting.  Musculoskeletal:  Positive for myalgias (L flank, R buttock).  Skin:  Negative for rash.  Neurological:  Negative for seizures, loss of consciousness and headaches.  Endo/Heme/Allergies:  Bruises/bleeds easily.  Psychiatric/Behavioral:  Negative for suicidal ideas.   All other systems reviewed and are negative.    ____________________________________________   PHYSICAL EXAM:  VITAL SIGNS: ED Triage Vitals  Enc Vitals Group     BP 02/05/21 1800 139/65     Pulse Rate 02/05/21 1800 (!) 115     Resp 02/05/21 1800 20     Temp 02/05/21 1800 98.7 F (37.1 C)     Temp Source 02/05/21 1800 Oral     SpO2 02/05/21 1800 95 %     Weight 02/05/21 1757  240 lb (108.9 kg)     Height 02/05/21 1757 5\' 6"  (1.676 m)     Head Circumference --      Peak Flow --      Pain Score 02/05/21 1757 0     Pain Loc --      Pain Edu? --      Excl. in [place]? --    Vitals:   02/06/21 0111 02/06/21 0723  BP: (!) 134/59 (!) 142/52  Pulse: 94 98  Resp: 18 18  Temp:    SpO2: 96% 100%   Physical Exam Vitals and nursing note reviewed.  Constitutional:      General: She is not in acute distress.    Appearance: She is well-developed. She is obese.  HENT:     Head: Normocephalic and atraumatic.     Right Ear: External ear normal.     Left Ear: External ear normal.     Nose: Nose normal.     Mouth/Throat:     Mouth: Mucous membranes are dry.  Eyes:     Conjunctiva/sclera: Conjunctivae normal.  Cardiovascular:     Rate and Rhythm: Normal rate and regular rhythm.     Heart sounds: No murmur heard. Pulmonary:     Effort: Pulmonary effort is normal. No respiratory distress.     Breath sounds: Normal breath sounds.  Abdominal:     Palpations: Abdomen is soft.     Tenderness: There is no abdominal tenderness.  Musculoskeletal:        General: No swelling.     Cervical back: Neck supple.   Skin:    General: Skin is warm and dry.     Capillary Refill: Capillary refill takes 2 to 3 seconds.  Neurological:     Mental Status: She is alert.  Psychiatric:        Mood and Affect: Mood normal.    Buttock shows stage I sacral decubitus injury without surrounding induration purulent drainage bleeding fluctuance or other evidence of infection.  There is some ecchymosis over the left flank without significant surrounding tenderness and abdomen is soft.  No point tenderness along the C/T/L-spine.  Patient has symmetric strength in her extremities.  2+ radial pulse. ____________________________________________   LABS (all labs ordered are listed, but only abnormal results are displayed)  Labs Reviewed  BASIC METABOLIC PANEL - Abnormal; Notable for the following components:      Result Value   Glucose, Bld 209 (*)    BUN 39 (*)    Creatinine, Ser 2.21 (*)    Calcium 8.1 (*)    GFR, Estimated 22 (*)    All other components within normal limits  CBC - Abnormal; Notable for the following components:   RBC 2.74 (*)    Hemoglobin 8.1 (*)    HCT 25.5 (*)    All other components within normal limits  T4, FREE - Abnormal; Notable for the following components:   Free T4 1.24 (*)    All other components within normal limits  TSH  MAGNESIUM  TROPONIN I (HIGH SENSITIVITY)  TROPONIN I (HIGH SENSITIVITY)   ____________________________________________  EKG  ECG remarkable sinus tachycardia with PAC, nonspecific ST change in V2 without other evidence of ischemia.  Otherwise unremarkable intervals and no other significant arrhythmia. ____________________________________________  RADIOLOGY  ED MD interpretation: Chest x-ray shows some scarring at the lung bases and blunting of the costophrenic angles bilaterally without evidence of pneumothorax, focal consolidation, effusion, edema or other clear acute thoracic process.  Official radiology report(s): DG Chest 2 View  Result Date:  02/05/2021 CLINICAL DATA:  Irregular heart rate. EXAM: CHEST - 2 VIEW COMPARISON:  01/22/2021 FINDINGS: COPD with hyperinflation lungs. Mild bibasilar airspace disease. Improved aeration left lung base since the prior study. Decreased left effusion. Probable scarring or atelectasis. Negative for pneumonia or effusion. IMPRESSION: Bibasilar atelectasis/scarring.  Improved aeration left lung base. Electronically Signed   By: Franchot Gallo M.D.   On: 02/05/2021 18:46    ____________________________________________   PROCEDURES  Procedure(s) performed (including Critical Care):  Procedures   ____________________________________________   INITIAL IMPRESSION / ASSESSMENT AND PLAN / ED COURSE      Patient presents with above-stated history exam for assessment of a couple concerns.  First she was told that over the last couple days her heart rate will go quite low while she was exerting herself.  She seems to be completely asymptomatic during these episodes and has not been bradycardic over almost 15 hours in emergency room.  She is not on any beta-blockers and her only listed calcium channel blocker is amlodipine which I do not believe would cause exertional bradycardia.  She does not appear to be in myxedema coma.  And is not hypothermic.  Unclear etiology for this although there is no evidence of an unstable arrhythmia at present.  No associated syncopal episodes or symptoms associated symptoms per history.  Chest x-ray shows some scarring at the lung bases and blunting of the costophrenic angles bilaterally without evidence of pneumothorax, focal consolidation, effusion, edema or other clear acute thoracic process.  ECG remarkable sinus tachycardia with PAC, nonspecific ST change in V2 without other evidence of ischemia.  Otherwise unremarkable intervals and no other significant arrhythmia.  ECG and nonelevated troponin x2 are not suggestive of ACS or myocarditis.  BMP remarkable for evidence  of slight hyperglycemia with a glucose of 209 and AKI with a creatinine of 2.21 compared to 1.32 weeks ago.  Patient does note she was discharged with a fluid restriction of 1500 cc/day which I confirmed on review of records although it seems patient has no history of heart failure.  She does appear little dehydrated on exam I think this may be from some mild dehydration.  Will hydrate with IV fluids and instructed patient and daughter that they should have patient's kidney function rechecked in 2 or 3 days and will increase her fluids and strict 2 L daily.  CBC is remarkable for no leukocytosis and anemia with hemoglobin 8.1 compared to 9.62 weeks ago.  It is certainly possible this is from bruising on patient's left flank versus malnutrition as patient states she has had very poor appetite over the last 2 weeks.  Have a low suspicion for active bleeding at this time given stable vitals I do not believe she requires full trauma pan scans.  Discussed importance of having hemoglobin rechecked in 2 or 3 days and supplementing with over-the-counter iron.  TSH is within normal limits.  And free T4 is just at the upper limit of normal.  No indication for further evaluation emergency room.  Magnesium is unremarkable.  Overall unclear etiology for reported bradycardia although given patient is asymptomatic during this time and has not had any bradycardia over 15 hours in the emergency room and there is no associated syncopal episodes and is not on medications that could likely cause this I think she is stable for discharge with continued outpatient follow-up.  Discharged in stable condition.  Strict return cautions advised and discussed.  ____________________________________________   FINAL CLINICAL IMPRESSION(S) / ED DIAGNOSES  Final diagnoses:  Bruising  AKI (acute kidney injury) ([place])  Wound of sacral region, initial encounter  Low hemoglobin    Medications  lactated ringers bolus 1,000 mL  (1,000 mLs Intravenous New Bag/Given 02/06/21 0815)     ED Discharge Orders     None        Note:  This document was prepared using Dragon voice recognition software and may include unintentional dictation errors.    Lucrezia Starch, MD 02/06/21 [phone]

## 2021-02-06 NOTE — ED Notes (Signed)
Pressure dressing applied to sacral area.

## 2021-02-06 NOTE — ED Notes (Signed)
Pt states has been in facility for rehab for ambulation and "breathing". States she did have a fall last week which she was evaluated for and xrays were negative.Pt sent here from facility for bradycardia during rehab. Pt denies hx of the same, also co left back soreness due to fall.

## 2021-02-06 NOTE — ED Notes (Signed)
Pt awaiting EMS transport back to [facility].

## 2021-02-11 ENCOUNTER — Inpatient Hospital Stay
Admission: EM | Admit: 2021-02-11 | Discharge: 2021-03-11 | DRG: 871 | Disposition: E | Payer: Medicare Other | Source: Skilled Nursing Facility | Attending: Pulmonary Disease | Admitting: Pulmonary Disease

## 2021-02-11 ENCOUNTER — Encounter: Payer: Self-pay | Admitting: Emergency Medicine

## 2021-02-11 ENCOUNTER — Other Ambulatory Visit: Payer: Self-pay

## 2021-02-11 ENCOUNTER — Emergency Department: Payer: Medicare Other

## 2021-02-11 DIAGNOSIS — J440 Chronic obstructive pulmonary disease with acute lower respiratory infection: Secondary | ICD-10-CM | POA: Diagnosis present

## 2021-02-11 DIAGNOSIS — R0602 Shortness of breath: Secondary | ICD-10-CM | POA: Diagnosis present

## 2021-02-11 DIAGNOSIS — Z4659 Encounter for fitting and adjustment of other gastrointestinal appliance and device: Secondary | ICD-10-CM

## 2021-02-11 DIAGNOSIS — R6520 Severe sepsis without septic shock: Secondary | ICD-10-CM

## 2021-02-11 DIAGNOSIS — I700 Atherosclerosis of aorta: Secondary | ICD-10-CM | POA: Diagnosis present

## 2021-02-11 DIAGNOSIS — I129 Hypertensive chronic kidney disease with stage 1 through stage 4 chronic kidney disease, or unspecified chronic kidney disease: Secondary | ICD-10-CM | POA: Diagnosis present

## 2021-02-11 DIAGNOSIS — I248 Other forms of acute ischemic heart disease: Secondary | ICD-10-CM | POA: Diagnosis present

## 2021-02-11 DIAGNOSIS — N170 Acute kidney failure with tubular necrosis: Secondary | ICD-10-CM | POA: Diagnosis present

## 2021-02-11 DIAGNOSIS — I4892 Unspecified atrial flutter: Secondary | ICD-10-CM | POA: Diagnosis present

## 2021-02-11 DIAGNOSIS — Z01818 Encounter for other preprocedural examination: Secondary | ICD-10-CM | POA: Diagnosis not present

## 2021-02-11 DIAGNOSIS — E7800 Pure hypercholesterolemia, unspecified: Secondary | ICD-10-CM | POA: Diagnosis present

## 2021-02-11 DIAGNOSIS — J123 Human metapneumovirus pneumonia: Secondary | ICD-10-CM | POA: Diagnosis present

## 2021-02-11 DIAGNOSIS — Z825 Family history of asthma and other chronic lower respiratory diseases: Secondary | ICD-10-CM

## 2021-02-11 DIAGNOSIS — E119 Type 2 diabetes mellitus without complications: Secondary | ICD-10-CM

## 2021-02-11 DIAGNOSIS — J1008 Influenza due to other identified influenza virus with other specified pneumonia: Secondary | ICD-10-CM | POA: Diagnosis present

## 2021-02-11 DIAGNOSIS — Z452 Encounter for adjustment and management of vascular access device: Secondary | ICD-10-CM

## 2021-02-11 DIAGNOSIS — J1000 Influenza due to other identified influenza virus with unspecified type of pneumonia: Secondary | ICD-10-CM | POA: Diagnosis present

## 2021-02-11 DIAGNOSIS — Z66 Do not resuscitate: Secondary | ICD-10-CM | POA: Diagnosis not present

## 2021-02-11 DIAGNOSIS — R6521 Severe sepsis with septic shock: Secondary | ICD-10-CM | POA: Diagnosis not present

## 2021-02-11 DIAGNOSIS — S301XXA Contusion of abdominal wall, initial encounter: Secondary | ICD-10-CM | POA: Diagnosis not present

## 2021-02-11 DIAGNOSIS — Z79899 Other long term (current) drug therapy: Secondary | ICD-10-CM

## 2021-02-11 DIAGNOSIS — Z20822 Contact with and (suspected) exposure to covid-19: Secondary | ICD-10-CM | POA: Diagnosis present

## 2021-02-11 DIAGNOSIS — J449 Chronic obstructive pulmonary disease, unspecified: Secondary | ICD-10-CM | POA: Diagnosis present

## 2021-02-11 DIAGNOSIS — J101 Influenza due to other identified influenza virus with other respiratory manifestations: Secondary | ICD-10-CM | POA: Diagnosis not present

## 2021-02-11 DIAGNOSIS — N179 Acute kidney failure, unspecified: Secondary | ICD-10-CM | POA: Diagnosis not present

## 2021-02-11 DIAGNOSIS — L8990 Pressure ulcer of unspecified site, unspecified stage: Secondary | ICD-10-CM | POA: Diagnosis present

## 2021-02-11 DIAGNOSIS — A419 Sepsis, unspecified organism: Secondary | ICD-10-CM | POA: Diagnosis present

## 2021-02-11 DIAGNOSIS — R0600 Dyspnea, unspecified: Secondary | ICD-10-CM

## 2021-02-11 DIAGNOSIS — J9622 Acute and chronic respiratory failure with hypercapnia: Secondary | ICD-10-CM | POA: Diagnosis present

## 2021-02-11 DIAGNOSIS — R011 Cardiac murmur, unspecified: Secondary | ICD-10-CM | POA: Diagnosis present

## 2021-02-11 DIAGNOSIS — D631 Anemia in chronic kidney disease: Secondary | ICD-10-CM | POA: Diagnosis present

## 2021-02-11 DIAGNOSIS — J9621 Acute and chronic respiratory failure with hypoxia: Secondary | ICD-10-CM | POA: Diagnosis present

## 2021-02-11 DIAGNOSIS — G9341 Metabolic encephalopathy: Secondary | ICD-10-CM | POA: Diagnosis present

## 2021-02-11 DIAGNOSIS — E1122 Type 2 diabetes mellitus with diabetic chronic kidney disease: Secondary | ICD-10-CM | POA: Diagnosis present

## 2021-02-11 DIAGNOSIS — Z9911 Dependence on respirator [ventilator] status: Secondary | ICD-10-CM

## 2021-02-11 DIAGNOSIS — I441 Atrioventricular block, second degree: Secondary | ICD-10-CM | POA: Diagnosis present

## 2021-02-11 DIAGNOSIS — Z7984 Long term (current) use of oral hypoglycemic drugs: Secondary | ICD-10-CM

## 2021-02-11 DIAGNOSIS — E874 Mixed disorder of acid-base balance: Secondary | ICD-10-CM | POA: Diagnosis not present

## 2021-02-11 DIAGNOSIS — Z96641 Presence of right artificial hip joint: Secondary | ICD-10-CM | POA: Diagnosis present

## 2021-02-11 DIAGNOSIS — Z881 Allergy status to other antibiotic agents status: Secondary | ICD-10-CM

## 2021-02-11 DIAGNOSIS — J9811 Atelectasis: Secondary | ICD-10-CM | POA: Diagnosis present

## 2021-02-11 DIAGNOSIS — Z515 Encounter for palliative care: Secondary | ICD-10-CM | POA: Diagnosis not present

## 2021-02-11 DIAGNOSIS — R579 Shock, unspecified: Secondary | ICD-10-CM

## 2021-02-11 DIAGNOSIS — I10 Essential (primary) hypertension: Secondary | ICD-10-CM | POA: Diagnosis present

## 2021-02-11 DIAGNOSIS — N1832 Chronic kidney disease, stage 3b: Secondary | ICD-10-CM | POA: Diagnosis present

## 2021-02-11 DIAGNOSIS — D649 Anemia, unspecified: Secondary | ICD-10-CM

## 2021-02-11 DIAGNOSIS — I4891 Unspecified atrial fibrillation: Secondary | ICD-10-CM | POA: Diagnosis present

## 2021-02-11 DIAGNOSIS — J9601 Acute respiratory failure with hypoxia: Secondary | ICD-10-CM | POA: Diagnosis not present

## 2021-02-11 DIAGNOSIS — R600 Localized edema: Secondary | ICD-10-CM | POA: Diagnosis present

## 2021-02-11 DIAGNOSIS — J690 Pneumonitis due to inhalation of food and vomit: Secondary | ICD-10-CM | POA: Diagnosis present

## 2021-02-11 DIAGNOSIS — Z978 Presence of other specified devices: Secondary | ICD-10-CM

## 2021-02-11 DIAGNOSIS — I1310 Hypertensive heart and chronic kidney disease without heart failure, with stage 1 through stage 4 chronic kidney disease, or unspecified chronic kidney disease: Secondary | ICD-10-CM | POA: Diagnosis present

## 2021-02-11 DIAGNOSIS — R778 Other specified abnormalities of plasma proteins: Secondary | ICD-10-CM

## 2021-02-11 DIAGNOSIS — Z7982 Long term (current) use of aspirin: Secondary | ICD-10-CM

## 2021-02-11 DIAGNOSIS — Z888 Allergy status to other drugs, medicaments and biological substances status: Secondary | ICD-10-CM

## 2021-02-11 DIAGNOSIS — Z7951 Long term (current) use of inhaled steroids: Secondary | ICD-10-CM

## 2021-02-11 DIAGNOSIS — D508 Other iron deficiency anemias: Secondary | ICD-10-CM | POA: Diagnosis not present

## 2021-02-11 LAB — BASIC METABOLIC PANEL
Anion gap: 7 (ref 5–15)
BUN: 30 mg/dL — ABNORMAL HIGH (ref 8–23)
CO2: 22 mmol/L (ref 22–32)
Calcium: 7.9 mg/dL — ABNORMAL LOW (ref 8.9–10.3)
Chloride: 109 mmol/L (ref 98–111)
Creatinine, Ser: 2.06 mg/dL — ABNORMAL HIGH (ref 0.44–1.00)
GFR, Estimated: 24 mL/min — ABNORMAL LOW (ref >60)
Glucose, Bld: 144 mg/dL — ABNORMAL HIGH (ref 70–99)
Potassium: 4.1 mmol/L (ref 3.5–5.1)
Sodium: 138 mmol/L (ref 135–145)

## 2021-02-11 LAB — CBC
HCT: 25.8 % — ABNORMAL LOW (ref 36.0–46.0)
Hemoglobin: 8 g/dL — ABNORMAL LOW (ref 12.0–15.0)
MCH: 29.1 pg (ref 26.0–34.0)
MCHC: 31 g/dL (ref 30.0–36.0)
MCV: 93.8 fL (ref 80.0–100.0)
Platelets: 430 10*3/uL — ABNORMAL HIGH (ref 150–400)
RBC: 2.75 MIL/uL — ABNORMAL LOW (ref 3.87–5.11)
RDW: 14.8 % (ref 11.5–15.5)
WBC: 7.5 10*3/uL (ref 4.0–10.5)
nRBC: 0 % (ref 0.0–0.2)

## 2021-02-11 MED ORDER — DILTIAZEM HCL 25 MG/5ML IV SOLN
10.0000 mg | Freq: Once | INTRAVENOUS | Status: AC
  Administered 2021-02-11: 10 mg via INTRAVENOUS
  Filled 2021-02-11: qty 5

## 2021-02-11 MED ORDER — SODIUM CHLORIDE 0.9 % IV BOLUS
500.0000 mL | Freq: Once | INTRAVENOUS | Status: AC
  Administered 2021-02-11: 500 mL via INTRAVENOUS

## 2021-02-11 MED ORDER — AMIODARONE HCL IN DEXTROSE 360-4.14 MG/200ML-% IV SOLN
60.0000 mg/h | INTRAVENOUS | Status: AC
  Administered 2021-02-11: 60 mg/h via INTRAVENOUS
  Filled 2021-02-11: qty 200

## 2021-02-11 MED ORDER — AMIODARONE HCL IN DEXTROSE 360-4.14 MG/200ML-% IV SOLN
60.0000 mg/h | INTRAVENOUS | Status: DC
  Administered 2021-02-12: 60 mg/h via INTRAVENOUS
  Administered 2021-02-12: 40 mg/h via INTRAVENOUS
  Administered 2021-02-12: 30 mg/h via INTRAVENOUS
  Administered 2021-02-13 (×3): 60 mg/h via INTRAVENOUS
  Filled 2021-02-11 (×6): qty 200

## 2021-02-11 MED ORDER — DILTIAZEM HCL-DEXTROSE 125-5 MG/125ML-% IV SOLN (PREMIX): 5.0000 mg/h | INTRAVENOUS | Status: DC

## 2021-02-11 NOTE — ED Provider Triage Note (Signed)
Emergency Medicine Provider Triage Evaluation Note  Brandi Hoover, a 81 y.o. female  was evaluated in triage.  Pt complains of shortness of breath and tachycardia.  Patient presents to the ED via EMS from [facility] rehab, with complaints of tachycardia with her most recent vital sign check.  Patient reports feeling fine although she does endorse some intermittently productive cough.  Cording to EMS report, patient had EKG tracing consistent with A. fib with heart rate in the 180s.  Review of Systems  Positive: Tachycardia, SOB, cough Negative: FCS  Physical Exam  Ht 5\' 6"  (1.676 m)    Wt 108.9 kg    SpO2 95%    BMI 38.75 kg/m  Gen:   Awake, no distress   Resp:  Normal effort intermittent cough MSK:   Moves extremities without difficulty  Other:  CVS: tachy rate  Medical Decision Making  Medically screening exam initiated at 8:38 PM.  Appropriate orders placed.  Brandi Hoover was informed that the remainder of the evaluation will be completed by another provider, this initial triage assessment does not replace that evaluation, and the importance of remaining in the ED until their evaluation is complete.  Geriatric patient presenting to the ED from her facility.  Patient presents with intermittent cough as well as some shortness of breath.  Patient was found to be in A. fib according to EMS report.   Brandi Needles, PA-C 02/16/2021 2041

## 2021-02-11 NOTE — ED Provider Notes (Signed)
[facility] Provider Note    Event Date/Time   First MD Initiated Contact with Patient 02/26/2021 2113     (approximate)   History   Tachycardia   HPI  Brandi Hoover is a 81 y.o. female  who, according to discharge summary dated 01/23/21 had admission for respiratory failure with metapneumoviras pneumonia and history of COPD and anemia, who presents to the emergency department today because of elevated heart rate.  Patient states that this was found on routine vital sign evaluation at her rehab facility.  The patient denies any chest pain or feeling of palpitations.  She denies any history of atrial fibrillation.  She denies any increased shortness of breath. No recent fevers.      Physical Exam   Triage Vital Signs: ED Triage Vitals  Enc Vitals Group     BP 03/03/2021 2040 119/72     Pulse Rate 02/19/2021 2040 (!) 145     Resp 02/18/2021 2040 20     Temp 02/08/2021 2040 98.9 F (37.2 C)     Temp src --      SpO2 02/10/2021 2033 95 %     Weight 02/16/2021 2037 240 lb 1.3 oz (108.9 kg)     Height 03/09/2021 2037 5\' 6"  (1.676 m)     Head Circumference --      Peak Flow --      Pain Score 02/20/2021 2037 0     Pain Loc --      Pain Edu? --      Excl. in [place]? --     Most recent vital signs: Vitals:   02/25/2021 2033 03/03/2021 2040  BP:  119/72  Pulse:  (!) 145  Resp:  20  Temp:  98.9 F (37.2 C)  SpO2: 95% 98%   General: Awake, no distress.  CV:  Good peripheral perfusion. Tachycardia, irregular rhythm. Resp:  Normal effort.  Abd:  No distention.   ED Results / Procedures / Treatments   Labs (all labs ordered are listed, but only abnormal results are displayed) Labs Reviewed  CBC - Abnormal; Notable for the following components:      Result Value   RBC 2.75 (*)    Hemoglobin 8.0 (*)    HCT 25.8 (*)    Platelets 430 (*)    All other components within normal limits  BASIC METABOLIC PANEL     EKG  I, Nance Pear, attending physician,  personally viewed and interpreted this EKG  EKG Time: 2039 Rate: 141 Rhythm: atrial fibrillation with RVR Axis: left axis deviation Intervals: qtc 496 QRS: narrow ST changes: no st elevation Impression: abnormal ekg    RADIOLOGY CXR: My interpretation: No pneumonia. No pneumothorax Radiologist interpretation: IMPRESSION:  1.  Bilateral trace pleural effusion.  2.  Aortic Atherosclerosis (ICD10-I70.0).     PROCEDURES:  Critical Care performed: Yes, see critical care procedure note(s)  Procedures  CRITICAL CARE Performed by: Nance Pear   Total critical care time: 35 minutes  Critical care time was exclusive of separately billable procedures and treating other patients.  Critical care was necessary to treat or prevent imminent or life-threatening deterioration.  Critical care was time spent personally by me on the following activities: development of treatment plan with patient and/or surrogate as well as nursing, discussions with consultants, evaluation of patient's response to treatment, examination of patient, obtaining history from patient or surrogate, ordering and performing treatments and interventions, ordering and review of laboratory studies, ordering and review  of radiographic studies, pulse oximetry and re-evaluation of patient's condition.   MEDICATIONS ORDERED IN ED: Medications - No data to display   IMPRESSION / MDM / [place] / ED COURSE  I reviewed the triage vital signs and the nursing notes.                              Differential diagnosis includes, but is not limited to, sinus tachycardia, ventricle tachycardia, atrial fibrillation/flutter.  The patient was placed on on the cardiac monitor to evaluate for significant heart rate/rhythm changes.  Patient presented to the emergency department because of concerns for elevated heart rate found during routine vital sign check at rehab facility.  EKG here is consistent with atrial  fibrillation with RVR.  Patient denies history of the same.  Discussed this finding with the patient.  Initially tried bolus of diltiazem.  While this did not significantly change her heart rate it did lower her blood pressure.  Because of continued low blood pressure I did have concerns about further diltiazem.  Discussed with Dr. Aundra Dubin with cardiology.  This time will place patient on amnio infusion.  Discussed with the hospitalist and hospitalist will plan on admission.    FINAL CLINICAL IMPRESSION(S) / ED DIAGNOSES   Final diagnoses:  Atrial fibrillation with RVR ([place])     Note:  This document was prepared using Dragon voice recognition software and may include unintentional dictation errors.     Nance Pear, MD 02/12/21 [phone]

## 2021-02-11 NOTE — ED Triage Notes (Signed)
EMS brings pt in from [facility] for c/o SHOB, afib (186HR, 96/58BP)

## 2021-02-11 NOTE — ED Triage Notes (Signed)
Pt BIB EMS from [facility] rehab with c/o tachycardia while her VS was being checked at the facility. Pt states she feels fine and doesn't feel like her HR is high right now.

## 2021-02-12 DIAGNOSIS — N179 Acute kidney failure, unspecified: Secondary | ICD-10-CM

## 2021-02-12 DIAGNOSIS — N1832 Chronic kidney disease, stage 3b: Secondary | ICD-10-CM

## 2021-02-12 DIAGNOSIS — J101 Influenza due to other identified influenza virus with other respiratory manifestations: Secondary | ICD-10-CM

## 2021-02-12 DIAGNOSIS — D508 Other iron deficiency anemias: Secondary | ICD-10-CM

## 2021-02-12 LAB — TROPONIN I (HIGH SENSITIVITY): Troponin I (High Sensitivity): 36 ng/L — ABNORMAL HIGH (ref <18)

## 2021-02-12 LAB — APTT: aPTT: 65 seconds — ABNORMAL HIGH (ref 24–36)

## 2021-02-12 LAB — CBG MONITORING, ED
Glucose-Capillary: 133 mg/dL — ABNORMAL HIGH (ref 70–99)
Glucose-Capillary: 117 mg/dL — ABNORMAL HIGH (ref 70–99)
Glucose-Capillary: 146 mg/dL — ABNORMAL HIGH (ref 70–99)
Glucose-Capillary: 178 mg/dL — ABNORMAL HIGH (ref 70–99)

## 2021-02-12 LAB — IRON AND TIBC
Iron: 24 ug/dL — ABNORMAL LOW (ref 28–170)
Saturation Ratios: 11 % (ref 10.4–31.8)
TIBC: 214 ug/dL — ABNORMAL LOW (ref 250–450)
UIBC: 190 ug/dL

## 2021-02-12 LAB — RESP PANEL BY RT-PCR (FLU A&B, COVID) ARPGX2
Influenza A by PCR: POSITIVE — AB
Influenza B by PCR: NEGATIVE
SARS Coronavirus 2 by RT PCR: NEGATIVE

## 2021-02-12 LAB — PROTIME-INR
INR: 1.2 (ref 0.8–1.2)
Prothrombin Time: 14.7 seconds (ref 11.4–15.2)

## 2021-02-12 LAB — RETICULOCYTES
Immature Retic Fract: 26.1 % — ABNORMAL HIGH (ref 2.3–15.9)
RBC.: 2.91 MIL/uL — ABNORMAL LOW (ref 3.87–5.11)
Retic Count, Absolute: 74.5 10*3/uL (ref 19.0–186.0)
Retic Ct Pct: 2.6 % (ref 0.4–3.1)

## 2021-02-12 LAB — VITAMIN B12: Vitamin B-12: 581 pg/mL (ref 180–914)

## 2021-02-12 LAB — FERRITIN: Ferritin: 113 ng/mL (ref 11–307)

## 2021-02-12 LAB — FOLATE: Folate: 52.8 ng/mL (ref >5.9)

## 2021-02-12 MED ORDER — METOPROLOL TARTRATE 25 MG PO TABS: 25.0000 mg | ORAL_TABLET | Freq: Two times a day (BID) | ORAL | Status: DC

## 2021-02-12 MED ORDER — METOPROLOL TARTRATE 25 MG PO TABS
25.0000 mg | ORAL_TABLET | Freq: Four times a day (QID) | ORAL | Status: DC
  Administered 2021-02-12 (×2): 25 mg via ORAL
  Filled 2021-02-12 (×3): qty 1

## 2021-02-12 MED ORDER — DILTIAZEM HCL-DEXTROSE 125-5 MG/125ML-% IV SOLN (PREMIX)
5.0000 mg/h | INTRAVENOUS | Status: DC
  Administered 2021-02-12: 15 mg/h via INTRAVENOUS
  Administered 2021-02-12: 5 mg/h via INTRAVENOUS
  Administered 2021-02-13: 15 mg/h via INTRAVENOUS
  Filled 2021-02-12 (×3): qty 125

## 2021-02-12 MED ORDER — OSELTAMIVIR PHOSPHATE 30 MG PO CAPS: 30.0000 mg | ORAL_CAPSULE | Freq: Two times a day (BID) | ORAL | Status: DC

## 2021-02-12 MED ORDER — HEPARIN (PORCINE) 25000 UT/250ML-% IV SOLN
1150.0000 [IU]/h | INTRAVENOUS | Status: DC
  Administered 2021-02-12: 17:00:00 1100 [IU]/h via INTRAVENOUS
  Administered 2021-02-13 (×2): 1250 [IU]/h via INTRAVENOUS
  Filled 2021-02-12 (×2): qty 250

## 2021-02-12 MED ORDER — INSULIN ASPART 100 UNIT/ML IJ SOLN
0.0000 [IU] | Freq: Three times a day (TID) | INTRAMUSCULAR | Status: DC
  Administered 2021-02-12: 2 [IU] via SUBCUTANEOUS
  Administered 2021-02-13: 5 [IU] via SUBCUTANEOUS
  Administered 2021-02-13 (×2): 3 [IU] via SUBCUTANEOUS
  Filled 2021-02-12 (×4): qty 1

## 2021-02-12 MED ORDER — DIGOXIN 0.25 MG/ML IJ SOLN
0.2500 mg | Freq: Once | INTRAMUSCULAR | Status: AC
  Administered 2021-02-12: 0.25 mg via INTRAVENOUS
  Filled 2021-02-12: qty 2

## 2021-02-12 MED ORDER — ONDANSETRON HCL 4 MG/2ML IJ SOLN: 4.0000 mg | Freq: Four times a day (QID) | INTRAMUSCULAR | Status: DC | PRN

## 2021-02-12 MED ORDER — OSELTAMIVIR PHOSPHATE 30 MG PO CAPS
30.0000 mg | ORAL_CAPSULE | Freq: Every day | ORAL | Status: DC
  Administered 2021-02-12: 30 mg via ORAL
  Filled 2021-02-12 (×3): qty 1

## 2021-02-12 MED ORDER — INSULIN ASPART 100 UNIT/ML IJ SOLN: 0.0000 [IU] | Freq: Every day | INTRAMUSCULAR | Status: DC

## 2021-02-12 MED ORDER — HEPARIN (PORCINE) 25000 UT/250ML-% IV SOLN: 1250.0000 [IU]/h | INTRAVENOUS | Status: DC

## 2021-02-12 MED ORDER — METOPROLOL TARTRATE 5 MG/5ML IV SOLN: 5.0000 mg | INTRAVENOUS | Status: DC | PRN

## 2021-02-12 MED ORDER — ACETAMINOPHEN 325 MG PO TABS: 650.0000 mg | ORAL_TABLET | ORAL | Status: DC | PRN

## 2021-02-12 MED ORDER — HEPARIN BOLUS VIA INFUSION
4000.0000 [IU] | Freq: Once | INTRAVENOUS | Status: AC
  Administered 2021-02-12: 4000 [IU] via INTRAVENOUS
  Filled 2021-02-12: qty 4000

## 2021-02-12 MED ORDER — HEPARIN BOLUS VIA INFUSION
4500.0000 [IU] | Freq: Once | INTRAVENOUS | Status: DC
  Filled 2021-02-12: qty 4500

## 2021-02-12 NOTE — Consult Note (Signed)
Cardiology Consultation:   Patient ID: Brandi Hoover; [id number]; Feb 13, 1940   Admit date: 02/09/2021 Date of Consult: 02/12/2021  Primary Care Provider: Harrel Lemon, MD Primary Cardiologist: New to [facility] - consult by [facility] Primary Electrophysiologist:  None   Patient Profile:   Brandi Hoover is a 81 y.o. female with a hx of recent admission for acute hypoxic respiratory failure secondary to viral bronchitis and bacterial PNA, CKD stage IIIb, anemia, DM, HTN, and HLD who is being seen today for the evaluation of Afib with RVR at the request of Dr. Damita Dunnings.  History of Present Illness:   Brandi Hoover has no previously known cardiac history. She was admitted recently in late 12/2020 through mid 01/2021 with acute hypoxic respiratory failure secondary to viral bronchitis and bacterial pneumonia and discharged on 2 L nasal cannula to rehab facility. Echo performed during that admission showed an EF of 55%, normal RV systolic function and ventricular cavity size, no significant valvular abnormalities and an estimated right atrial pressure of 3 mmHg.   She was recently seen in the ED on 02/06/2021 with reports of a heart rate into the 20s to 30s bpm while working with rehab without associated symptoms, as well as bruising along her left flank after being dropped. EKG showed sinus tachycardia with PACs.  High sensitivity troponin negative x 2. Renal function demonstrated AKI with a SCr of 2.21 with a baseline around 1.3-1.5. CXR showed bibasilar atelectasis vs scarring with improved aeration of the left lung base. She was not bradycardic while in the ED for almost 15 hours.   She presented to [facility] on the night of 1/4 with SOB and tachycardia into the 180s bpm at her facility along with soft BP in the 03J systolic . She was found to be positive for influenza A and noted to be in Afib/flutter with RVR. 12-lead EKG demonstrated atrial flutter with 2:1 AV block, 141 bpm, poor R wave progression along the  precordial leads, and nonspecific st/t changes. She was also noted to have a down trending HGB with a baseline around 10.5 with a current level of 8.0. High sensitivity troponin 36. CXR showed trace bilateral pleural effusions along with aortic atherosclerosis. In the ED, she was given a 500 mL normal saline fluid bolus, IV Cardizem 10 mg with associated hypotension leading Cardizem infusion to not be initiated and she was placed on IV amiodarone instead. She remains in Afib with RVR with ventricular rates in the 130s bpm on IV amiodarone. She is without chest pain.     Past Medical History:  Diagnosis Date   Asthma    Diabetes mellitus without complication (HCC)    High cholesterol    Hypertension     Past Surgical History:  Procedure Laterality Date   right hip replacement       Home Meds: Prior to Admission medications   Medication Sig Start Date End Date Taking? Authorizing Provider  amLODipine (NORVASC) 10 MG tablet Take 10 mg by mouth daily.   Yes [provider]  ascorbic acid (VITAMIN C) 1000 MG tablet Take 1,000 mg by mouth daily.   Yes [provider]  aspirin EC 81 MG tablet Take 81 mg by mouth daily.   Yes [provider]  benazepril (LOTENSIN) 20 MG tablet Take 20 mg by mouth 2 (two) times daily.    Yes [provider]  Fluticasone-Salmeterol (ADVAIR) 250-50 MCG/DOSE AEPB Inhale 1 puff into the lungs 2 (two) times daily.  Yes [provider]  glipiZIDE (GLUCOTROL) 10 MG tablet Take 10 mg by mouth 2 (two) times daily before a meal.   Yes [provider]  hydrochlorothiazide (HYDRODIURIL) 12.5 MG tablet Take 12.5 mg by mouth 2 (two) times daily. 11/20/20  Yes [provider]  metFORMIN (GLUCOPHAGE) 500 MG tablet Take 1,000-1,500 mg by mouth 2 (two) times daily with a meal. 1500 mg in the morning and 1000 mg in the evening   Yes [provider]  Multiple Vitamin (MULTIVITAMIN WITH MINERALS) TABS tablet Take  1 tablet by mouth daily.   Yes [provider]  simvastatin (ZOCOR) 20 MG tablet Take 20 mg by mouth at bedtime.   Yes [provider]  vitamin B-12 (CYANOCOBALAMIN) 1000 MCG tablet Take 1,000 mcg by mouth daily.   Yes [provider]  albuterol (VENTOLIN HFA) 108 (90 Base) MCG/ACT inhaler Inhale 2 puffs into the lungs every 6 (six) hours as needed for wheezing.  07/11/15   [provider]  Calcium Carbonate-Vitamin D 600-400 MG-UNIT tablet Take 1 tablet by mouth at bedtime. Patient not taking: Reported on 03/03/2021    [provider]  docusate sodium (COLACE) 100 MG capsule Take 1 capsule (100 mg total) by mouth 2 (two) times daily. Patient not taking: Reported on 03/05/2021 07/18/15   Gladstone Lighter, MD  guaiFENesin (MUCINEX) 600 MG 12 hr tablet Take 1 tablet (600 mg total) by mouth 2 (two) times daily. Patient not taking: Reported on 02/09/2021 07/18/15   Gladstone Lighter, MD  guaiFENesin-codeine 100-10 MG/5ML syrup Take 5 mLs by mouth at bedtime as needed for cough. Patient not taking: Reported on 02/13/2021 07/18/15   Gladstone Lighter, MD  ipratropium-albuterol (DUONEB) 0.5-2.5 (3) MG/3ML SOLN Take 3 mLs by nebulization every 6 (six) hours as needed (wheezing, shortness of breath). Patient not taking: Reported on 03/04/2021 07/18/15   Gladstone Lighter, MD  phenol-menthol (CEPASTAT) 14.5 MG lozenge Place 1 lozenge inside cheek as needed for sore throat. Patient not taking: Reported on 02/22/2021 07/18/15   Gladstone Lighter, MD  polyethylene glycol [facility] / Floria Raveling) packet Take 17 g by mouth daily. HOLD for > 2 bowel movements/day Patient not taking: Reported on 02/10/2021 07/18/15   Gladstone Lighter, MD  traMADol (ULTRAM) 50 MG tablet Take 1 tablet (50 mg total) by mouth every 6 (six) hours as needed. Patient not taking: Reported on 01/07/2021 07/05/19   Carrie Mew, MD    Inpatient Medications: Scheduled Meds:  insulin aspart  0-15 Units  Subcutaneous TID WC   oseltamivir  30 mg Oral Daily   Continuous Infusions:  amiodarone 40 mg/hr (02/12/21 0743)   PRN Meds: acetaminophen, ondansetron (ZOFRAN) IV  Allergies:   Allergies  Allergen Reactions   Mometasone Furo-Formoterol Fum Shortness Of Breath    Pt unable to breath   Azithromycin Itching   Gabapentin Other (See Comments)    Bad dreams, insomnia   Omeprazole Other (See Comments)    Reaction: unknown   Other Other (See Comments)    Made her feel worse   Ramipril Other (See Comments)    Reaction: unknown    Social History:   Social History   Socioeconomic History   Marital status: Widowed    Spouse name: Not on file   Number of children: Not on file   Years of education: Not on file   Highest education level: Not on file  Occupational History   Not on file  Tobacco Use   Smoking status: Never  Smokeless tobacco: Never  Substance and Sexual Activity   Alcohol use: No   Drug use: No   Sexual activity: Not on file  Other Topics Concern   Not on file  Social History Narrative   Not on file   Social Determinants of Health   Financial Resource Strain: Not on file  Food Insecurity: Not on file  Transportation Needs: Not on file  Physical Activity: Not on file  Stress: Not on file  Social Connections: Not on file  Intimate Partner Violence: Not on file     Family History:   Family History  Problem Relation Age of Onset   COPD Father    Stroke Mother     ROS:  Review of Systems  Constitutional:  Positive for malaise/fatigue. Negative for chills, diaphoresis, fever and weight loss.  HENT:  Negative for congestion.   Eyes:  Negative for discharge and redness.  Respiratory:  Positive for cough and shortness of breath. Negative for sputum production and wheezing.   Cardiovascular:  Negative for chest pain, palpitations, orthopnea, claudication, leg swelling and PND.  Gastrointestinal:  Negative for abdominal pain, blood in stool, heartburn,  melena, nausea and vomiting.  Musculoskeletal:  Negative for falls and myalgias.  Skin:  Negative for rash.  Neurological:  Positive for weakness. Negative for dizziness, tingling, tremors, sensory change, speech change, focal weakness and loss of consciousness.  Endo/Heme/Allergies:  Bruises/bleeds easily.  Psychiatric/Behavioral:  Negative for substance abuse. The patient is not nervous/anxious.   All other systems reviewed and are negative.    Physical Exam/Data:   Vitals:   02/12/21 0600 02/12/21 0615 02/12/21 0630 02/12/21 0647  BP: (!) 152/80 132/68 111/63 127/74  Pulse: (!) 131 (!) 120 (!) 128 (!) 128  Resp: (!) 26 (!) 27 (!) 29 (!) 27  Temp:      SpO2: 94% 94% 93% 93%  Weight:      Height:       No intake or output data in the 24 hours ending 02/12/21 0758 Filed Weights   02/21/2021 2037  Weight: 108.9 kg   Body mass index is 38.75 kg/m.   Physical Exam: General: Well developed, well nourished, in no acute distress. Head: Normocephalic, atraumatic, sclera non-icteric, no xanthomas, nares without discharge.  Neck: Negative for carotid bruits. JVD not elevated. Lungs: Diminished breath sounds bilaterally. Breathing is unlabored. Heart: Tachycardic, IRIR with S1 S2. No murmurs, rubs, or gallops appreciated. Abdomen: Soft, non-tender, non-distended with normoactive bowel sounds. No hepatomegaly. No rebound/guarding. No obvious abdominal masses. Msk:  Strength and tone appear normal for age. Extremities: No clubbing or cyanosis. No edema. Distal pedal pulses are 2+ and equal bilaterally. Neuro: Alert and oriented X 3. No facial asymmetry. No focal deficit. Moves all extremities spontaneously. Psych:  Responds to questions appropriately with a normal affect.   EKG:  The EKG was personally reviewed and demonstrates: atrial flutter with 2:1 AV block, 141 bpm, poor R wave progression along the precordial leads, and nonspecific st/t changes Telemetry:  Telemetry was personally  reviewed and demonstrates: Afib with RVR with ventricular rates in the 130s bpm  Weights: Filed Weights   03/02/2021 2037  Weight: 108.9 kg    Relevant CV Studies:  2D echo 01/07/2021: 1. Left ventricular ejection fraction, by estimation, is 55%. The left  ventricle has normal function. Left ventricular endocardial border not  optimally defined to evaluate regional wall motion. Left ventricular  diastolic parameters are indeterminate.   2. Right ventricular systolic function is normal.  The right ventricular  size is normal.   3. The mitral valve is normal in structure. No evidence of mitral valve  regurgitation.   4. The aortic valve was not well visualized. Aortic valve regurgitation  is not visualized.   5. The inferior vena cava is normal in size with greater than 50%  respiratory variability, suggesting right atrial pressure of 3 mmHg.   Laboratory Data:  Chemistry Recent Labs  Lab 02/05/21 1804 02/10/2021 2048  NA 138 138  K 4.0 4.1  CL 106 109  CO2 23 22  GLUCOSE 209* 144*  BUN 39* 30*  CREATININE 2.21* 2.06*  CALCIUM 8.1* 7.9*  GFRNONAA 22* 24*  ANIONGAP 9 7    No results for input(s): PROT, ALBUMIN, AST, ALT, ALKPHOS, BILITOT in the last 168 hours. Hematology Recent Labs  Lab 02/05/21 1804 02/27/2021 2048  WBC 6.2 7.5  RBC 2.74* 2.75*  HGB 8.1* 8.0*  HCT 25.5* 25.8*  MCV 93.1 93.8  MCH 29.6 29.1  MCHC 31.8 31.0  RDW 14.2 14.8  PLT 368 430*   Cardiac EnzymesNo results for input(s): TROPONINI in the last 168 hours. No results for input(s): TROPIPOC in the last 168 hours.  BNPNo results for input(s): BNP, PROBNP in the last 168 hours.  DDimer No results for input(s): DDIMER in the last 168 hours.  Radiology/Studies:  DG Chest 2 View  Result Date: 03/05/2021 IMPRESSION: 1.  Bilateral trace pleural effusion. 2.  Aortic Atherosclerosis (ICD10-I70.0). Electronically Signed   By: Iven Finn M.D.   On: 02/25/2021 21:10    Assessment and Plan:   1.  Atrial flutter with 2:1 AV block: -Likely in the setting of acute on chronic hypoxic respiratory failure secondary and influenza A -Started on amiodarone gtt overnight due to hypotension with IV diltiazem injection (not ideal given she has not been anticoagulated, though options are limited) -Increase IV amiodarone back to full dose  -Ventricular rates remain tachycardic into the 130s bpm -Trial of low dose diltiazem gtt (BP in the 903E systolic currently) -May need metoprolol as well  -Digoxin is not an option given AKI -If ventricular rates remain difficult to control, may need to pursue TEE-guided DCCV, otherwise, given we do not know exactly how long she has been in Afib, would pursue rate control with plans for DCCV as an outpatient once she has been adequately anticoagulated without interruption for 4 weeks and once her acute illness is improved  -TSH normal in the ED on 02/05/2021 -Potassium and magnesium at goal -CHADS2VASc at least 6 (HTN, age x 2, DM, vascular disease, sex category) -Start heparin gtt with monitoring of HGB -Recent echo demonstrated preserved LVSF  2. Acute on chronic hypoxic respiratory failure with Influenza A: -Per primary service -Family is focused on Mucinex   3. Elevated high sensitivity troponin: -Likely supply demand ischemia in the setting of atrial flutter with 2:1 AV block, acute on chronic hypoxic respiratory failure, influenza A, AKI on CKD, and worsening anemia -No plans for inpatient ischemic ischemic evaluation given acute flu illness, acute on CKD and worsneing anemia   4. Acute on CKD stage IIIb: -Gentle hydration -Avoid nephrotoxic agents/medications  5. Acute on possible anemia of chronic disease: -Baseline HGB appears to be around 10.5 with a current value of 8.0 -Possibly exacerbated by recent flank bruising in the setting of fall/drop -Work up per primary service       For questions or updates, please contact [place] Please  consult [email or web address] for contact info  under Cardiology/STEMI.   Signed, Christell Faith, PA-C [place] Pager: [phone] 02/12/2021, 7:58 AM

## 2021-02-12 NOTE — ED Notes (Signed)
During assessment, pt appeared to be tachycardiac with a HR of 130-140's. Pt is currently on amino drip on 30mg /hr per order. MD aware of tachycardia, awaiting new orders at this time.

## 2021-02-12 NOTE — ED Notes (Signed)
Pt placed on bedpan to have a BM. Pt had a semi soft BM. Pt missed bed pan.  Pt cleaned up, new chux, new linen placed as well as new brief and purewick placed as well.   Pt also placed in hospital bed for comfort.

## 2021-02-12 NOTE — ED Notes (Signed)
Pt stated "she was not hungry this morning". Breakfast tray @ bedside.

## 2021-02-12 NOTE — ED Notes (Signed)
Pts dentures placed in a pink denture cup.

## 2021-02-12 NOTE — Consult Note (Signed)
ANTICOAGULATION CONSULT NOTE - Initial Consult  Pharmacy Consult for Heparin Indication: atrial fibrillation  Allergies  Allergen Reactions   Mometasone Furo-Formoterol Fum Shortness Of Breath    Pt unable to breath   Azithromycin Itching   Gabapentin Other (See Comments)    Bad dreams, insomnia   Omeprazole Other (See Comments)    Reaction: unknown   Other Other (See Comments)    Made her feel worse   Ramipril Other (See Comments)    Reaction: unknown    Patient Measurements: Height: 5\' 6"  (167.6 cm) Weight: 108.9 kg (240 lb 1.3 oz) IBW/kg (Calculated) : 59.3 Heparin Dosing Weight: 84.6 kg  Vital Signs: Temp: 98.1 F (36.7 C) (01/05 0945) Temp Source: Oral (01/05 0945) BP: 129/59 (01/05 1147) Pulse Rate: 120 (01/05 1147)  Labs: Recent Labs    03/02/2021 2048  HGB 8.0*  HCT 25.8*  PLT 430*  CREATININE 2.06*  TROPONINIHS 36*    Estimated Creatinine Clearance: 26.3 mL/min (A) (by C-G formula based on SCr of 2.06 mg/dL (H)).   Medical History: Past Medical History:  Diagnosis Date   Asthma    Diabetes mellitus without complication (HCC)    High cholesterol    Hypertension     Medications:  No chronic PTA anticoagulant use  Assessment: Pharmacy has been consulted to initiate heparin infusion in 80yo patient with new onset Afib with RVR. Patient was started on amiodarone infusion. CHADS2VASc of at least 6. Troponin levels mildly elevated. Baseline labs have been ordered and are pending.  Goal of Therapy:  Heparin level 0.3-0.7 units/ml Monitor platelets by anticoagulation protocol: Yes   Plan:  Give 4000 units bolus x 1 Start heparin infusion at 1100 units/hr Check anti-Xa level in 8 hours and daily while on heparin Continue to monitor H&H and platelets  Pearla Dubonnet, PharmD Clinical Pharmacist 02/12/2021 3:35 PM

## 2021-02-12 NOTE — Progress Notes (Addendum)
°  INTERVAL PROGRESS NOTE    Brandi Hoover- 81 y.o. female  LOS: 1 __________________________________________________________________  SUBJECTIVE: Admitted 02/27/2021 with cc of SOB Chief Complaint  Patient presents with   Tachycardia   Since admission, continues to be significantly tachycardic.   OBJECTIVE: Blood pressure 127/74, pulse (!) 128, temperature 98.9 F (37.2 C), resp. rate (!) 27, height 5\' 6"  (1.676 m), weight 108.9 kg, SpO2 93 %.  General: NAD, pleasant, able to participate in exam Cardiac: tachycardic, difficult to distinguish at current rate but appreciate systolic murmur and irregular rhythm Respiratory: positive rhales bilaterally. Mild increased work of breathing. Able to speak in complete sentences and lay flat. Abdomen: distended, non-tender to palpation Extremities: significant 2+ pitting LE edema. WWP. Skin: warm and dry, no rashes noted Neuro: alert and oriented, no focal deficits Psych: Normal affect and mood  ASSESSMENT/PLAN:  I have reviewed the full H&P by Dr. Damita Dunnings, and I agree with the assessment and plan as outlined therein.  In addition: Afib RVR- dilt bolus had negative effect on BP so was changed to amio drip, per cardiology.  Patient continues to be consistently in 120-130s HR at 30mg /hr amio drip. Will increase to 40mg /hr and monitor closely.  Cardiology following, appreciate recs   Principal Problem:   Atrial fibrillation with RVR, new onset ([place]) Active Problems:   COPD (chronic obstructive pulmonary disease) ([place])   Anemia   Hypertension   Stage 3b chronic kidney disease ([place])   Diabetes mellitus, type II ([place])   Influenza A    Richarda Osmond, DO [facility] 02/12/2021, 7:33 AM    [email or web address] Available by Epic secure chat 7AM-7PM. If 7PM-7AM, please contact night-coverage   No Charge

## 2021-02-12 NOTE — H&P (Addendum)
History and Physical    Brandi Hoover YTK:[id number] DOB: [birth year] DOA: 03/03/2021  PCP: Harrel Lemon, MD   Patient coming from: SNF  I have personally briefly reviewed patient's relevant medical records in [facility]  Chief Complaint: shortness of breath, rapid pulse  HPI: Brandi Hoover is a 81 y.o. female with medical history significant for HTN, CKD stage IIIb, DM, COPD, chronic anemia, recently hospitalized from 11/29-12/16 with acute respiratory failure secondary to metapneumovirus pneumonia and superimposed bacterial pneumonia, discharged on oxygen but weaned down from 4L  to 1L O2 currently, who presents to the ED from rehab with a complaint of shortness of breath with finding of heart rate of 186 and BP 96/58 at the facility.  By arrival to the emergency room patient denied feeling unwell, but daughter at the bedside finds her to be more short of breath than her baseline.  Patient has a congested cough.  She denied chest pains,  denied headache or blurred vision, denied nausea, vomiting, abdominal pain or diarrhea.  ED course: On arrival, patient in rapid A. fib at a rate of 145 with BP 119/72 and tachypneic to the 20s to 30s with O2 sat 94% on 2 L. Blood work: Hemoglobin 8, unchanged from 12/29 though down from 9.6 on 12/16.  Creatinine 2.06, around the same as baseline of 1.83 Influenza A positive  EKG, personally reviewed and interpreted: A. fib at 141 with no acute ST-T wave changes  Imaging: Chest x-ray with bilateral trace pleural effusion  Patient was administered IV diltiazem bolus with a fall and blood pressure to 95/53. The ED provider subsequently spoke with cardiologist, Dr. Aundra Dubin who agreed with amiodarone bolus and infusion Hospitalist consulted for admission.   Review of Systems: As per HPI otherwise all other systems on review of systems negative.   Assessment/Plan    Atrial fibrillation with RVR, new onset ([place]) - New onset likely triggered by  influenza A - Continue amiodarone infusion for rate control - Hold off on anticoagulation pending cardiology consult - Cardiology consult    Influenza A   Acute  respiratory failure - Patient with increased work of breathing, tachypneic, requiring 2 L above previous 1 L - Tamiflu - Symptomatic treatment - Supplemental O2 to keep sats over 92    Anemia - Hemoglobin 8, down from baseline of 9.6 on 12/16 - Anemia panel - Continue to monitor and transfuse if necessary    COPD (chronic obstructive pulmonary disease) ([place]) - Not acutely exacerbated - DuoNebs as needed    Hypertension - Continue home meds    Stage 3b chronic kidney disease ([place]) - Renal function at baseline    Diabetes mellitus, type II ([place]) - Sliding scale insulin coverage denies   DVT prophylaxis: heparin infusion Code Status: full code  Family Communication: Daughter at bedside Disposition Plan: Back to previous home environment Consults called: Cardiology Status:At the time of admission, it appears that the appropriate admission status for this patient is INPATIENT. This is judged to be reasonable and necessary in order to provide the required intensity of service to ensure the patient's safety given the presenting symptoms, physical exam findings, and initial radiographic and laboratory data in the context of their  Comorbid conditions.   Patient requires inpatient status due to high intensity of service, high risk for further deterioration and high frequency of surveillance required.   I certify that at the point of admission it is my clinical judgment that the patient will require inpatient hospital  care spanning beyond 2 midnights     Physical Exam: Vitals:   02/24/2021 2345 02/12/21 0000 02/12/21 0015 02/12/21 0030  BP: (!) 90/54 (!) 107/94 (!) 100/59 108/78  Pulse: (!) 138 (!) 141 (!) 141 (!) 144  Resp: (!) 22 (!) 32 (!) 21 (!) 26  Temp:      SpO2: 97% 96% 96% 92%  Weight:      Height:        Constitutional: Alert, oriented x 3 .  Mild respiratory distress with conversational dyspnea HEENT:      Head: Normocephalic and atraumatic.         Eyes: PERLA, EOMI, Conjunctivae are normal. Sclera is non-icteric.       Mouth/Throat: Mucous membranes are moist.       Neck: Supple with no signs of meningismus. Cardiovascular: Irregularly irregular and tachycardic. No murmurs, gallops, or rubs. 2+ symmetrical distal pulses are present . No JVD. No  LE edema Respiratory: Tachypneic.Lungs sounds clear bilaterally. No wheezes, crackles, or rhonchi.  Gastrointestinal: Soft, non tender, non distended. Positive bowel sounds.  Genitourinary: No CVA tenderness. Musculoskeletal: Nontender with normal range of motion in all extremities. No cyanosis, or erythema of extremities. Neurologic:  Face is symmetric. Moving all extremities. No gross focal neurologic deficits . Skin: Skin is warm, dry.  No rash or ulcers Psychiatric: Mood and affect are appropriate     Past Medical History:  Diagnosis Date   Asthma    Diabetes mellitus without complication ([place])    High cholesterol    Hypertension     Past Surgical History:  Procedure Laterality Date   right hip replacement       reports that she has never smoked. She has never used smokeless tobacco. She reports that she does not drink alcohol and does not use drugs.  Allergies  Allergen Reactions   Mometasone Furo-Formoterol Fum Shortness Of Breath    Pt unable to breath   Azithromycin Itching   Gabapentin Other (See Comments)    Bad dreams, insomnia   Omeprazole Other (See Comments)    Reaction: unknown   Other Other (See Comments)    Made her feel worse   Ramipril Other (See Comments)    Reaction: unknown    Family History  Problem Relation Age of Onset   COPD Father    Stroke Mother       Prior to Admission medications   Medication Sig Start Date End Date Taking? Authorizing Provider  amLODipine (NORVASC) 10 MG tablet Take  10 mg by mouth daily.   Yes [provider]  ascorbic acid (VITAMIN C) 1000 MG tablet Take 1,000 mg by mouth daily.   Yes [provider]  aspirin EC 81 MG tablet Take 81 mg by mouth daily.   Yes [provider]  benazepril (LOTENSIN) 20 MG tablet Take 20 mg by mouth 2 (two) times daily.    Yes [provider]  Fluticasone-Salmeterol (ADVAIR) 250-50 MCG/DOSE AEPB Inhale 1 puff into the lungs 2 (two) times daily.   Yes [provider]  glipiZIDE (GLUCOTROL) 10 MG tablet Take 10 mg by mouth 2 (two) times daily before a meal.   Yes [provider]  hydrochlorothiazide (HYDRODIURIL) 12.5 MG tablet Take 12.5 mg by mouth 2 (two) times daily. 11/20/20  Yes [provider]  metFORMIN (GLUCOPHAGE) 500 MG tablet Take 1,000-1,500 mg by mouth 2 (two) times daily with a meal. 1500 mg in the morning and 1000 mg in the evening  Yes [provider]  Multiple Vitamin (MULTIVITAMIN WITH MINERALS) TABS tablet Take 1 tablet by mouth daily.   Yes [provider]  simvastatin (ZOCOR) 20 MG tablet Take 20 mg by mouth at bedtime.   Yes [provider]  vitamin B-12 (CYANOCOBALAMIN) 1000 MCG tablet Take 1,000 mcg by mouth daily.   Yes [provider]  albuterol (VENTOLIN HFA) 108 (90 Base) MCG/ACT inhaler Inhale 2 puffs into the lungs every 6 (six) hours as needed for wheezing.  07/11/15   [provider]  Calcium Carbonate-Vitamin D 600-400 MG-UNIT tablet Take 1 tablet by mouth at bedtime. Patient not taking: Reported on 03/04/2021    [provider]  docusate sodium (COLACE) 100 MG capsule Take 1 capsule (100 mg total) by mouth 2 (two) times daily. Patient not taking: Reported on 02/15/2021 07/18/15   Gladstone Lighter, MD  guaiFENesin (MUCINEX) 600 MG 12 hr tablet Take 1 tablet (600 mg total) by mouth 2 (two) times daily. Patient not taking: Reported on 02/26/2021 07/18/15   Gladstone Lighter, MD   guaiFENesin-codeine 100-10 MG/5ML syrup Take 5 mLs by mouth at bedtime as needed for cough. Patient not taking: Reported on 02/17/2021 07/18/15   Gladstone Lighter, MD  ipratropium-albuterol (DUONEB) 0.5-2.5 (3) MG/3ML SOLN Take 3 mLs by nebulization every 6 (six) hours as needed (wheezing, shortness of breath). Patient not taking: Reported on 03/08/2021 07/18/15   Gladstone Lighter, MD  phenol-menthol (CEPASTAT) 14.5 MG lozenge Place 1 lozenge inside cheek as needed for sore throat. Patient not taking: Reported on 02/10/2021 07/18/15   Gladstone Lighter, MD  polyethylene glycol [facility] Floria Raveling) packet Take 17 g by mouth daily. HOLD for > 2 bowel movements/day Patient not taking: Reported on 03/08/2021 07/18/15   Gladstone Lighter, MD  traMADol (ULTRAM) 50 MG tablet Take 1 tablet (50 mg total) by mouth every 6 (six) hours as needed. Patient not taking: Reported on 01/07/2021 07/05/19   Carrie Mew, MD      Labs on Admission: I have personally reviewed following labs and imaging studies  CBC: Recent Labs  Lab 02/05/21 1804 02/23/2021 2048  WBC 6.2 7.5  HGB 8.1* 8.0*  HCT 25.5* 25.8*  MCV 93.1 93.8  PLT 368 161*   Basic Metabolic Panel: Recent Labs  Lab 02/05/21 1759 02/05/21 1804 03/05/2021 2048  NA  --  138 138  K  --  4.0 4.1  CL  --  106 109  CO2  --  23 22  GLUCOSE  --  209* 144*  BUN  --  39* 30*  CREATININE  --  2.21* 2.06*  CALCIUM  --  8.1* 7.9*  MG 2.1  --   --    GFR: Estimated Creatinine Clearance: 26.3 mL/min (A) (by C-G formula based on SCr of 2.06 mg/dL (H)). Liver Function Tests: No results for input(s): AST, ALT, ALKPHOS, BILITOT, PROT, ALBUMIN in the last 168 hours. No results for input(s): LIPASE, AMYLASE in the last 168 hours. No results for input(s): AMMONIA in the last 168 hours. Coagulation Profile: No results for input(s): INR, PROTIME in the last 168 hours. Cardiac Enzymes: No results for input(s): CKTOTAL, CKMB, CKMBINDEX, TROPONINI in the last  168 hours. BNP (last 3 results) No results for input(s): PROBNP in the last 8760 hours. HbA1C: No results for input(s): HGBA1C in the last 72 hours. CBG: No results for input(s): GLUCAP in the last 168 hours. Lipid Profile: No results for input(s): CHOL, HDL, LDLCALC, TRIG, CHOLHDL, LDLDIRECT in the last  72 hours. Thyroid Function Tests: No results for input(s): TSH, T4TOTAL, FREET4, T3FREE, THYROIDAB in the last 72 hours. Anemia Panel: No results for input(s): VITAMINB12, FOLATE, FERRITIN, TIBC, IRON, RETICCTPCT in the last 72 hours. Urine analysis:    Component Value Date/Time   COLORURINE YELLOW (A) 07/04/2019 0131   APPEARANCEUR HAZY (A) 07/04/2019 0131   LABSPEC 1.011 07/04/2019 0131   PHURINE 5.0 07/04/2019 0131   GLUCOSEU 50 (A) 07/04/2019 0131   HGBUR NEGATIVE 07/04/2019 0131   BILIRUBINUR NEGATIVE 07/04/2019 0131   KETONESUR NEGATIVE 07/04/2019 0131   PROTEINUR 100 (A) 07/04/2019 0131   NITRITE NEGATIVE 07/04/2019 0131   LEUKOCYTESUR TRACE (A) 07/04/2019 0131    Radiological Exams on Admission: DG Chest 2 View  Result Date: 02/20/2021 CLINICAL DATA:  tachycardia EXAM: CHEST - 2 VIEW COMPARISON:  CXR 02/05/21 FINDINGS: The heart and mediastinal contours are unchanged. Aortic calcification. Biapical pleural/pulmonary scarring. Bibasilar streaky airspace opacity. No focal consolidation. No pulmonary edema. Bilateral trace pleural effusion. No pneumothorax. No acute osseous abnormality. Bilateral shoulder severe degenerative changes. IMPRESSION: 1.  Bilateral trace pleural effusion. 2.  Aortic Atherosclerosis (ICD10-I70.0). Electronically Signed   By: Iven Finn M.D.   On: 03/01/2021 21:10       Athena Masse MD [facility]   02/12/2021, 12:48 AM

## 2021-02-13 ENCOUNTER — Inpatient Hospital Stay: Payer: Medicare Other

## 2021-02-13 ENCOUNTER — Other Ambulatory Visit: Payer: Medicare Other

## 2021-02-13 DIAGNOSIS — J101 Influenza due to other identified influenza virus with other respiratory manifestations: Secondary | ICD-10-CM | POA: Diagnosis not present

## 2021-02-13 DIAGNOSIS — Z01818 Encounter for other preprocedural examination: Secondary | ICD-10-CM | POA: Diagnosis not present

## 2021-02-13 DIAGNOSIS — I4891 Unspecified atrial fibrillation: Secondary | ICD-10-CM

## 2021-02-13 DIAGNOSIS — J9601 Acute respiratory failure with hypoxia: Secondary | ICD-10-CM | POA: Diagnosis not present

## 2021-02-13 DIAGNOSIS — J9621 Acute and chronic respiratory failure with hypoxia: Secondary | ICD-10-CM

## 2021-02-13 DIAGNOSIS — L8990 Pressure ulcer of unspecified site, unspecified stage: Secondary | ICD-10-CM | POA: Insufficient documentation

## 2021-02-13 LAB — BLOOD GAS, ARTERIAL
Acid-base deficit: 10.9 mmol/L — ABNORMAL HIGH (ref 0.0–2.0)
Acid-base deficit: 13 mmol/L — ABNORMAL HIGH (ref 0.0–2.0)
Bicarbonate: 19.6 mmol/L — ABNORMAL LOW (ref 20.0–28.0)
Bicarbonate: 17.7 mmol/L — ABNORMAL LOW (ref 20.0–28.0)
FIO2: 0.5
FIO2: 1
MECHVT: 450 mL
MECHVT: 500 mL
O2 Saturation: 99.1 %
O2 Saturation: 95.9 %
PEEP: 5 cmH2O
PEEP: 10 cmH2O
Patient temperature: 37
Patient temperature: 37
RATE: 20 resp/min
RATE: 22 resp/min
pCO2 arterial: 71 mmHg (ref 32.0–48.0)
pCO2 arterial: 67 mmHg (ref 32.0–48.0)
pH, Arterial: 7.05 — CL (ref 7.350–7.450)
pH, Arterial: 7.03 — CL (ref 7.350–7.450)
pO2, Arterial: 181 mmHg — ABNORMAL HIGH (ref 83.0–108.0)
pO2, Arterial: 115 mmHg — ABNORMAL HIGH (ref 83.0–108.0)

## 2021-02-13 LAB — CBC
HCT: 28.7 % — ABNORMAL LOW (ref 36.0–46.0)
Hemoglobin: 8.6 g/dL — ABNORMAL LOW (ref 12.0–15.0)
MCH: 28.1 pg (ref 26.0–34.0)
MCHC: 30 g/dL (ref 30.0–36.0)
MCV: 93.8 fL (ref 80.0–100.0)
Platelets: 500 10*3/uL — ABNORMAL HIGH (ref 150–400)
RBC: 3.06 MIL/uL — ABNORMAL LOW (ref 3.87–5.11)
RDW: 15.1 % (ref 11.5–15.5)
WBC: 15.2 10*3/uL — ABNORMAL HIGH (ref 4.0–10.5)
nRBC: 0.6 % — ABNORMAL HIGH (ref 0.0–0.2)

## 2021-02-13 LAB — URINALYSIS, COMPLETE (UACMP) WITH MICROSCOPIC
Bacteria, UA: NONE SEEN
Bilirubin Urine: NEGATIVE
Glucose, UA: NEGATIVE mg/dL
Hgb urine dipstick: NEGATIVE
Ketones, ur: NEGATIVE mg/dL
Leukocytes,Ua: NEGATIVE
Nitrite: NEGATIVE
Protein, ur: 100 mg/dL — AB
Specific Gravity, Urine: 1.015 (ref 1.005–1.030)
pH: 5 (ref 5.0–8.0)

## 2021-02-13 LAB — HEPARIN LEVEL (UNFRACTIONATED)
Heparin Unfractionated: 0.28 IU/mL — ABNORMAL LOW (ref 0.30–0.70)
Heparin Unfractionated: <0.1 IU/mL — ABNORMAL LOW (ref 0.30–0.70)
Heparin Unfractionated: 0.8 IU/mL — ABNORMAL HIGH (ref 0.30–0.70)

## 2021-02-13 LAB — GLUCOSE, CAPILLARY
Glucose-Capillary: 110 mg/dL — ABNORMAL HIGH (ref 70–99)
Glucose-Capillary: 156 mg/dL — ABNORMAL HIGH (ref 70–99)
Glucose-Capillary: 176 mg/dL — ABNORMAL HIGH (ref 70–99)
Glucose-Capillary: 194 mg/dL — ABNORMAL HIGH (ref 70–99)
Glucose-Capillary: 213 mg/dL — ABNORMAL HIGH (ref 70–99)

## 2021-02-13 LAB — CBG MONITORING, ED: Glucose-Capillary: 201 mg/dL — ABNORMAL HIGH (ref 70–99)

## 2021-02-13 LAB — BASIC METABOLIC PANEL
Anion gap: 8 (ref 5–15)
BUN: 39 mg/dL — ABNORMAL HIGH (ref 8–23)
CO2: 22 mmol/L (ref 22–32)
Calcium: 7.6 mg/dL — ABNORMAL LOW (ref 8.9–10.3)
Chloride: 106 mmol/L (ref 98–111)
Creatinine, Ser: 3.05 mg/dL — ABNORMAL HIGH (ref 0.44–1.00)
GFR, Estimated: 15 mL/min — ABNORMAL LOW (ref >60)
Glucose, Bld: 191 mg/dL — ABNORMAL HIGH (ref 70–99)
Potassium: 4.4 mmol/L (ref 3.5–5.1)
Sodium: 136 mmol/L (ref 135–145)

## 2021-02-13 LAB — MAGNESIUM: Magnesium: 1.7 mg/dL (ref 1.7–2.4)

## 2021-02-13 LAB — THYROID PANEL WITH TSH
Free Thyroxine Index: 1.9 (ref 1.2–4.9)
T3 Uptake Ratio: 32 % (ref 24–39)
T4, Total: 6 ug/dL (ref 4.5–12.0)
TSH: 1.93 u[IU]/mL (ref 0.450–4.500)

## 2021-02-13 LAB — MRSA NEXT GEN BY PCR, NASAL: MRSA by PCR Next Gen: NOT DETECTED

## 2021-02-13 LAB — LACTIC ACID, PLASMA
Lactic Acid, Venous: 2.1 mmol/L (ref 0.5–1.9)
Lactic Acid, Venous: 5.6 mmol/L (ref 0.5–1.9)

## 2021-02-13 LAB — STREP PNEUMONIAE URINARY ANTIGEN: Strep Pneumo Urinary Antigen: NEGATIVE

## 2021-02-13 LAB — TROPONIN I (HIGH SENSITIVITY)
Troponin I (High Sensitivity): 53 ng/L — ABNORMAL HIGH (ref <18)
Troponin I (High Sensitivity): 64 ng/L — ABNORMAL HIGH (ref <18)

## 2021-02-13 LAB — PROCALCITONIN: Procalcitonin: 19.6 ng/mL

## 2021-02-13 LAB — PHOSPHORUS: Phosphorus: 9.4 mg/dL — ABNORMAL HIGH (ref 2.5–4.6)

## 2021-02-13 MED ORDER — SODIUM CHLORIDE 0.9 % IV SOLN
250.0000 mL | INTRAVENOUS | Status: DC
  Administered 2021-02-13: 250 mL via INTRAVENOUS

## 2021-02-13 MED ORDER — ETOMIDATE 2 MG/ML IV SOLN
INTRAVENOUS | Status: AC | PRN
  Administered 2021-02-13: 20 mg via INTRAVENOUS

## 2021-02-13 MED ORDER — FENTANYL 2500MCG IN NS 250ML (10MCG/ML) PREMIX INFUSION: 25.0000 ug/h | INTRAVENOUS | Status: DC

## 2021-02-13 MED ORDER — PIPERACILLIN-TAZOBACTAM 3.375 G IVPB
3.3750 g | Freq: Three times a day (TID) | INTRAVENOUS | Status: DC
  Administered 2021-02-13: 3.375 g via INTRAVENOUS
  Filled 2021-02-13: qty 50

## 2021-02-13 MED ORDER — METOPROLOL TARTRATE 5 MG/5ML IV SOLN: 5.0000 mg | Freq: Three times a day (TID) | INTRAVENOUS | Status: DC | PRN

## 2021-02-13 MED ORDER — ETOMIDATE 2 MG/ML IV SOLN
INTRAVENOUS | Status: AC
  Filled 2021-02-13: qty 10

## 2021-02-13 MED ORDER — NOREPINEPHRINE 4 MG/250ML-% IV SOLN
0.0000 ug/min | INTRAVENOUS | Status: DC
  Filled 2021-02-13: qty 250

## 2021-02-13 MED ORDER — MIDAZOLAM-SODIUM CHLORIDE 100-0.9 MG/100ML-% IV SOLN
0.0000 mg/h | INTRAVENOUS | Status: DC
  Administered 2021-02-13: 2 mg/h via INTRAVENOUS
  Filled 2021-02-13: qty 100

## 2021-02-13 MED ORDER — PHENYLEPHRINE CONCENTRATED 100MG/250ML (0.4 MG/ML) INFUSION SIMPLE
0.0000 ug/min | INTRAVENOUS | Status: DC
  Administered 2021-02-13: 20 ug/min via INTRAVENOUS
  Filled 2021-02-13: qty 250

## 2021-02-13 MED ORDER — EPINEPHRINE HCL 5 MG/250ML IV SOLN IN NS
0.5000 ug/min | INTRAVENOUS | Status: DC
  Administered 2021-02-13: 0.5 ug/min via INTRAVENOUS
  Filled 2021-02-13: qty 250

## 2021-02-13 MED ORDER — ORAL CARE MOUTH RINSE
15.0000 mL | OROMUCOSAL | Status: DC
  Administered 2021-02-13 (×6): 15 mL via OROMUCOSAL

## 2021-02-13 MED ORDER — MAGNESIUM SULFATE 2 GM/50ML IV SOLN
2.0000 g | Freq: Once | INTRAVENOUS | Status: AC
  Administered 2021-02-13: 2 g via INTRAVENOUS
  Filled 2021-02-13: qty 50

## 2021-02-13 MED ORDER — FENTANYL BOLUS VIA INFUSION
25.0000 ug | INTRAVENOUS | Status: DC | PRN
  Administered 2021-02-13: 75 ug via INTRAVENOUS
  Administered 2021-02-13: 100 ug via INTRAVENOUS
  Filled 2021-02-13: qty 100

## 2021-02-13 MED ORDER — VASOPRESSIN 20 UNITS/100 ML INFUSION FOR SHOCK
0.0000 [IU]/min | INTRAVENOUS | Status: DC
  Administered 2021-02-13: 0.04 [IU]/min via INTRAVENOUS
  Administered 2021-02-13: 0.03 [IU]/min via INTRAVENOUS
  Filled 2021-02-13 (×3): qty 100

## 2021-02-13 MED ORDER — SODIUM BICARBONATE 8.4 % IV SOLN
50.0000 meq | Freq: Once | INTRAVENOUS | Status: AC
  Administered 2021-02-13: 50 meq via INTRAVENOUS
  Filled 2021-02-13: qty 50

## 2021-02-13 MED ORDER — CHLORHEXIDINE GLUCONATE 0.12% ORAL RINSE (MEDLINE KIT)
15.0000 mL | Freq: Two times a day (BID) | OROMUCOSAL | Status: DC
  Administered 2021-02-13 (×2): 15 mL via OROMUCOSAL

## 2021-02-13 MED ORDER — ROCURONIUM BROMIDE 10 MG/ML (PF) SYRINGE
PREFILLED_SYRINGE | INTRAVENOUS | Status: AC
  Filled 2021-02-13: qty 10

## 2021-02-13 MED ORDER — PIPERACILLIN-TAZOBACTAM IN DEX 2-0.25 GM/50ML IV SOLN
2.2500 g | Freq: Three times a day (TID) | INTRAVENOUS | Status: DC
  Administered 2021-02-13 (×2): 2.25 g via INTRAVENOUS
  Filled 2021-02-13 (×4): qty 50

## 2021-02-13 MED ORDER — INSULIN ASPART 100 UNIT/ML IJ SOLN: 0.0000 [IU] | INTRAMUSCULAR | Status: DC

## 2021-02-13 MED ORDER — FAMOTIDINE IN NACL 20-0.9 MG/50ML-% IV SOLN
20.0000 mg | INTRAVENOUS | Status: DC
  Administered 2021-02-13: 20 mg via INTRAVENOUS
  Filled 2021-02-13: qty 50

## 2021-02-13 MED ORDER — PIPERACILLIN-TAZOBACTAM IN DEX 2-0.25 GM/50ML IV SOLN
2.2500 g | Freq: Three times a day (TID) | INTRAVENOUS | Status: DC
  Filled 2021-02-13 (×2): qty 50

## 2021-02-13 MED ORDER — ROCURONIUM BROMIDE 50 MG/5ML IV SOLN
INTRAVENOUS | Status: AC | PRN
  Administered 2021-02-13: 50 mg via INTRAVENOUS

## 2021-02-13 MED ORDER — POLYETHYLENE GLYCOL 3350 17 G PO PACK: 17.0000 g | PACK | Freq: Every day | ORAL | Status: DC

## 2021-02-13 MED ORDER — CHLORHEXIDINE GLUCONATE CLOTH 2 % EX PADS
6.0000 | MEDICATED_PAD | Freq: Every day | CUTANEOUS | Status: DC
  Administered 2021-02-13: 6 via TOPICAL

## 2021-02-13 MED ORDER — NOREPINEPHRINE 16 MG/250ML-% IV SOLN
0.0000 ug/min | INTRAVENOUS | Status: DC
  Administered 2021-02-13: 40 ug/min via INTRAVENOUS
  Filled 2021-02-13: qty 250

## 2021-02-13 MED ORDER — NOREPINEPHRINE 4 MG/250ML-% IV SOLN
0.0000 ug/min | INTRAVENOUS | Status: DC
  Administered 2021-02-13: 4 ug/min via INTRAVENOUS
  Administered 2021-02-13: 2 ug/min via INTRAVENOUS
  Administered 2021-02-13: 10 ug/min via INTRAVENOUS
  Filled 2021-02-13: qty 250

## 2021-02-13 MED ORDER — ACETAMINOPHEN 325 MG PO TABS: 650.0000 mg | ORAL_TABLET | ORAL | Status: DC | PRN

## 2021-02-13 MED ORDER — FENTANYL CITRATE PF 50 MCG/ML IJ SOSY
PREFILLED_SYRINGE | INTRAMUSCULAR | Status: AC
  Filled 2021-02-13: qty 1

## 2021-02-13 MED ORDER — INSULIN ASPART 100 UNIT/ML IJ SOLN
3.0000 [IU] | Freq: Once | INTRAMUSCULAR | Status: AC
  Administered 2021-02-13: 3 [IU] via SUBCUTANEOUS
  Filled 2021-02-13: qty 1

## 2021-02-13 MED ORDER — STERILE WATER FOR INJECTION IV SOLN
INTRAVENOUS | Status: DC
  Filled 2021-02-13: qty 150
  Filled 2021-02-13: qty 1000

## 2021-02-13 MED ORDER — FENTANYL 2500MCG IN NS 250ML (10MCG/ML) PREMIX INFUSION
INTRAVENOUS | Status: AC
  Administered 2021-02-13: 25 ug/h via INTRAVENOUS
  Filled 2021-02-13: qty 250

## 2021-02-13 MED ORDER — LACTATED RINGERS IV BOLUS
1000.0000 mL | Freq: Once | INTRAVENOUS | Status: AC
  Administered 2021-02-13: 1000 mL via INTRAVENOUS

## 2021-02-13 MED ORDER — HEPARIN BOLUS VIA INFUSION
1250.0000 [IU] | Freq: Once | INTRAVENOUS | Status: AC
  Administered 2021-02-13: 1250 [IU] via INTRAVENOUS
  Filled 2021-02-13: qty 1250

## 2021-02-13 MED ORDER — FENTANYL CITRATE PF 50 MCG/ML IJ SOSY
PREFILLED_SYRINGE | INTRAMUSCULAR | Status: AC
  Filled 2021-02-13: qty 2

## 2021-02-13 MED ORDER — HYDROCORTISONE SOD SUC (PF) 100 MG IJ SOLR
100.0000 mg | Freq: Three times a day (TID) | INTRAMUSCULAR | Status: DC
  Administered 2021-02-13: 100 mg via INTRAVENOUS
  Filled 2021-02-13: qty 2

## 2021-02-13 MED ORDER — NOREPINEPHRINE 4 MG/250ML-% IV SOLN
INTRAVENOUS | Status: AC
  Administered 2021-02-13: 3 ug/min via INTRAVENOUS
  Filled 2021-02-13: qty 250

## 2021-02-13 MED ORDER — MIDAZOLAM BOLUS VIA INFUSION
0.0000 mg | INTRAVENOUS | Status: DC | PRN
  Filled 2021-02-13: qty 5

## 2021-02-13 MED ORDER — FENTANYL CITRATE (PF) 100 MCG/2ML IJ SOLN
INTRAMUSCULAR | Status: AC | PRN
  Administered 2021-02-13: 100 ug via INTRAVENOUS

## 2021-02-13 MED ORDER — DOCUSATE SODIUM 50 MG/5ML PO LIQD
100.0000 mg | Freq: Two times a day (BID) | ORAL | Status: DC
  Administered 2021-02-13: 100 mg
  Filled 2021-02-13 (×2): qty 10

## 2021-02-13 NOTE — ED Notes (Signed)
Elana RN aware of assigned bed

## 2021-02-13 NOTE — ED Notes (Signed)
Dr. Ouida Sills at beside for pt's sat of 79%, made decision to intubate pt. Agricultural consultant notified.

## 2021-02-13 NOTE — Consult Note (Signed)
ANTICOAGULATION CONSULT NOTE - Initial Consult  Pharmacy Consult for Heparin Indication: atrial fibrillation  Allergies  Allergen Reactions   Mometasone Furo-Formoterol Fum Shortness Of Breath    Pt unable to breath   Azithromycin Itching   Gabapentin Other (See Comments)    Bad dreams, insomnia   Omeprazole Other (See Comments)    Reaction: unknown   Other Other (See Comments)    Made her feel worse   Ramipril Other (See Comments)    Reaction: unknown    Patient Measurements: Height: 5\' 6"  (167.6 cm) Weight: 112.2 kg (247 lb 5.7 oz) IBW/kg (Calculated) : 59.3 Heparin Dosing Weight: 84.6 kg  Vital Signs: Temp: 100.4 F (38 C) (01/06 2000) Temp Source: Esophageal (01/06 1830) BP: 247/194 (01/06 1900) Pulse Rate: 98 (01/06 2000)  Labs: Recent Labs    02/28/2021 2048 02/12/21 2133 02/13/21 0045 02/13/21 1245 02/13/21 1613 02/13/21 2151  HGB 8.0*  --   --  8.6*  --   --   HCT 25.8*  --   --  28.7*  --   --   PLT 430*  --   --  500*  --   --   APTT  --  65*  --   --   --   --   LABPROT  --  14.7  --   --   --   --   INR  --  1.2  --   --   --   --   HEPARINUNFRC  --   --  0.28* <0.10*  --  0.80*  CREATININE 2.06*  --   --  3.05*  --   --   TROPONINIHS 36*  --   --   --  53* 64*     Estimated Creatinine Clearance: 18.1 mL/min (A) (by C-G formula based on SCr of 3.05 mg/dL (H)).   Medical History: Past Medical History:  Diagnosis Date   Asthma    Diabetes mellitus without complication (HCC)    High cholesterol    Hypertension     Medications:  No chronic PTA anticoagulant use  Assessment: Pharmacy has been consulted to initiate heparin infusion in 80yo patient with new onset Afib with RVR. Patient was started on amiodarone infusion. CHADS2VASc of at least 6. Troponin levels mildly elevated. Baseline labs have been ordered and are pending.  Date Time aPTT/HL Rate/Comment 1/6 0045 0.28  SUBthera; 1100 >1250 un/hr **1/6  0900  --  Heparin held per Dr.  Lanney Gins for intubation & CVC  1/6 1300 --  Heparin 1250 un/hr resumed     1/6 1245 <0.1  Level was drawn after gtt off >4hrs. 1/6 2151 0.8  SUPRAtherapeutic    Baseline Labs: aPTT - drawn after dose INR - 1.2 Hgb - 8>8.6 Plts - 430>500k  Goal of Therapy:  Heparin level 0.3-0.7 units/ml Monitor platelets by anticoagulation protocol: Yes   Plan:  Heparin supratherapeutic Decrease heparin infusion to 1150 units/hr Check anti-Xa level in 8 hours following rate change Continue to monitor H&H and platelets  Dorothe Pea, PharmD, BCPS Clinical Pharmacist   02/13/2021 10:31 PM

## 2021-02-13 NOTE — Procedures (Signed)
Arterial Catheter Insertion Procedure Note  Brandi Hoover  [id number]  [birth year]  Date:02/13/21  Time:4:04 PM    Provider Performing: Bradly Bienenstock    Procedure: Insertion of Arterial Line [phone]) with US guidance ([id number])   Indication(s) Blood pressure monitoring and/or need for frequent ABGs  Consent Risks of the procedure as well as the alternatives and risks of each were explained to the patient and/or caregiver.  Consent for the procedure was obtained and is signed in the bedside chart  Anesthesia None   Time Out Verified patient identification, verified procedure, site/side was marked, verified correct patient position, special equipment/implants available, medications/allergies/relevant history reviewed, required imaging and test results available.   Sterile Technique Maximal sterile technique including full sterile barrier drape, hand hygiene, sterile gown, sterile gloves, mask, hair covering, sterile ultrasound probe cover (if used).   Procedure Description Area of catheter insertion was cleaned with chlorhexidine and draped in sterile fashion. With real-time ultrasound guidance an arterial catheter was placed into the right femoral artery.  Appropriate arterial tracings confirmed on monitor.     Complications/Tolerance None; patient tolerated the procedure well.   EBL Minimal   Specimen(s) None   BIOPATCH applied to the insertion site.   [name], AGACNP-BC [facility] Prefer epic messenger for cross cover needs If after hours, please call E-link

## 2021-02-13 NOTE — Procedures (Signed)
Intubation Procedure Note  [name]  [id number]  [birth year]  Date:02/13/21  Time:9:11 AM   Provider Performing:Vanshika Jastrzebski D Dewaine Conger    Procedure: Intubation ([id number])  Indication(s) Respiratory Failure  Consent Risks of the procedure as well as the alternatives and risks of each were explained to the patient and/or caregiver.  Consent for the procedure was obtained and is signed in the bedside chart   Anesthesia Etomidate, Fentanyl, and Rocuronium   Time Out Verified patient identification, verified procedure, site/side was marked, verified correct patient position, special equipment/implants available, medications/allergies/relevant history reviewed, required imaging and test results available.   Sterile Technique Usual hand hygeine, masks, and gloves were used   Procedure Description Patient positioned in bed supine.  Sedation given as noted above.  Patient was intubated with endotracheal tube using Glidescope.  View was Grade 1 full glottis .  Number of attempts was 1.  Colorimetric CO2 detector was consistent with tracheal placement.   Complications/Tolerance None; patient tolerated the procedure well. Chest X-ray is ordered to verify placement.   EBL Minimal   Specimen(s) None   Size 7.5 ETT Secured at 23 cm at lip   [name], AGACNP-BC [place] epic messenger for cross cover needs If after hours, please call E-link

## 2021-02-13 NOTE — Consult Note (Signed)
[facility] Consult Note: February 13, 2021    Date of Admission:  02/10/2021           Reason for Consult: Acute kidney injury    Referring Provider: Ottie Glazier, MD Primary Care Provider: Harrel Lemon, MD   History of Presenting Illness:  Brandi Hoover is a 81 y.o. female with multiple medical problems as listed below.  Patient brought in from [place] [facility] on January 4 via EMS with complaints of tachycardia.  Recent previous admission for pneumonia, with history of underlying COPD.  Initially upon admission, patient was noted to be in atrial fibrillation with RVR and hypotension with blood pressure in the 90s.  Patient tested positive for influenza A on January.  SARS-CoV-2 2 negative.  Nephrology consult requested for acute kidney injury. Information obtained from the chart and daughter who is in the room  Review of Systems: ROS- N/A as patient is intubated and sedated  Past Medical History:  Diagnosis Date   Asthma    Diabetes mellitus without complication (HCC)    High cholesterol    Hypertension     Social History   Tobacco Use   Smoking status: Never   Smokeless tobacco: Never  Substance Use Topics   Alcohol use: No   Drug use: No    Family History  Problem Relation Age of Onset   COPD Father    Stroke Mother      OBJECTIVE: Blood pressure (!) 97/57, pulse (!) 131, temperature 98.8 F (37.1 C), temperature source Esophageal, resp. rate (!) 22, height 5' 6"  (1.676 m), weight 112.2 kg, SpO2 99 %.  Physical Exam Physical Exam: General: Critically ill appearing  HEENT  anicteric, ETT, OGT in place  Pulm/lungs  Vent assisted  CVS/Heart  Tachycardic in 140's, regular  Abdomen:   Soft, nontender  Extremities:  No peripheral edema, soft heel supports  Neurologic:  sedated  Skin:  No acute rashes  Foley in place   Lab Results Lab Results  Component Value Date   WBC 15.2 (H) 02/13/2021   HGB 8.6 (L) 02/13/2021   HCT 28.7 (L)  02/13/2021   MCV 93.8 02/13/2021   PLT 500 (H) 02/13/2021    Lab Results  Component Value Date   CREATININE 3.05 (H) 02/13/2021   BUN 39 (H) 02/13/2021   NA 136 02/13/2021   K 4.4 02/13/2021   CL 106 02/13/2021   CO2 22 02/13/2021    Lab Results  Component Value Date   ALT 14 07/04/2019   AST 13 (L) 07/04/2019   ALKPHOS 99 07/04/2019   BILITOT 0.6 07/04/2019     Microbiology: Recent Results (from the past 240 hour(s))  Resp Panel by RT-PCR (Flu A&B, Covid) Nasopharyngeal Swab     Status: Abnormal   Collection Time: 02/09/2021 11:20 PM   Specimen: Nasopharyngeal Swab; Nasopharyngeal(NP) swabs in vial transport medium  Result Value Ref Range Status   SARS Coronavirus 2 by RT PCR NEGATIVE NEGATIVE Final    Comment: (NOTE) SARS-CoV-2 target nucleic acids are NOT DETECTED.  The SARS-CoV-2 RNA is generally detectable in upper respiratory specimens during the acute phase of infection. The lowest concentration of SARS-CoV-2 viral copies this assay can detect is 138 copies/mL. A negative result does not preclude SARS-Cov-2 infection and should not be used as the sole basis for treatment or other patient management decisions. A negative result may occur with  improper specimen collection/handling, submission of specimen other than nasopharyngeal swab, presence of viral mutation(s)  within the areas targeted by this assay, and inadequate number of viral copies(<138 copies/mL). A negative result must be combined with clinical observations, patient history, and epidemiological information. The expected result is Negative.  Fact Sheet for Patients:  EntrepreneurPulse.com.au  Fact Sheet for Healthcare Providers:  IncredibleEmployment.be  This test is no t yet approved or cleared by the [place] FDA and  has been authorized for detection and/or diagnosis of SARS-CoV-2 by FDA under an Emergency Use Authorization (EUA). This EUA will remain  in  effect (meaning this test can be used) for the duration of the COVID-19 declaration under Section 564(b)(1) of the Act, 21 U.S.C.section 360bbb-3(b)(1), unless the authorization is terminated  or revoked sooner.       Influenza A by PCR POSITIVE (A) NEGATIVE Final   Influenza B by PCR NEGATIVE NEGATIVE Final    Comment: (NOTE) The Xpert Xpress SARS-CoV-2/FLU/RSV plus assay is intended as an aid in the diagnosis of influenza from Nasopharyngeal swab specimens and should not be used as a sole basis for treatment. Nasal washings and aspirates are unacceptable for Xpert Xpress SARS-CoV-2/FLU/RSV testing.  Fact Sheet for Patients: EntrepreneurPulse.com.au  Fact Sheet for Healthcare Providers: IncredibleEmployment.be  This test is not yet approved or cleared by the [place] FDA and has been authorized for detection and/or diagnosis of SARS-CoV-2 by FDA under an Emergency Use Authorization (EUA). This EUA will remain in effect (meaning this test can be used) for the duration of the COVID-19 declaration under Section 564(b)(1) of the Act, 21 U.S.C. section 360bbb-3(b)(1), unless the authorization is terminated or revoked.  Performed at [facility], [address] [place], [place] [address]   MRSA Next Gen by PCR, Nasal     Status: None   Collection Time: 02/13/21 10:13 AM   Specimen: Nasal Mucosa; Nasal Swab  Result Value Ref Range Status   MRSA by PCR Next Gen NOT DETECTED NOT DETECTED Final    Comment: (NOTE) The GeneXpert MRSA Assay (FDA approved for NASAL specimens only), is one component of a comprehensive MRSA colonization surveillance program. It is not intended to diagnose MRSA infection nor to guide or monitor treatment for MRSA infections. Test performance is not FDA approved in patients less than 58 years old. Performed at [facility], [address] [place], [place] [address]      Medications: Scheduled Meds:  chlorhexidine gluconate (MEDLINE KIT)  15 mL Mouth Rinse BID   Chlorhexidine Gluconate Cloth  6 each Topical Daily   docusate  100 mg Per Tube BID   etomidate       fentaNYL       fentaNYL       insulin aspart  0-15 Units Subcutaneous TID WC   mouth rinse  15 mL Mouth Rinse 10 times per day   metoprolol tartrate  25 mg Oral Q6H   oseltamivir  30 mg Oral Daily   polyethylene glycol  17 g Per Tube Daily   rocuronium bromide       Continuous Infusions:  sodium chloride 250 mL (02/13/21 1310)   amiodarone 60 mg/hr (02/13/21 1328)   diltiazem (CARDIZEM) infusion Stopped (02/13/21 0856)   famotidine (PEPCID) IV     fentaNYL infusion INTRAVENOUS 25 mcg/hr (02/13/21 1328)   heparin 1,250 Units/hr (02/13/21 1328)   midazolam 2 mg/hr (02/13/21 1328)   norepinephrine (LEVOPHED) Adult infusion 6 mcg/min (02/13/21 1328)   piperacillin-tazobactam (ZOSYN)  IV     PRN Meds:.acetaminophen, fentaNYL, midazolam, ondansetron (ZOFRAN) IV  Allergies  Allergen Reactions   Mometasone Furo-Formoterol Fum Shortness Of Breath    Pt unable to breath   Azithromycin Itching   Gabapentin Other (See Comments)    Bad dreams, insomnia   Omeprazole Other (See Comments)    Reaction: unknown   Other Other (See Comments)    Made her feel worse   Ramipril Other (See Comments)    Reaction: unknown    Urinalysis: No results for input(s): COLORURINE, LABSPEC, PHURINE, GLUCOSEU, HGBUR, BILIRUBINUR, KETONESUR, PROTEINUR, UROBILINOGEN, NITRITE, LEUKOCYTESUR in the last 72 hours.  Invalid input(s): APPERANCEUR    Imaging: DG Chest 2 View  Result Date: 02/27/2021 CLINICAL DATA:  tachycardia EXAM: CHEST - 2 VIEW COMPARISON:  CXR 02/05/21 FINDINGS: The heart and mediastinal contours are unchanged. Aortic calcification. Biapical pleural/pulmonary scarring. Bibasilar streaky airspace opacity. No focal consolidation. No pulmonary edema. Bilateral trace pleural effusion. No  pneumothorax. No acute osseous abnormality. Bilateral shoulder severe degenerative changes. IMPRESSION: 1.  Bilateral trace pleural effusion. 2.  Aortic Atherosclerosis (ICD10-I70.0). Electronically Signed   By: Iven Finn M.D.   On: 02/12/2021 21:10   DG Abd 1 View  Result Date: 02/13/2021 CLINICAL DATA:  Orogastric tube placement EXAM: ABDOMEN - 1 VIEW COMPARISON:  None. FINDINGS: Orogastric tube tip is in the stomach. Bowel-gas pattern is nonobstructive. No suspicious calcifications identified. IVC filter visualized to the right of midline. Small effusions and opacities at the lung bases. Severe degenerative changes of left hip noted. IMPRESSION: Orogastric tube tip in the stomach. Electronically Signed   By: Ofilia Neas M.D.   On: 02/13/2021 09:42   DG Chest Port 1 View  Result Date: 02/13/2021 CLINICAL DATA:  Central venous catheter placement EXAM: PORTABLE CHEST 1 VIEW COMPARISON:  Previous studies including the examination done earlier today FINDINGS: Transverse diameter of heart is increased. Increased markings are seen in both lower lung fields, more so on the right side. There are no signs of alveolar pulmonary edema. There is interval placement of right IJ central venous catheter with its tip in the superior vena cava. There is no pneumothorax. There is blunting of both lateral CP angles. Tip of endotracheal tube is 4.5 cm above the carina. Enteric tube is noted traversing the esophagus. IMPRESSION: Infiltrates are seen in both lower lung fields, more so on the right side suggesting atelectasis/pneumonia. Bilateral pleural effusions are seen, more so on the right side. There is interval placement of right IJ central venous catheter with no evidence of pneumothorax. Electronically Signed   By: Elmer Picker M.D.   On: 02/13/2021 12:04   Portable Chest x-ray  Result Date: 02/13/2021 CLINICAL DATA:  Intubation. EXAM: PORTABLE CHEST 1 VIEW COMPARISON:  Chest x-ray from same day at  0818 hours. FINDINGS: New endotracheal tube tip in good position 6.3 cm above the carina. New enteric tube entering the stomach with the tip below the field of view. Stable cardiomediastinal silhouette, bibasilar airspace opacities, and small bilateral pleural effusions. No pneumothorax. No acute osseous abnormality. IMPRESSION: 1. Appropriately positioned endotracheal tube. 2. Unchanged bibasilar airspace disease and small bilateral pleural effusions. Electronically Signed   By: Titus Dubin M.D.   On: 02/13/2021 09:40   DG Chest Port 1 View  Result Date: 02/13/2021 CLINICAL DATA:  Hypertension. COPD. Respiratory failure. Recent pneumonia. EXAM: PORTABLE CHEST 1 VIEW COMPARISON:  02/10/2021 FINDINGS: Midline trachea. Mild cardiomegaly. Small bilateral pleural effusions (costophrenic angles excluded from the prior exam). No pneumothorax. Bibasilar airspace disease, greater right than left. At the left base, this is slightly  increased since 02/05/2021. New since 02/05/2021 on the right. Increased density projecting over the right apex is favored to be artifactual due to osseous summation. Advanced degenerative changes of both shoulders. IMPRESSION: Right greater than left base airspace disease, new and progressive since 02/05/2021. These areas were poorly evaluated on 03/03/2021 plain film. Favor pneumonia or aspiration. Small bilateral pleural effusions. Cardiomegaly without congestive failure. Electronically Signed   By: Abigail Miyamoto M.D.   On: 02/13/2021 08:25      Assessment/Plan:  AVENELL SELLERS is a 81 y.o. female with medical problems of hypertension, CKD stage IIIb, diabetes, COPD, anemia  was admitted on 03/10/2021 for :  Dyspnea [R06.00] Encounter for orogastric (OG) tube placement [Z46.59] Rapid atrial fibrillation ([place]) [I48.91] Atrial fibrillation with RVR ([place]) [I48.91] Endotracheal tube present [Z97.8] Acute hypoxemic respiratory failure ([place]) [J96.01]  #Acute kidney injury AKI  likely secondary to ATN from concurrent illness, hypotension and hemodynamic instability. No intake/output data recorded.  Lab Results  Component Value Date   CREATININE 3.05 (H) 02/13/2021   CREATININE 2.06 (H) 02/23/2021   CREATININE 2.21 (H) 02/05/2021    Creatinine trend is worsening. Continue supportive care Electrolytes and volume status are acceptable at present.  No acute indication for dialysis. Monitor lytes and volume status closely   #Chronic kidney disease stage IIIb.  Baseline creatinine of 1.66, GFR 31 2022. CKD risk factors include underlying diabetes, hypertension, advanced age.  #Acute hypoxemic respiratory failure Currently requiring ventilator support Management as per ICU team   #Type 2 diabetes with CKD Hemoglobin A1c 7.2% from December 18, 2020    Grae Leathers 02/13/21

## 2021-02-13 NOTE — ED Notes (Signed)
CBG 201 

## 2021-02-13 NOTE — Consult Note (Signed)
ANTICOAGULATION CONSULT NOTE - Initial Consult  Pharmacy Consult for Heparin Indication: atrial fibrillation  Allergies  Allergen Reactions   Mometasone Furo-Formoterol Fum Shortness Of Breath    Pt unable to breath   Azithromycin Itching   Gabapentin Other (See Comments)    Bad dreams, insomnia   Omeprazole Other (See Comments)    Reaction: unknown   Other Other (See Comments)    Made her feel worse   Ramipril Other (See Comments)    Reaction: unknown    Patient Measurements: Height: 5\' 6"  (167.6 cm) Weight: 108.9 kg (240 lb 1.3 oz) IBW/kg (Calculated) : 59.3 Heparin Dosing Weight: 84.6 kg  Vital Signs: BP: 151/66 (01/06 0000) Pulse Rate: 111 (01/06 0000)  Labs: Recent Labs    02/26/2021 2048 02/12/21 2133  HGB 8.0*  --   HCT 25.8*  --   PLT 430*  --   APTT  --  65*  LABPROT  --  14.7  INR  --  1.2  CREATININE 2.06*  --   TROPONINIHS 36*  --      Estimated Creatinine Clearance: 26.3 mL/min (A) (by C-G formula based on SCr of 2.06 mg/dL (H)).   Medical History: Past Medical History:  Diagnosis Date   Asthma    Diabetes mellitus without complication (HCC)    High cholesterol    Hypertension     Medications:  No chronic PTA anticoagulant use  Assessment: Pharmacy has been consulted to initiate heparin infusion in 80yo patient with new onset Afib with RVR. Patient was started on amiodarone infusion. CHADS2VASc of at least 6. Troponin levels mildly elevated. Baseline labs have been ordered and are pending.  02/12/21 2133 aPTT 65 sec (ordered as STAT baseline -- was collected 6 hours into therapy) 02/13/21 0045 HL 0.28, SUBtherapeutic   Goal of Therapy:  Heparin level 0.3-0.7 units/ml Monitor platelets by anticoagulation protocol: Yes   Plan:  Heparin subtherapeutic Give 1250 units bolus x 1 Increase heparin infusion to 1250 units/hr Check anti-Xa level in 8 hours following rate change Continue to monitor H&H and platelets  Dorothe Pea, PharmD,  BCPS Clinical Pharmacist   02/13/2021 12:44 AM

## 2021-02-13 NOTE — Consult Note (Signed)
ANTICOAGULATION CONSULT NOTE - Initial Consult  Pharmacy Consult for Heparin Indication: atrial fibrillation  Allergies  Allergen Reactions   Mometasone Furo-Formoterol Fum Shortness Of Breath    Pt unable to breath   Azithromycin Itching   Gabapentin Other (See Comments)    Bad dreams, insomnia   Omeprazole Other (See Comments)    Reaction: unknown   Other Other (See Comments)    Made her feel worse   Ramipril Other (See Comments)    Reaction: unknown    Patient Measurements: Height: 5\' 6"  (167.6 cm) Weight: 112.2 kg (247 lb 5.7 oz) IBW/kg (Calculated) : 59.3 Heparin Dosing Weight: 84.6 kg  Vital Signs: Temp: 99.1 F (37.3 C) (01/06 1115) Temp Source: Esophageal (01/06 1115) BP: 134/52 (01/06 1115) Pulse Rate: 122 (01/06 1115)  Labs: Recent Labs    02/15/2021 2048 02/12/21 2133 02/13/21 0045  HGB 8.0*  --   --   HCT 25.8*  --   --   PLT 430*  --   --   APTT  --  65*  --   LABPROT  --  14.7  --   INR  --  1.2  --   HEPARINUNFRC  --   --  0.28*  CREATININE 2.06*  --   --   TROPONINIHS 36*  --   --      Estimated Creatinine Clearance: 26.8 mL/min (A) (by C-G formula based on SCr of 2.06 mg/dL (H)).   Medical History: Past Medical History:  Diagnosis Date   Asthma    Diabetes mellitus without complication (HCC)    High cholesterol    Hypertension     Medications:  No chronic PTA anticoagulant use  Assessment: Pharmacy has been consulted to initiate heparin infusion in 81yo patient with new onset Afib with RVR. Patient was started on amiodarone infusion. CHADS2VASc of at least 6. Troponin levels mildly elevated. Baseline labs have been ordered and are pending.  Date Time aPTT/HL Rate/Comment 1/6 0045 0.28  SUBthera; 1100 >1250 un/hr **1/6  0900  --  Heparin held per Dr. Lanney Gins for intubation & CVC  1/6 1300 --  Heparin 1250 un/hr resumed     1/6 1245 <0.1  Level was drawn after gtt off >4hrs.   Baseline Labs: aPTT - drawn after dose INR -  1.2 Hgb - 8>8.6 Plts - 430>500k  Goal of Therapy:  Heparin level 0.3-0.7 units/ml Monitor platelets by anticoagulation protocol: Yes   Plan:  Heparin was held per Dr. Lanney Gins for intubation (0900), CVC placement (1130), then resumed at 1300 at previous rate of 1250 un/hr. Check anti-Xa level in 8 hours following restart Continue to monitor H&H and platelets  Lorna Dibble, PharmD, [facility] Clinical Pharmacist 02/13/2021 11:49 AM

## 2021-02-13 NOTE — Plan of Care (Signed)
Continuing with plan of care. 

## 2021-02-13 NOTE — Consult Note (Signed)
Pharmacy Antibiotic Note  Brandi Hoover is a 81 y.o. female admitted on 02/24/2021 with  shortness of breath .  Pharmacy has been consulted for Zosyn dosing for aspiration pneumonia.  Plan: Zosyn 3.375g IV q8h (4 hour infusion).  Height: 5\' 6"  (167.6 cm) Weight: 108.9 kg (240 lb 1.3 oz) IBW/kg (Calculated) : 59.3  Temp (24hrs), Avg:98.1 F (36.7 C), Min:98.1 F (36.7 C), Max:98.1 F (36.7 C)  Recent Labs  Lab 02/10/2021 2048  WBC 7.5  CREATININE 2.06*    Estimated Creatinine Clearance: 26.3 mL/min (A) (by C-G formula based on SCr of 2.06 mg/dL (H)).    Allergies  Allergen Reactions   Mometasone Furo-Formoterol Fum Shortness Of Breath    Pt unable to breath   Azithromycin Itching   Gabapentin Other (See Comments)    Bad dreams, insomnia   Omeprazole Other (See Comments)    Reaction: unknown   Other Other (See Comments)    Made her feel worse   Ramipril Other (See Comments)    Reaction: unknown    Antimicrobials this admission: Zosyn 1/6 >>     Thank you for allowing pharmacy to be a part of this patients care.  Natsha Guidry A Ezmae Speers 02/13/2021 8:54 AM

## 2021-02-13 NOTE — Progress Notes (Signed)
°  INTERVAL PROGRESS NOTE    Gasper Lloyd- 81 y.o. female  LOS: 2 __________________________________________________________________  SUBJECTIVE: Admitted 02/18/2021 with cc of SOB Chief Complaint  Patient presents with   Tachycardia   Brief overview: -Recently discharged after PNA treatment.  -Admitted yesterday with influenza A and new onset Afib RVR.  -Yesterday, patient was alert and oriented with mild hypoxia but primary dx was Afib RVR with difficult to control HR.   - Overnight, her HR improved on amiodarone and diltiazem infusions plus metoprolol and remained in Afib. She was also started on heparin gtt.  - This am, upon reviewing patient chart, received note from nurse that patient was edematous, increased WOB, and desatting to 80s on HFNC.  Martin Majestic to bedside to assess and patient had clear increased work of breathing, tachypneic in 30s, with significant rhonchi. She was minimally alert to voice. I increased her flow/FiO2 to max and she continued to desat to 70s. Called CCM to pursue intubation who responded quickly.  Family was informed at bedside. Intubation was performed successfully.  Appreciate the care of CCM team.   OBJECTIVE: Blood pressure 140/64, pulse (!) 135, temperature 99 F (37.2 C), temperature source Oral, resp. rate 20, height 5\' 6"  (1.676 m), weight 112.2 kg, SpO2 100 %.  General: asleep, difficult to arouse.  Cardiac: regular rate, cool extremities Respiratory: increased WOB, significant rhonchi, tachypneic to 30s  Abdomen: soft, edematous Extremities: non-pitting edema LE Skin: cool, no rashes noted Neuro: minimally alert to voice. Unable to follow commands. PERRL  ASSESSMENT/PLAN:  Patient currently intubated 2/2 acute respiratory failure with hypoxia in setting of influenza A infection and probable aspiration pneumonia.  - Care per CCM  Afib RVR- rate controlled and anticoagulated on heparin gtt - care per cardiology   Principal  Problem:   Atrial fibrillation with RVR, new onset ([place]) Active Problems:   COPD (chronic obstructive pulmonary disease) ([place])   Anemia   Hypertension   Stage 3b chronic kidney disease ([place])   Diabetes mellitus, type II ([place])   Influenza A   Acute hypoxemic respiratory failure ([place])   Pressure injury of skin    Richarda Osmond, DO [facility] 02/13/2021, 11:03 AM    [email or web address] Available by Epic secure chat 7AM-7PM. If 7PM-7AM, please contact night-coverage

## 2021-02-13 NOTE — Progress Notes (Signed)
Patient family took patient belongings (clothing, upper and lower dentures, and 2 rings) home.

## 2021-02-13 NOTE — Progress Notes (Addendum)
Patient had an eventful, remain unable to follow commands, B/P and (MAP) low throughout the shift, Dr. Lanney Gins notified, and numerous interventions ordered and initiated.  Central Venous Line and Arterial Line placed by NP this shift, LR bolus also administered.  Heart Rate sustained in the 140's and Dr. Lanney Gins notified and he ordered vasopressin, patient's heart continued sustained in the 140s and Dr. Lanney Gins notified - will continue to monitor patient.  Lab notified this nurse of critical lab, lactic acid 2.1, Dr. Lanney Gins notified.  Will endorse to oncoming shift.

## 2021-02-13 NOTE — Consult Note (Signed)
NAME:  Brandi Hoover, MRN:  [id number], DOB:  02/06/1941, LOS: 2 ADMISSION DATE:  02/26/2021, CONSULTATION DATE:  02/14/2020 REFERRING MD:  Dr. Ouida Sills, CHIEF COMPLAINT:  Acute Hypoxic Respiratory Failure   Brief Pt Description / Synopsis:  81 year old female admitted 02/12/21 with acute hypoxic & hypercapnic respiratory failure in the setting of influenza A infection and atrial fibrillation with RVR requiring Cardizem and amiodarone drips.  Cardiology following. On 02/13/2021 she developed acute respiratory distress and severe hypoxia along with somnolence requiring emergent intubation, suspect due to developing pneumonia versus aspiration.  History of Present Illness:  Brandi Hoover is a 81 year old female with a past medical history significant for COPD, CKD stage IIIb, hypertension, diabetes mellitus, chronic anemia who presents to [facility] ED on 02/23/2021 from her rehab facility due to complaints of shortness of breath, and tachycardia on routine vital sign check.  The patient denied chest pain, palpitations, fever, chills.  Of note she was recently hospitalized from 11/29 through 12/16 for acute hypoxic respiratory failure secondary to metapneumovirus pneumonia and superimposed bacterial pneumonia, was discharged on oxygen.  Patient currently with acute respiratory distress, therefore history is obtained from chart review.  ED course: Initial vital signs: Rapid A. fib with heart rate of 145, blood pressure 119/72, respiratory rate 20s to 30s with O2 sats 94% on 2 L Significant labs: Glucose 144, BUN 30, creatinine 2.06, high-sensitivity troponin 36, WBC 7.5, hemoglobin 8.0, hematocrit 25.8, TSH 1.9 Positive for influenza A Imaging/Diagnostics: EKG: A. fib at 141 with no acute ST-T wave changes Chest x-ray with bilateral trace pleural effusion Medications administered: IV Cardizem bolus which was transitioned to amiodarone bolus and infusion due to hypotension  She was admitted by the hospitalist for  further work-up and treatment of atrial fibrillation with RVR and acute hypoxic respiratory failure due to influenza A infection.  Cardiology was consulted.  Interval History In the morning of 02/13/2021 she developed acute respiratory distress, somnolence, and was becoming more hypoxic desaturating into the 80s on high flow nasal cannula 100%.  PCCM was asked to evaluate.  Upon PCCM arrival to bedside, given her acute respiratory distress and hypoxia decision was made to emergently intubate.  During intubation she was noted to have copious purulent secretions.  Checks x-ray with new progressive bilateral airspace disease (right greater than left) concerning for pneumonia versus aspiration.  Pertinent  Medical History  COPD Asthma Diabetes mellitus Hypertension Chronic kidney disease stage III Chronic anemia Recent hospital admission for metapneumovirus pneumonia  Micro Data:  02/27/2021: Influenza A PCR>> positive 02/28/2021: SARS-CoV-2 PCR>> negative 02/13/2021: Tracheal aspirate>> 02/13/2021: MRSA PCR>> negative 02/13/2021: Blood culture x2>> 02/13/2021: Urine>> 02/13/2021: Strep pneumo urine antigen>> 02/13/2021: Legionella urinary antigen>>  Antimicrobials:  Tamiflu 1/4>> Zosyn 1/6>>  Significant Hospital Events: Including procedures, antibiotic start and stop dates in addition to other pertinent events   02/13/2020: Admitted by the hospitalist.  Cardiology consulted for atrial fibrillation with RVR.  Requiring Cardizem and Amiodarone gtts. 02/14/2020: Acute respiratory distress and hypoxia, concern for aspiration.  PCCM consulted, emergently intubated.  Required initiation of vasopressors and central line placement  Interim History / Subjective:  -This morning patient developed acute respiratory distress and worsening hypoxia despite 100% high flow nasal cannula -Also noted to be more somnolent -PCCM asked to evaluate ~decision made to emergently intubate -Concern for developing pneumonia  versus aspiration -Critically ill  Objective   Blood pressure (!) 59/33, pulse 62, temperature 98.1 F (36.7 C), temperature source Oral, resp. rate 20, height _0  (1.676  m), weight 108.9 kg, SpO2 93 %.    FiO2 (%):  [50 %-80 %] 65 %  No intake or output data in the 24 hours ending 02/13/21 0923 Filed Weights   03/03/2021 2037  Weight: 108.9 kg    Examination: General: Critically ill-appearing female, laying in bed, on HFNC, with severe respiratory distress HENT: Atraumatic, normocephalic, neck supple, JVD Lungs: Coarse rhonchi throughout, tachypnea, increased work of breathing Cardiovascular: Tachycardia, irregular irregular rhythm Abdomen: Soft, nontender, nondistended, no guarding rebound tenderness, bowel sounds positive x4 Extremities: Normal bulk and tone, trace edema bilaterally Neuro: Somnolent, will open eyes to voice and moans, unable to follow commands, pupils PERRLA GU: Deferred  Resolved Hospital Problem list     Assessment & Plan:   Acute hypoxic & hypercapnic respiratory failure in the setting of influenza A infection with superimposed bacterial pneumonia vs aspiration PMHx: COPD, recent metapneumovirus pneumonia requiring hospitalization -Full vent support, implement lung protective strategies -Plateau pressures less than 30 cm H20 -Wean FiO2 & PEEP as tolerated to maintain O2 sats >92% -Follow intermittent Chest X-ray & ABG as needed -Spontaneous Breathing Trials when respiratory parameters met and mental status permits -Implement VAP Bundle -Bronchodilators -Continue Tamiflu -Start Zosyn  Multifactorial Shock: Septic +/- Cardiogenic Atrial Fibrillation w/ RVR Elevated Troponin, suspect demand ischemia PMHx: Hypertension -Continuous cardiac monitoring -Maintain MAP >65 -IV fluids ~ assess CVP for guidance with fluid resuscitation -Vasopressors as needed to maintain MAP goal (currently on Levophed + Vasopressin) -Trend lactic acid until  normalized -Trend HS Troponin until peaked (36 ~ ) -Check random Cortisol - Repeat Echocardiogram pending (Echo from 01/07/21 with LVEF 55%, indeterminate diastolic parameters, RV systolic function normal) -Cardiology following, appreciate input -Continue Heparin & Amiodarone gtt's as per Cardiology  Severe Sepsis due to Influenza A infection & superimposed bacterial Pneumonia vs Aspiration -Monitor fever curve -Trend WBC's & Procalcitonin -Follow cultures as above -Start empiric Zosyn pending cultures & sensitivities -Continue Tamiflu  AKI on CKD Stage III -Monitor I&O's / urinary output -Follow BMP -Ensure adequate renal perfusion -Avoid nephrotoxic agents as able -Replace electrolytes as indicated -Consult Nephrology, appreciate input  Anemia of Chronic Disease -Monitor for S/Sx of bleeding -Trend CBC -Heparin gtt for Anticoagulation/VTE Prophylaxis  -Transfuse for Hgb <7  Diabetes Mellitus -CBG's q4h; Target range of 140 to 180 -SSI -Follow ICU Hypo/Hyperglycemia protocol  Acute Metabolic Encephalopathy in setting of Hypoxia/Hypercapnia and multiple metabolic derangements Sedation needs in setting of mechanical ventilation -Maintain a RASS goal of 0 to -1 -Fentanyl and Versed drips as needed to maintain RASS goal -Avoid sedating medications as able -Daily wake up assessment     Best Practice (right click and "Reselect all SmartList Selections" daily)   Diet/type: NPO DVT prophylaxis: systemic heparin GI prophylaxis: H2B Lines: Central line, Arterial Line, and yes and it is still needed Foley:  Yes, and it is still needed Code Status:  full code Last date of multidisciplinary goals of care discussion [N/A]  Labs   CBC: Recent Labs  Lab 02/27/2021 2048  WBC 7.5  HGB 8.0*  HCT 25.8*  MCV 93.8  PLT 430*    Basic Metabolic Panel: Recent Labs  Lab 03/08/2021 2048  NA 138  K 4.1  CL 109  CO2 22  GLUCOSE 144*  BUN 30*  CREATININE 2.06*  CALCIUM 7.9*    GFR: Estimated Creatinine Clearance: 26.3 mL/min (A) (by C-G formula based on SCr of 2.06 mg/dL (H)). Recent Labs  Lab 03/04/2021 2048  WBC 7.5    Liver  Function Tests: No results for input(s): AST, ALT, ALKPHOS, BILITOT, PROT, ALBUMIN in the last 168 hours. No results for input(s): LIPASE, AMYLASE in the last 168 hours. No results for input(s): AMMONIA in the last 168 hours.  ABG    Component Value Date/Time   PHART 7.43 01/14/2021 1446   PCO2ART 48 01/14/2021 1446   PO2ART 65 (L) 01/14/2021 1446   HCO3 31.9 (H) 01/14/2021 1446   O2SAT 93.0 01/14/2021 1446     Coagulation Profile: Recent Labs  Lab 02/12/21 2133  INR 1.2    Cardiac Enzymes: No results for input(s): CKTOTAL, CKMB, CKMBINDEX, TROPONINI in the last 168 hours.  HbA1C: Hgb A1c MFr Bld  Date/Time Value Ref Range Status  01/07/2021 05:07 PM 7.1 (H) 4.8 - 5.6 % Final    Comment:    (NOTE)         Prediabetes: 5.7 - 6.4         Diabetes: >6.4         Glycemic control for adults with diabetes: <7.0   07/14/2015 04:48 AM 7.8 (H) 4.0 - 6.0 % Final    CBG: Recent Labs  Lab 02/12/21 0822 02/12/21 1144 02/12/21 1740 02/12/21 2132 02/13/21 0719  GLUCAP 133* 117* 146* 178* 201*    Review of Systems:   Unable to assess due to acute respiratory distress    Past Medical History:  She,  has a past medical history of Asthma, Diabetes mellitus without complication ([place]), High cholesterol, and Hypertension.   Surgical History:   Past Surgical History:  Procedure Laterality Date   right hip replacement       Social History:   reports that she has never smoked. She has never used smokeless tobacco. She reports that she does not drink alcohol and does not use drugs.   Family History:  Her family history includes COPD in her father; Stroke in her mother.   Allergies Allergies  Allergen Reactions   Mometasone Furo-Formoterol Fum Shortness Of Breath    Pt unable to breath   Azithromycin Itching    Gabapentin Other (See Comments)    Bad dreams, insomnia   Omeprazole Other (See Comments)    Reaction: unknown   Other Other (See Comments)    Made her feel worse   Ramipril Other (See Comments)    Reaction: unknown     Home Medications  Prior to Admission medications   Medication Sig Start Date End Date Taking? Authorizing Provider  amLODipine (NORVASC) 10 MG tablet Take 10 mg by mouth daily.   Yes [provider]  ascorbic acid (VITAMIN C) 1000 MG tablet Take 1,000 mg by mouth daily.   Yes [provider]  aspirin EC 81 MG tablet Take 81 mg by mouth daily.   Yes [provider]  benazepril (LOTENSIN) 20 MG tablet Take 20 mg by mouth 2 (two) times daily.    Yes [provider]  Fluticasone-Salmeterol (ADVAIR) 250-50 MCG/DOSE AEPB Inhale 1 puff into the lungs 2 (two) times daily.   Yes [provider]  glipiZIDE (GLUCOTROL) 10 MG tablet Take 10 mg by mouth 2 (two) times daily before a meal.   Yes [provider]  hydrochlorothiazide (HYDRODIURIL) 12.5 MG tablet Take 12.5 mg by mouth 2 (two) times daily. 11/20/20  Yes [provider]  metFORMIN (GLUCOPHAGE) 500 MG tablet Take 1,000-1,500 mg by mouth 2 (two) times daily with a meal. 1500 mg in the morning and 1000 mg in the evening   Yes  [provider]  Multiple Vitamin (MULTIVITAMIN WITH MINERALS) TABS tablet Take 1 tablet by mouth daily.   Yes [provider]  simvastatin (ZOCOR) 20 MG tablet Take 20 mg by mouth at bedtime.   Yes [provider]  vitamin B-12 (CYANOCOBALAMIN) 1000 MCG tablet Take 1,000 mcg by mouth daily.   Yes [provider]  albuterol (VENTOLIN HFA) 108 (90 Base) MCG/ACT inhaler Inhale 2 puffs into the lungs every 6 (six) hours as needed for wheezing.  07/11/15   [provider]  Calcium Carbonate-Vitamin D 600-400 MG-UNIT tablet Take 1 tablet by mouth at bedtime. Patient not taking: Reported on 03/02/2021     [provider]  docusate sodium (COLACE) 100 MG capsule Take 1 capsule (100 mg total) by mouth 2 (two) times daily. Patient not taking: Reported on 03/04/2021 07/18/15   Gladstone Lighter, MD  guaiFENesin (MUCINEX) 600 MG 12 hr tablet Take 1 tablet (600 mg total) by mouth 2 (two) times daily. Patient not taking: Reported on 03/10/2021 07/18/15   Gladstone Lighter, MD  guaiFENesin-codeine 100-10 MG/5ML syrup Take 5 mLs by mouth at bedtime as needed for cough. Patient not taking: Reported on 02/08/2021 07/18/15   Gladstone Lighter, MD  ipratropium-albuterol (DUONEB) 0.5-2.5 (3) MG/3ML SOLN Take 3 mLs by nebulization every 6 (six) hours as needed (wheezing, shortness of breath). Patient not taking: Reported on 02/27/2021 07/18/15   Gladstone Lighter, MD  phenol-menthol (CEPASTAT) 14.5 MG lozenge Place 1 lozenge inside cheek as needed for sore throat. Patient not taking: Reported on 03/05/2021 07/18/15   Gladstone Lighter, MD  polyethylene glycol [facility]) packet Take 17 g by mouth daily. HOLD for > 2 bowel movements/day Patient not taking: Reported on 02/22/2021 07/18/15   Gladstone Lighter, MD  traMADol (ULTRAM) 50 MG tablet Take 1 tablet (50 mg total) by mouth every 6 (six) hours as needed. Patient not taking: Reported on 01/07/2021 07/05/19   Carrie Mew, MD     Critical care time: 60 minutes     [name], AGACNP-BC [facility] Prefer epic messenger for cross cover needs If after hours, please call E-link

## 2021-02-13 NOTE — Progress Notes (Signed)
Goals of Care  The patient's daughter, Venida Jarvis, was called due to the patient's deteriorating health and refractory shock now requiring 4 vasopressors for BP support. Upon arrival, the Clinical status was relayed to family in detail.  Patient with Progressive multiorgan failure with very low chance of meaningful recovery despite all aggressive and optimal medical therapy. Patient is in the Dying Process associated with Suffering.  Family understands the situation. They have consented and agreed to DNR/DNI and are considering Comfort care measures.  Family are satisfied with Plan of action and management. All questions answered  Additional CC time 32 mins   Domingo Pulse [place], AGACNP-BC [facility]

## 2021-02-13 NOTE — ED Notes (Signed)
Dr. Anderson at bedside.

## 2021-02-13 NOTE — ED Notes (Signed)
Dr. Ouida Sills notified pt is sating 81%

## 2021-02-13 NOTE — Progress Notes (Signed)
Progress Note  Patient Name: Brandi Hoover Date of Encounter: 02/13/2021  Primary Cardiologist: New to [facility] - consult by [place]  Subjective   Intubated and sedated this morning. Requiring vasopressor support with Levophed and vasopressin. Remains in Afib with RVR with ventricular rates in the 130s to 140s bpm on amiodarone gtt. Has been unable to receive oral metoprolol since last night due to soft BP. Unable to tolerate diltiazem gtt due to soft BP. Received 0.25 mg of digoxin yesterday.    Inpatient Medications    Scheduled Meds:  chlorhexidine gluconate (MEDLINE KIT)  15 mL Mouth Rinse BID   Chlorhexidine Gluconate Cloth  6 each Topical Daily   docusate  100 mg Per Tube BID   insulin aspart  0-15 Units Subcutaneous TID WC   mouth rinse  15 mL Mouth Rinse 10 times per day   oseltamivir  30 mg Oral Daily   polyethylene glycol  17 g Per Tube Daily   Continuous Infusions:  sodium chloride 250 mL (02/13/21 1310)   amiodarone 60 mg/hr (02/13/21 1328)   diltiazem (CARDIZEM) infusion Stopped (02/13/21 0856)   famotidine (PEPCID) IV 20 mg (02/13/21 1344)   fentaNYL infusion INTRAVENOUS 25 mcg/hr (02/13/21 1328)   heparin 1,250 Units/hr (02/13/21 1328)   midazolam 2 mg/hr (02/13/21 1328)   norepinephrine (LEVOPHED) Adult infusion 10 mcg/min (02/13/21 1632)   piperacillin-tazobactam (ZOSYN)  IV     vasopressin 0.03 Units/min (02/13/21 1407)   PRN Meds: acetaminophen, fentaNYL, metoprolol tartrate, midazolam, ondansetron (ZOFRAN) IV   Vital Signs    Vitals:   02/13/21 1530 02/13/21 1545 02/13/21 1600 02/13/21 1615  BP: (!) 80/57 114/72  (!) 105/57  Pulse: (!) 139 76 (!) 139 (!) 137  Resp: (!) 22 (!) 22 (!) 22 (!) 24  Temp: 99.7 F (37.6 C) 100.2 F (37.9 C) 100.2 F (37.9 C) 100 F (37.8 C)  TempSrc: Esophageal Esophageal Esophageal Esophageal  SpO2: 97% 99% 99% 98%  Weight:      Height:        Intake/Output Summary (Last 24 hours) at 02/13/2021 1652 Last data filed  at 02/13/2021 1328 Gross per 24 hour  Intake 1636.71 ml  Output 750 ml  Net 886.71 ml   Filed Weights   02/18/2021 2037 02/13/21 1000  Weight: 108.9 kg 112.2 kg    Telemetry    Afib with RVR, 130s to 140s bpm - Personally Reviewed  ECG    No new tracings - Personally Reviewed  Physical Exam   GEN: Acutely ill appearing.   Neck: JVD difficult to assess secondary to body habitus and respiratory support apparatus. Cardiac: Tachycardic, IRIR, no murmurs, rubs, or gallops.  Respiratory: Diminished and vented breath sounds bilaterally.  GI: Soft, nontender, non-distended.   MS: No edema; No deformity. Neuro:  Intubated and sedated.  Psych: Intubated and sedated.  Labs    Chemistry Recent Labs  Lab 03/05/2021 2048 02/13/21 1245  NA 138 136  K 4.1 4.4  CL 109 106  CO2 22 22  GLUCOSE 144* 191*  BUN 30* 39*  CREATININE 2.06* 3.05*  CALCIUM 7.9* 7.6*  GFRNONAA 24* 15*  ANIONGAP 7 8     Hematology Recent Labs  Lab 02/23/2021 2048 02/12/21 0640 02/13/21 1245  WBC 7.5  --  15.2*  RBC 2.75* 2.91* 3.06*  HGB 8.0*  --  8.6*  HCT 25.8*  --  28.7*  MCV 93.8  --  93.8  MCH 29.1  --  28.1  MCHC 31.0  --  30.0  RDW 14.8  --  15.1  PLT 430*  --  500*    Cardiac EnzymesNo results for input(s): TROPONINI in the last 168 hours. No results for input(s): TROPIPOC in the last 168 hours.   BNPNo results for input(s): BNP, PROBNP in the last 168 hours.   DDimer No results for input(s): DDIMER in the last 168 hours.   Radiology    DG Chest 2 View  Result Date: 02/20/2021 IMPRESSION: 1.  Bilateral trace pleural effusion. 2.  Aortic Atherosclerosis (ICD10-I70.0). Electronically Signed   By: Iven Finn M.D.   On: 03/04/2021 21:10   DG Abd 1 View  Result Date: 02/13/2021 IMPRESSION: Orogastric tube tip in the stomach. Electronically Signed   By: Ofilia Neas M.D.   On: 02/13/2021 09:42   DG Chest Port 1 View  Result Date: 02/13/2021 IMPRESSION: Infiltrates are seen in  both lower lung fields, more so on the right side suggesting atelectasis/pneumonia. Bilateral pleural effusions are seen, more so on the right side. There is interval placement of right IJ central venous catheter with no evidence of pneumothorax. Electronically Signed   By: Elmer Picker M.D.   On: 02/13/2021 12:04   Portable Chest x-ray  Result Date: 02/13/2021 IMPRESSION: 1. Appropriately positioned endotracheal tube. 2. Unchanged bibasilar airspace disease and small bilateral pleural effusions. Electronically Signed   By: Titus Dubin M.D.   On: 02/13/2021 09:40   DG Chest Port 1 View  Result Date: 02/13/2021 IMPRESSION: Right greater than left base airspace disease, new and progressive since 02/05/2021. These areas were poorly evaluated on 03/05/2021 plain film. Favor pneumonia or aspiration. Small bilateral pleural effusions. Cardiomegaly without congestive failure. Electronically Signed   By: Abigail Miyamoto M.D.   On: 02/13/2021 08:25    Cardiac Studies   2D echo pending __________  2D echo 01/07/2021: 1. Left ventricular ejection fraction, by estimation, is 55%. The left  ventricle has normal function. Left ventricular endocardial border not  optimally defined to evaluate regional wall motion. Left ventricular  diastolic parameters are indeterminate.   2. Right ventricular systolic function is normal. The right ventricular  size is normal.   3. The mitral valve is normal in structure. No evidence of mitral valve  regurgitation.   4. The aortic valve was not well visualized. Aortic valve regurgitation  is not visualized.   5. The inferior vena cava is normal in size with greater than 50%  respiratory variability, suggesting right atrial pressure of 3 mmHg.  Patient Profile     81 y.o. female with history of recent admission for acute hypoxic respiratory failure secondary to viral bronchitis and bacterial PNA, CKD stage IIIb, anemia, DM, HTN, and HLD who is admitted with  influenza A with subsequent acute hypoxic respiraotory failure requiring mechanical ventilation and possible aspiration PNA and is being seen today for the evaluation of Afib with RVR.  Assessment & Plan    1. New onset Afib/flutter with RVR: -Tachycardia persists and likely likely compensatory in the setting of her severe respiratory illness -Ventricular rates remain difficult to control -May have to tolerate ventricular rates in the 110s to low 130s bpm for now until her respiratory status improves -Pharmacotherapy options are limited at this time to IV amiodarone for rate control -Hypotension has precluded continuation of oral metoprolol or Cardizem gtt -Further digoxin use is not an option with AKI -Currently hemodynamically stable -Will add IV metoprolol prn for sustained ventricular rates > 160  bpm -If ventricular rates remain difficult to control, or if she becomes unstable, may need to pursue TEE-guided DCCV, otherwise, given we do not know exactly how long she has been in Afib, would pursue rate control with plans for DCCV as an outpatient once she has been adequately anticoagulated without interruption for 4 weeks and once her acute illness is improved  -CHADS2VASc at least 6 -Heparin gtt  2. Elevated troponin: -Likely supply demand ischemia in the setting of atrial flutter with 2:1 AV block, acute on chronic hypoxic respiratory failure, influenza A, AKI on CKD, and worsening anemia -Updated echo pending per CCM, would ideally perform when rates are improved  3. Acute on chronic hypoxic and hypercapnic respiratory failure in the setting of influenza A and superimposed bacterial PNA vs aspiration PNA with septic shock: -Per CCM  4. Acute on CKD stage IIIb: -Felt to be ATN in the setting of concurrent illness and hypotension -Nephrology is following     For questions or updates, please contact [place] Please consult [email or web address] for contact info under  Cardiology/STEMI.    Signed, Christell Faith, PA-C [place] Pager: [phone] 02/13/2021, 4:52 PM

## 2021-02-13 NOTE — Progress Notes (Signed)
Initial Nutrition Assessment  DOCUMENTATION CODES:   Not applicable  INTERVENTION:   Once appropriate for tube feeds, recommend:  Vital 1.2 @55ml /hr- Initiate at 62ml/hr and q 8 hours until goal rate is reached.   Pro-Source 7ml BID via tube, provides 40kcal and 11g of protein per serving   Free water flushes 93ml q4 hours to maintain tube patency   Regimen provides 1664kcal/day, 121g/day protein and 1236ml/day free water   Juven Fruit Punch BID via tube, each serving provides 95kcal and 2.5g of protein (amino acids glutamine and arginine)  Pt at high refeed risk; recommend monitor potassium, magnesium and phosphorus labs daily until stable  NUTRITION DIAGNOSIS:   Inadequate oral intake related to inability to eat (pt sedated and ventilated) as evidenced by NPO status.  GOAL:   Provide needs based on ASPEN/SCCM guidelines  MONITOR:   Vent status, Labs, Weight trends, Skin, I & O's  REASON FOR ASSESSMENT:   Ventilator    ASSESSMENT:   81 y.o. female with medical history significant for HTN, CKD stage IIIb, DM, COPD, chronic anemia, recently hospitalized from 11/29-12/16 with acute respiratory failure secondary to metapneumovirus pneumonia and superimposed bacterial pneumonia, discharged on oxygen but weaned down from 4L  to 1L O2 currently, who presents to the ED from rehab with a complaint of shortness of breath and tachycardia.  Pt sedated and ventilated. OGT in place. No plans for tube feeds today. Will plan to initiate tube feeds once appropriate. Pt is known to nutrition department from a recent previous admit. Pt with fairly good appetite and oral intake during her last admission; pt was also drinking Ensure supplements. Per chart, pt up ~7lbs from her last admission weight.   Medications reviewed and include: colace, insulin, miralax, pepcid, heparin, levophed, zosyn   Labs reviewed: K 4.1 wnl, BUN 30(H), creat 2.06(H) Hgb 8.0(L), Hct 25.8(L) Cbgs- 176, 213,  201 x 24 hrs AIC 7.1(H)- 11/30  Patient is currently intubated on ventilator support MV: 10.9 L/min Temp (24hrs), Avg:99.2 F (37.3 C), Min:99 F (37.2 C), Max:99.5 F (37.5 C)  Propofol: none   MAP- >78mmHg  NUTRITION - FOCUSED PHYSICAL EXAM:   Flowsheet Row Most Recent Value  Orbital Region Mild depletion  Upper Arm Region No depletion  Thoracic and Lumbar Region No depletion  Buccal Region No depletion  Temple Region Moderate depletion  Clavicle Bone Region Mild depletion  Clavicle and Acromion Bone Region Mild depletion  Scapular Bone Region Moderate depletion  Dorsal Hand No depletion  Patellar Region No depletion  Anterior Thigh Region No depletion  Posterior Calf Region Moderate depletion  Edema (RD Assessment) None  Hair Reviewed  Eyes Reviewed  Mouth Reviewed  Skin Reviewed  Nails Reviewed   Diet Order:   Diet Order             Diet NPO time specified  Diet effective now                  EDUCATION NEEDS:   No education needs have been identified at this time  Skin:  Skin Assessment: Reviewed RN Assessment (DTI right heel, wounds right and left thighs, Stage III right and left IT, DTI sacrum, wounds left foot, MASD)  Last BM:  1/5- type 6  Height:   Ht Readings from Last 1 Encounters:  02/13/21 5\' 6"  (1.676 m)    Weight:   Wt Readings from Last 1 Encounters:  02/13/21 112.2 kg    Ideal Body Weight:  59 kg  BMI:  Body mass index is 39.92 kg/m.  Estimated Nutritional Needs:   Kcal:  1232-1570kcal/day  Protein:  >120g/day  Fluid:  1.5-1.8L/day  Koleen Distance MS, RD, LDN Please refer to [facility] for RD and/or RD on-call/weekend/after hours pager

## 2021-02-13 NOTE — Procedures (Signed)
Central Venous Catheter Insertion Procedure Note  Brandi Hoover  [id number]  [birth year]  Date:02/13/21  Time:11:28 AM   Provider Performing:Kang Ishida D Dewaine Conger   Procedure: Insertion of Non-tunneled Central Venous [phone]) with US guidance ([id number])   Indication(s) Medication administration and Difficult access  Consent Risks of the procedure as well as the alternatives and risks of each were explained to the patient and/or caregiver.  Consent for the procedure was obtained and is signed in the bedside chart  Anesthesia Topical only with 1% lidocaine   Timeout Verified patient identification, verified procedure, site/side was marked, verified correct patient position, special equipment/implants available, medications/allergies/relevant history reviewed, required imaging and test results available.  Sterile Technique Maximal sterile technique including full sterile barrier drape, hand hygiene, sterile gown, sterile gloves, mask, hair covering, sterile ultrasound probe cover (if used).  Procedure Description Area of catheter insertion was cleaned with chlorhexidine and draped in sterile fashion.  With real-time ultrasound guidance a central venous catheter was placed into the right internal jugular vein. Nonpulsatile blood flow and easy flushing noted in all ports.  The catheter was sutured in place and sterile dressing applied.  Complications/Tolerance None; patient tolerated the procedure well. Chest X-ray is ordered to verify placement for internal jugular or subclavian cannulation.   Chest x-ray is not ordered for femoral cannulation.  EBL Minimal  Specimen(s) None   Line secured at the 16 cm mark. BIOPATCH applied to the insertion site.   [name], AGACNP-BC [facility] Prefer epic messenger for cross cover needs If after hours, please call E-link

## 2021-02-13 NOTE — ED Notes (Signed)
This RN reached out to Dr. Ouida Sills to let her know PCT states pt. Is MUCH worse than yesterday. Her peripheral edema has increased a lot, and pt's respiratory effort is much worse.

## 2021-02-14 DIAGNOSIS — A419 Sepsis, unspecified organism: Secondary | ICD-10-CM

## 2021-02-14 DIAGNOSIS — Z9911 Dependence on respirator [ventilator] status: Secondary | ICD-10-CM

## 2021-02-14 DIAGNOSIS — R7989 Other specified abnormal findings of blood chemistry: Secondary | ICD-10-CM

## 2021-02-14 DIAGNOSIS — R6520 Severe sepsis without septic shock: Secondary | ICD-10-CM

## 2021-02-14 DIAGNOSIS — R579 Shock, unspecified: Secondary | ICD-10-CM

## 2021-02-14 DIAGNOSIS — N179 Acute kidney failure, unspecified: Secondary | ICD-10-CM

## 2021-02-14 DIAGNOSIS — R778 Other specified abnormalities of plasma proteins: Secondary | ICD-10-CM

## 2021-02-14 DIAGNOSIS — Z978 Presence of other specified devices: Secondary | ICD-10-CM | POA: Insufficient documentation

## 2021-02-14 LAB — BLOOD GAS, ARTERIAL
Acid-base deficit: 18.7 mmol/L — ABNORMAL HIGH (ref 0.0–2.0)
Bicarbonate: 13.4 mmol/L — ABNORMAL LOW (ref 20.0–28.0)
FIO2: 100
MECHVT: 500 mL
Mechanical Rate: 22
O2 Saturation: 73.5 %
PEEP: 10 cmH2O
Patient temperature: 37
pCO2 arterial: 67 mmHg (ref 32.0–48.0)
pH, Arterial: 6.91 — CL (ref 7.350–7.450)
pO2, Arterial: 66 mmHg — ABNORMAL LOW (ref 83.0–108.0)

## 2021-02-14 LAB — BLOOD CULTURE ID PANEL (REFLEXED) - BCID2

## 2021-02-14 LAB — CORTISOL: Cortisol, Plasma: 59.2 ug/dL

## 2021-02-14 MED ORDER — GLYCOPYRROLATE 0.2 MG/ML IJ SOLN: 0.2000 mg | INTRAMUSCULAR | Status: DC | PRN

## 2021-02-14 MED ORDER — POLYVINYL ALCOHOL 1.4 % OP SOLN
1.0000 [drp] | Freq: Four times a day (QID) | OPHTHALMIC | Status: DC | PRN
  Filled 2021-02-14: qty 15

## 2021-02-14 MED ORDER — MORPHINE SULFATE (PF) 2 MG/ML IV SOLN: 2.0000 mg | INTRAVENOUS | Status: DC | PRN

## 2021-02-14 MED ORDER — GLYCOPYRROLATE 1 MG PO TABS
1.0000 mg | ORAL_TABLET | ORAL | Status: DC | PRN
  Filled 2021-02-14: qty 1

## 2021-02-14 MED ORDER — DEXTROSE 5 % IV SOLN: INTRAVENOUS | Status: DC

## 2021-02-14 MED ORDER — ACETAMINOPHEN 650 MG RE SUPP: 650.0000 mg | Freq: Four times a day (QID) | RECTAL | Status: DC | PRN

## 2021-02-14 MED ORDER — ACETAMINOPHEN 325 MG PO TABS: 650.0000 mg | ORAL_TABLET | Freq: Four times a day (QID) | ORAL | Status: DC | PRN

## 2021-02-14 MED ORDER — DIPHENHYDRAMINE HCL 50 MG/ML IJ SOLN: 25.0000 mg | INTRAMUSCULAR | Status: DC | PRN

## 2021-02-15 LAB — URINE CULTURE: Culture: NO GROWTH

## 2021-02-16 LAB — CULTURE, BLOOD (ROUTINE X 2): Special Requests: ADEQUATE

## 2021-02-16 LAB — CULTURE, RESPIRATORY W GRAM STAIN

## 2021-02-18 LAB — LEGIONELLA PNEUMOPHILA SEROGP 1 UR AG: L. pneumophila Serogp 1 Ur Ag: NEGATIVE

## 2021-02-26 LAB — MISCELLANEOUS TEST

## 2021-03-01 LAB — CULTURE, BLOOD (ROUTINE X 2)

## 2021-03-11 NOTE — Death Summary Note (Signed)
DEATH SUMMARY   Patient Details  Name: Brandi Hoover MRN: [id number] DOB: [birth year]  Admission/Discharge Information   Admit Date:  02-24-21  Date of Death: Date of Death: 02-27-2021  Time of Death: Time of Death: May 12, 2022  Length of Stay: 3  Referring Physician: Harrel Lemon, MD   Reason(s) for Hospitalization  New onset atrial fibrillation with RVR and acute on chronic respiratory failure secondary to influenza A  Diagnoses  Preliminary cause of death:  Secondary Diagnoses (including complications and co-morbidities):  Principal Problem:   Atrial fibrillation with RVR, new onset ([place]) Active Problems:   COPD (chronic obstructive pulmonary disease) ([place])   Anemia   Hypertension   Diabetes mellitus, type II ([place])   Influenza A   Acute hypoxemic respiratory failure ([place])   Pressure injury of skin   Encounter for intubation   Shock ([place])   On mechanically assisted ventilation ([place])   Endotracheally intubated   Acute kidney injury superimposed on chronic kidney disease ([place])   Elevated troponin   Severe sepsis [facility])   Brief Hospital Course (including significant findings, care, treatment, and services provided and events leading to death)  Brandi Hoover is a 81 y.o. year old female who presented to [facility] ED from SNF on 02/12/2021 with acute on chronic hypoxic and hypercapnic respiratory failure in the setting of influenza A infection and atrial fibrillation with RVR requiring Cardizem and amiodarone drips.  Cardiology consulted for new onset A. Fib, initially managed with diltiazem and digoxin. On 02/13/2021 she developed acute respiratory distress and severe hypoxia along with somnolence requiring emergent intubation and mechanical ventilation due to suspected development of pneumonia versus aspiration, patient transferred to ICU.  Nephrology consulted for acute kidney injury superimposed on chronic kidney disease stage IIIb.  Atrial fibrillation RVR treated with amiodarone drip and  heparin drip due to multifactorial shock.  Patient was started on multiple vasopressors for support as well as sodium bicarbonate drip.  ABG revealed severe respiratory acidosis with a secondary metabolic acidosis. Overnight vasopressor requirements increased to maximum levels, a fourth vasopressor was added and amiodarone stopped. Daughter, Venida Jarvis, was called bedside due to the patient's serious deterioration and concern for impending cardiac arrest. Upon discussion with daughter, Venida Jarvis, the patient's CODE STATUS was changed to DNR/DNI. She continued to worsen with heart rhythm slowing and pauses becoming more and more evident. [name] decided to transition the patient to comfort measures. The patient passed immediately with family present at the bedside.  Pertinent Labs and Studies  Significant Diagnostic Studies DG Chest 2 View  Result Date: February 24, 2021 CLINICAL DATA:  tachycardia EXAM: CHEST - 2 VIEW COMPARISON:  CXR 02/05/21 FINDINGS: The heart and mediastinal contours are unchanged. Aortic calcification. Biapical pleural/pulmonary scarring. Bibasilar streaky airspace opacity. No focal consolidation. No pulmonary edema. Bilateral trace pleural effusion. No pneumothorax. No acute osseous abnormality. Bilateral shoulder severe degenerative changes. IMPRESSION: 1.  Bilateral trace pleural effusion. 2.  Aortic Atherosclerosis (ICD10-I70.0). Electronically Signed   By: Iven Finn M.D.   On: February 24, 2021 21:10   DG Chest 2 View  Result Date: 02/05/2021 CLINICAL DATA:  Irregular heart rate. EXAM: CHEST - 2 VIEW COMPARISON:  01/22/2021 FINDINGS: COPD with hyperinflation lungs. Mild bibasilar airspace disease. Improved aeration left lung base since the prior study. Decreased left effusion. Probable scarring or atelectasis. Negative for pneumonia or effusion. IMPRESSION: Bibasilar atelectasis/scarring.  Improved aeration left lung base. Electronically Signed   By: Franchot Gallo M.D.   On: 02/05/2021 18:46    DG Abd 1  View  Result Date: 02/13/2021 CLINICAL DATA:  Orogastric tube placement EXAM: ABDOMEN - 1 VIEW COMPARISON:  None. FINDINGS: Orogastric tube tip is in the stomach. Bowel-gas pattern is nonobstructive. No suspicious calcifications identified. IVC filter visualized to the right of midline. Small effusions and opacities at the lung bases. Severe degenerative changes of left hip noted. IMPRESSION: Orogastric tube tip in the stomach. Electronically Signed   By: Ofilia Neas M.D.   On: 02/13/2021 09:42   US Venous Img Lower Bilateral (DVT)  Result Date: 01/17/2021 CLINICAL DATA:  Hypoxemia EXAM: BILATERAL LOWER EXTREMITY VENOUS DOPPLER ULTRASOUND TECHNIQUE: Gray-scale sonography with graded compression, as well as color Doppler and duplex ultrasound were performed to evaluate the lower extremity deep venous systems from the level of the common femoral vein and including the common femoral, femoral, profunda femoral, popliteal and calf veins including the posterior tibial, peroneal and gastrocnemius veins when visible. The superficial great saphenous vein was also interrogated. Spectral Doppler was utilized to evaluate flow at rest and with distal augmentation maneuvers in the common femoral, femoral and popliteal veins. COMPARISON:  CT chest, 01/14/2021. FINDINGS: RIGHT LOWER EXTREMITY VENOUS Normal compressibility of the RIGHT common femoral, superficial femoral, and popliteal veins, as well as the visualized calf veins. Visualized portions of profunda femoral vein and great saphenous vein unremarkable. No filling defects to suggest DVT on grayscale or color Doppler imaging. Doppler waveforms show normal direction of venous flow, normal respiratory plasticity and response to augmentation. OTHER No evidence of superficial thrombophlebitis or abnormal fluid collection. Limitations: none LEFT LOWER EXTREMITY VENOUS Normal compressibility of the LEFT common femoral, superficial femoral, and popliteal  veins, as well as the visualized calf veins. Visualized portions of profunda femoral vein and great saphenous vein unremarkable. No filling defects to suggest DVT on grayscale or color Doppler imaging. Doppler waveforms show normal direction of venous flow, normal respiratory plasticity and response to augmentation. OTHER No evidence of superficial thrombophlebitis or abnormal fluid collection. Limitations: none IMPRESSION: No evidence of femoropopliteal DVT within either lower extremity. Michaelle Birks, MD [facility] Electronically Signed   By: Michaelle Birks M.D.   On: 01/17/2021 16:37   DG Chest Port 1 View  Result Date: 02/13/2021 CLINICAL DATA:  Central venous catheter placement EXAM: PORTABLE CHEST 1 VIEW COMPARISON:  Previous studies including the examination done earlier today FINDINGS: Transverse diameter of heart is increased. Increased markings are seen in both lower lung fields, more so on the right side. There are no signs of alveolar pulmonary edema. There is interval placement of right IJ central venous catheter with its tip in the superior vena cava. There is no pneumothorax. There is blunting of both lateral CP angles. Tip of endotracheal tube is 4.5 cm above the carina. Enteric tube is noted traversing the esophagus. IMPRESSION: Infiltrates are seen in both lower lung fields, more so on the right side suggesting atelectasis/pneumonia. Bilateral pleural effusions are seen, more so on the right side. There is interval placement of right IJ central venous catheter with no evidence of pneumothorax. Electronically Signed   By: Elmer Picker M.D.   On: 02/13/2021 12:04   Portable Chest x-ray  Result Date: 02/13/2021 CLINICAL DATA:  Intubation. EXAM: PORTABLE CHEST 1 VIEW COMPARISON:  Chest x-ray from same day at 0818 hours. FINDINGS: New endotracheal tube tip in good position 6.3 cm above the carina. New enteric tube entering the stomach  with the tip below the field of view. Stable cardiomediastinal silhouette, bibasilar  airspace opacities, and small bilateral pleural effusions. No pneumothorax. No acute osseous abnormality. IMPRESSION: 1. Appropriately positioned endotracheal tube. 2. Unchanged bibasilar airspace disease and small bilateral pleural effusions. Electronically Signed   By: Titus Dubin M.D.   On: 02/13/2021 09:40   DG Chest Port 1 View  Result Date: 02/13/2021 CLINICAL DATA:  Hypertension. COPD. Respiratory failure. Recent pneumonia. EXAM: PORTABLE CHEST 1 VIEW COMPARISON:  03/04/2021 FINDINGS: Midline trachea. Mild cardiomegaly. Small bilateral pleural effusions (costophrenic angles excluded from the prior exam). No pneumothorax. Bibasilar airspace disease, greater right than left. At the left base, this is slightly increased since 02/05/2021. New since 02/05/2021 on the right. Increased density projecting over the right apex is favored to be artifactual due to osseous summation. Advanced degenerative changes of both shoulders. IMPRESSION: Right greater than left base airspace disease, new and progressive since 02/05/2021. These areas were poorly evaluated on 03/09/2021 plain film. Favor pneumonia or aspiration. Small bilateral pleural effusions. Cardiomegaly without congestive failure. Electronically Signed   By: Abigail Miyamoto M.D.   On: 02/13/2021 08:25   DG Chest Port 1 View  Result Date: 01/22/2021 CLINICAL DATA:  Pulmonary edema EXAM: PORTABLE CHEST 1 VIEW COMPARISON:  01/21/2021 FINDINGS: Mild improvement in right lower lobe atelectasis or infiltrate. Minimal right effusion No change in left lower lobe airspace disease and small left effusion Pulmonary vascularity normal.  Negative for edema Advanced degenerative change in both shoulders IMPRESSION: Improvement in right lower lobe airspace disease No change left lower lobe airspace disease and left effusion. Possible pneumonia. Electronically Signed   By: Franchot Gallo M.D.   On: 01/22/2021 15:44   DG Chest Port 1 View  Result Date: 01/21/2021 CLINICAL DATA:  Shortness of breath EXAM: PORTABLE CHEST 1 VIEW COMPARISON:  01/14/2021 FINDINGS: Transverse diameter of heart is increased. There are no signs of pulmonary edema. There are patchy infiltrates in both lower lung fields with interval worsening. Upper lung fields are essentially clear. There is blunting of lateral CP angles. There is no pneumothorax. IMPRESSION: Cardiomegaly. There are linear densities in the lower lung fields suggesting subsegmental atelectasis/pneumonitis with interval worsening. Small bilateral pleural effusions. Electronically Signed   By: Elmer Picker M.D.   On: 01/21/2021 13:14    Microbiology Recent Results (from the past 240 hour(s))  Resp Panel by RT-PCR (Flu A&B, Covid) Nasopharyngeal Swab     Status: Abnormal   Collection Time: 02/18/2021 11:20 PM   Specimen: Nasopharyngeal Swab; Nasopharyngeal(NP) swabs in vial transport medium  Result Value Ref Range Status   SARS Coronavirus 2 by RT PCR NEGATIVE NEGATIVE Final    Comment: (NOTE) SARS-CoV-2 target nucleic acids are NOT DETECTED.  The SARS-CoV-2 RNA is generally detectable in upper respiratory specimens during the acute phase of infection. The lowest concentration of SARS-CoV-2 viral copies this assay can detect is 138 copies/mL. A negative result does not preclude SARS-Cov-2 infection and should not be used as the sole basis for treatment or other patient management decisions. A negative result may occur with  improper specimen collection/handling, submission of specimen other than nasopharyngeal swab, presence of viral mutation(s) within the areas targeted by this assay, and inadequate number of viral copies(<138 copies/mL). A negative result must be combined with clinical observations, patient history, and epidemiological information. The expected result is Negative.  Fact Sheet for Patients:   EntrepreneurPulse.com.au  Fact Sheet for Healthcare Providers:  IncredibleEmployment.be  This test is no t yet approved or cleared by the [place] FDA and  has been  authorized for detection and/or diagnosis of SARS-CoV-2 by FDA under an Emergency Use Authorization (EUA). This EUA will remain  in effect (meaning this test can be used) for the duration of the COVID-19 declaration under Section 564(b)(1) of the Act, 21 U.S.C.section 360bbb-3(b)(1), unless the authorization is terminated  or revoked sooner.       Influenza A by PCR POSITIVE (A) NEGATIVE Final   Influenza B by PCR NEGATIVE NEGATIVE Final    Comment: (NOTE) The Xpert Xpress SARS-CoV-2/FLU/RSV plus assay is intended as an aid in the diagnosis of influenza from Nasopharyngeal swab specimens and should not be used as a sole basis for treatment. Nasal washings and aspirates are unacceptable for Xpert Xpress SARS-CoV-2/FLU/RSV testing.  Fact Sheet for Patients: EntrepreneurPulse.com.au  Fact Sheet for Healthcare Providers: IncredibleEmployment.be  This test is not yet approved or cleared by the [place] FDA and has been authorized for detection and/or diagnosis of SARS-CoV-2 by FDA under an Emergency Use Authorization (EUA). This EUA will remain in effect (meaning this test can be used) for the duration of the COVID-19 declaration under Section 564(b)(1) of the Act, 21 U.S.C. section 360bbb-3(b)(1), unless the authorization is terminated or revoked.  Performed at [facility], [address] [place], [place] [address]   MRSA Next Gen by PCR, Nasal     Status: None   Collection Time: 02/13/21 10:13 AM   Specimen: Nasal Mucosa; Nasal Swab  Result Value Ref Range Status   MRSA by PCR Next Gen NOT DETECTED NOT DETECTED Final    Comment: (NOTE) The GeneXpert MRSA Assay (FDA approved for NASAL specimens only), is one component  of a comprehensive MRSA colonization surveillance program. It is not intended to diagnose MRSA infection nor to guide or monitor treatment for MRSA infections. Test performance is not FDA approved in patients less than 48 years old. Performed at [facility], [address] [place], [place] [address]     Lab Basic Metabolic Panel: Recent Labs  Lab 02/20/2021 2048 02/13/21 1245 02/13/21 2151  NA 138 136  --   K 4.1 4.4  --   CL 109 106  --   CO2 22 22  --   GLUCOSE 144* 191*  --   BUN 30* 39*  --   CREATININE 2.06* 3.05*  --   CALCIUM 7.9* 7.6*  --   MG  --   --  1.7  PHOS  --   --  9.4*   Liver Function Tests: No results for input(s): AST, ALT, ALKPHOS, BILITOT, PROT, ALBUMIN in the last 168 hours. No results for input(s): LIPASE, AMYLASE in the last 168 hours. No results for input(s): AMMONIA in the last 168 hours. CBC: Recent Labs  Lab 02/20/2021 2048 02/13/21 1245  WBC 7.5 15.2*  HGB 8.0* 8.6*  HCT 25.8* 28.7*  MCV 93.8 93.8  PLT 430* 500*   Cardiac Enzymes: No results for input(s): CKTOTAL, CKMB, CKMBINDEX, TROPONINI in the last 168 hours. Sepsis Labs: Recent Labs  Lab 02/26/2021 2048 02/13/21 1245 02/13/21 1639 02/13/21 2151  PROCALCITON  --  19.60  --   --   WBC 7.5 15.2*  --   --   LATICACIDVEN  --   --  2.1* 5.6*    Procedures/Operations  02/13/21: Endotracheal intubation 02/13/21: CVC line placement 02/13/21: Arterial Line Placement   Jawann Urbani L Rust-Chester 2021/03/02, 1:54 AM  Domingo Pulse [place], AGACNP-BC Acute Care Nurse Practitioner [facility]   [phone] / [phone] Please  see Amion for pager details.

## 2021-03-11 NOTE — Progress Notes (Signed)
Goals of Care  Spoke again with the patient's daughter, [name] bedside. The patient's vitals continue to worsen despite aggressive support.  Patient remains hypotensive and her heart rate is slowing with pauses noted in her rhythm despite the amiodarone being stopped.  We discussed that cardiac arrest is imminent. They have consented and agreed to proceed with Comfort care measures so she will not be on the ventilator while she passes.   Domingo Pulse [place], AGACNP-BC [facility]    Please see Amion for pager details.

## 2021-03-11 NOTE — Progress Notes (Signed)
As night progressed, unable to maintain blood pressure on multiple pressors, notified NP and family at approximately 2300, family arrived at hospital and spoke with NP, it was then decided to make patient a DNR, but keep doing everything else possible. Then as family sat with patient and saw that she was not responding to medications, they decided to make her a comfort care patient. At [place] ET tube was pulled by NP, and IV's were turned off. Time of death was May 17, 2022

## 2021-03-11 NOTE — Progress Notes (Signed)
°   02-21-2021 0025  Clinical Encounter Type  Visited With Health care provider  Visit Type Death  Spiritual Encounters  Spiritual Needs Other (Comment) (did not wish for chaplain)   Daryel November responded to ICU desk who informed of Pt death. Family did not wish for pastoral care support at this time. Daryel November actually spoke with Pt's daughter this afternoon to offer support and this provided what support was needed. Chaplain B remains available throughout this night if supportive presence is needed.

## 2021-03-11 DEATH — deceased

## 2022-04-30 IMAGING — DX DG CHEST 1V PORT
1 series · 2 of 2 positions shown · non-contrast
Comparison: Previous studies including the examination done earlier
today

CLINICAL DATA: Central venous catheter placement

EXAM:
PORTABLE CHEST 1 VIEW

[Series 1: chest ap · 0.14mm/px · 2 of 2 slices shown]
[im 1/2]
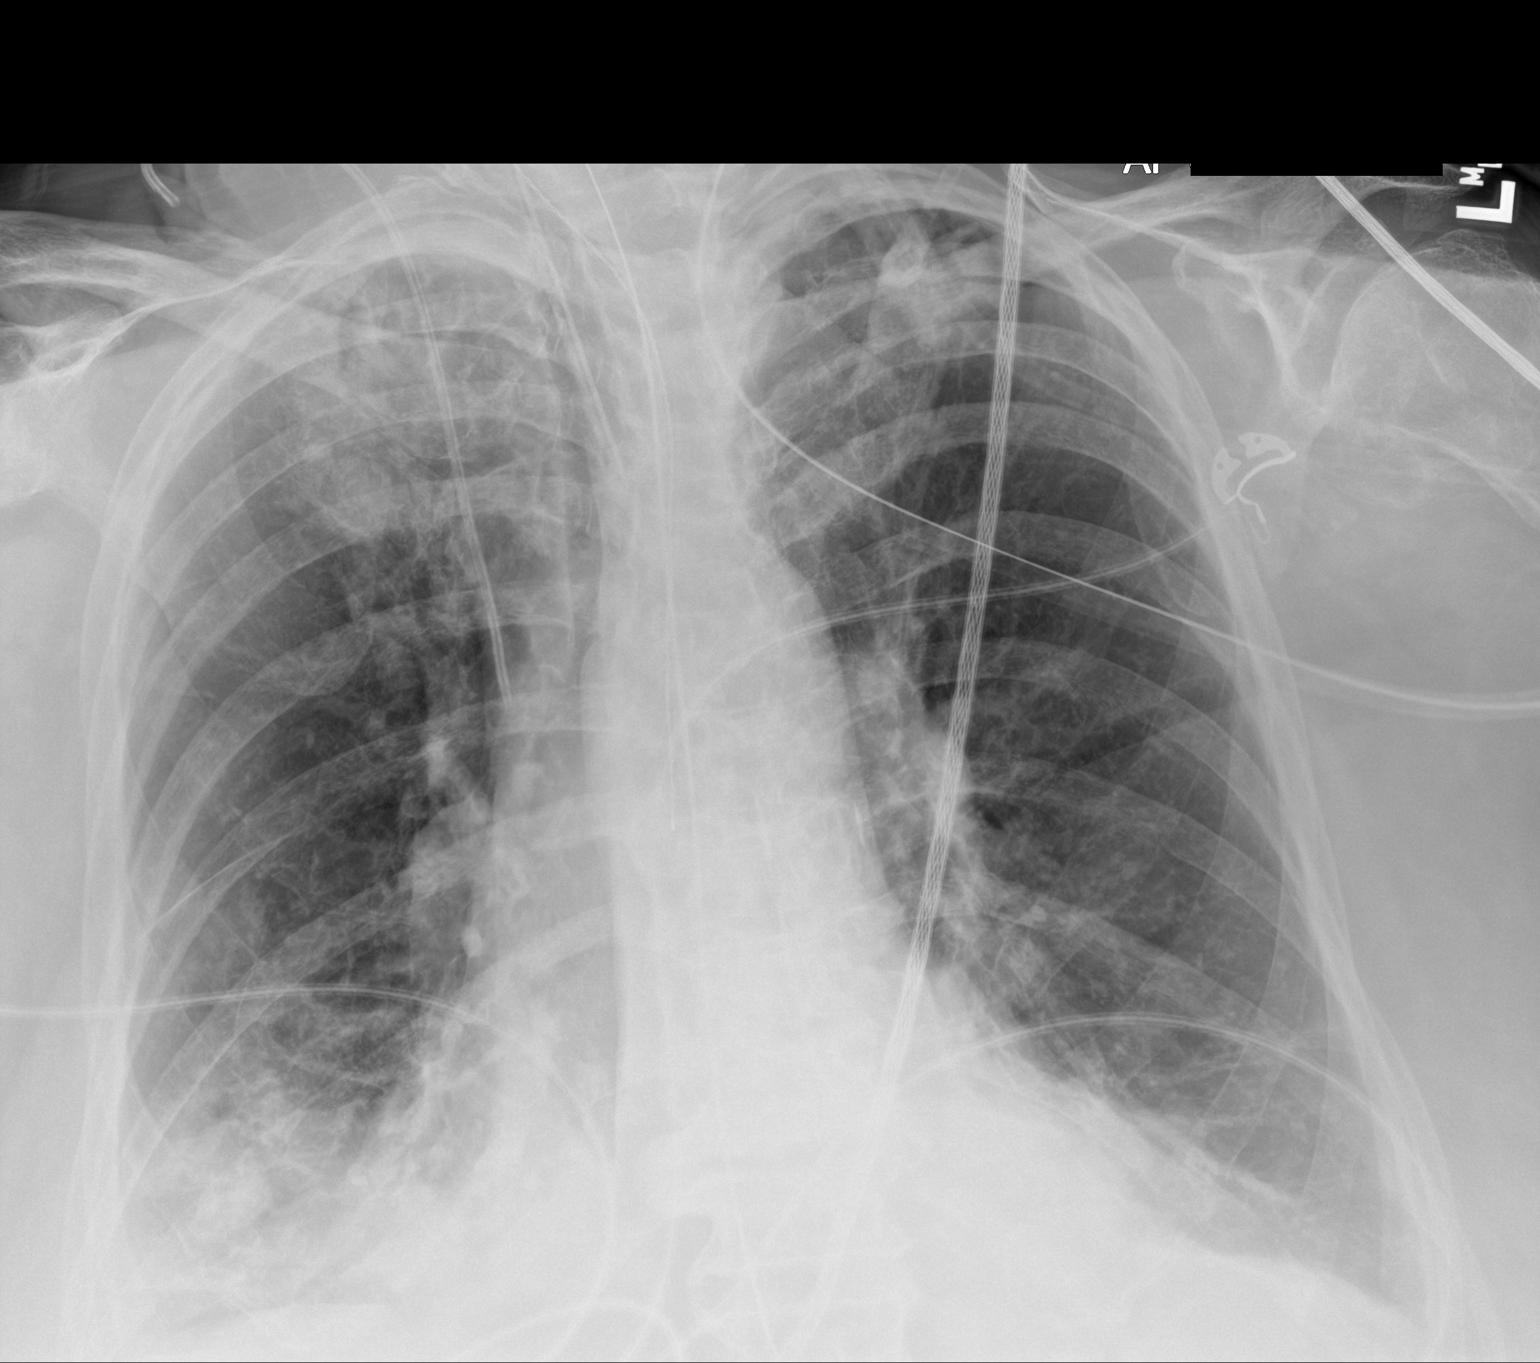
[im 2/2]
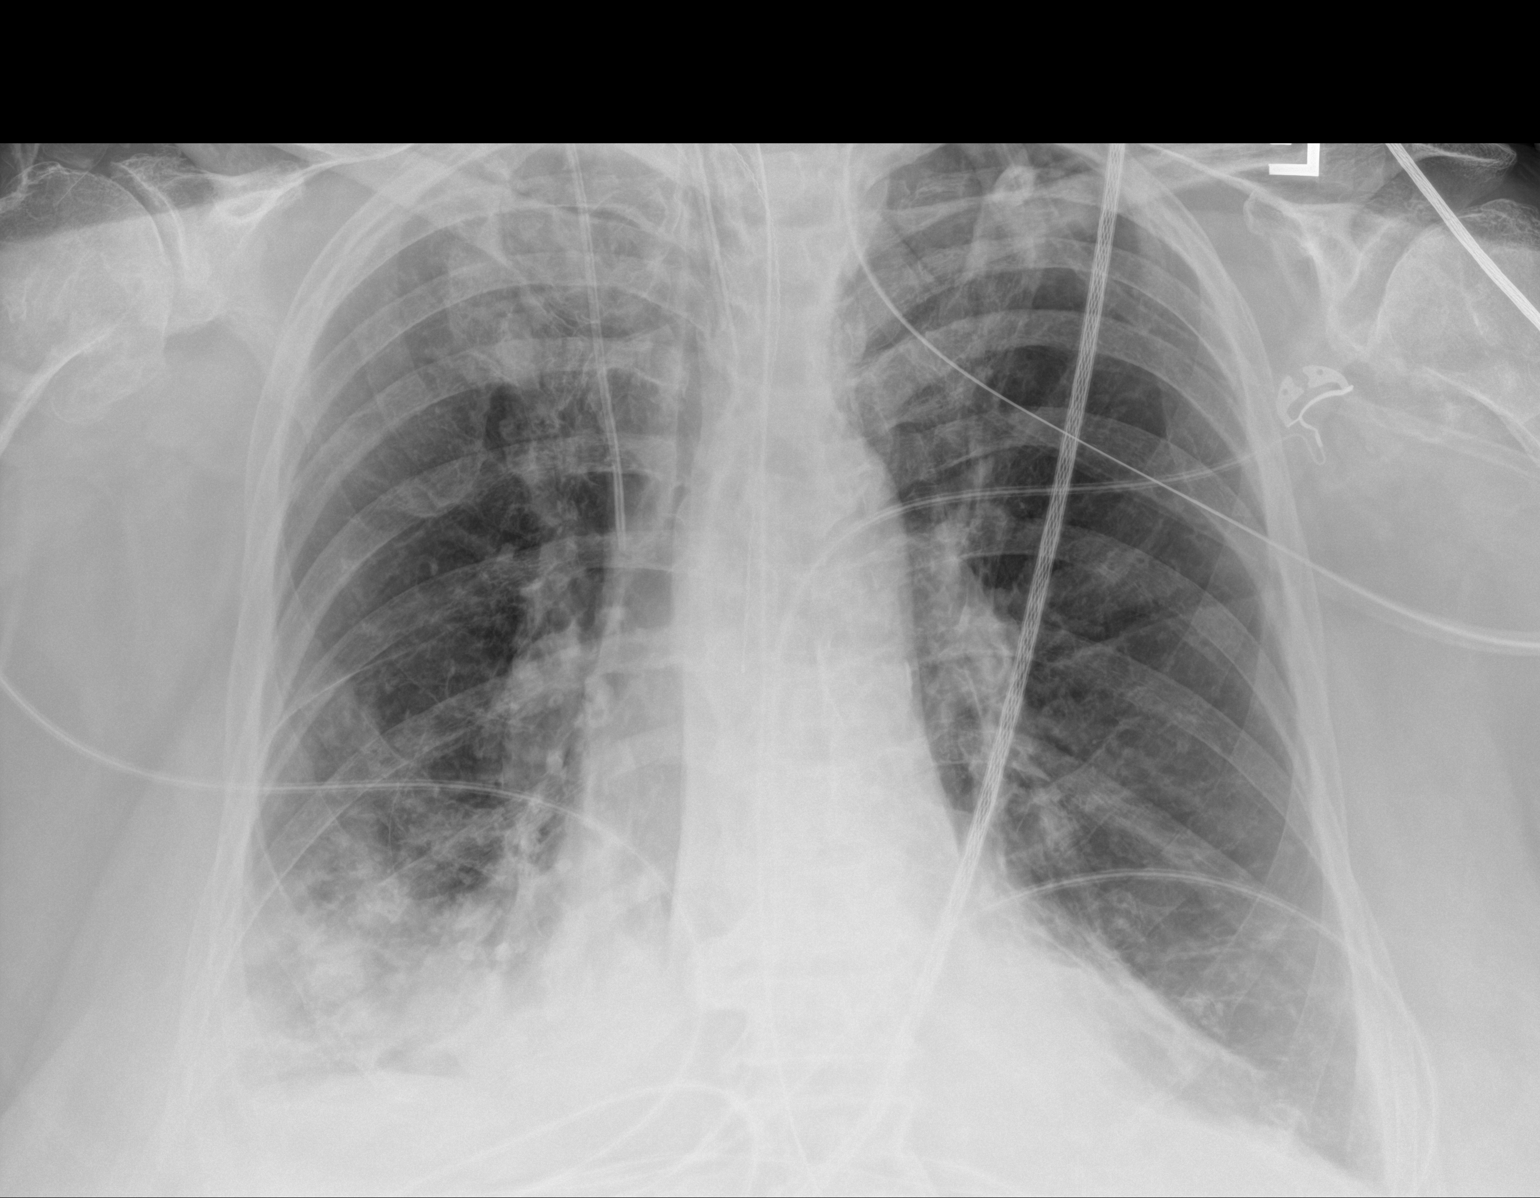

[2 of 2 positions shown; findings below may reference images not displayed]

FINDINGS: Transverse diameter of heart is increased. Increased markings are
seen in both lower lung fields, more so on the right side. There are
no signs of alveolar pulmonary edema. There is interval placement of
right IJ central venous catheter with its tip in the superior vena
cava. There is no pneumothorax. There is blunting of both lateral CP
angles. Tip of endotracheal tube is 4.5 cm above the carina. Enteric
tube is noted traversing the esophagus.
IMPRESSION: Infiltrates are seen in both lower lung fields, more so on the right
side suggesting atelectasis/pneumonia. Bilateral pleural effusions
are seen, more so on the right side. There is interval placement of
right IJ central venous catheter with no evidence of pneumothorax.

## 2022-04-30 IMAGING — DX DG CHEST 1V PORT
1 series · 1 of 1 positions shown · non-contrast
Comparison: 02/11/2021

CLINICAL DATA: Hypertension. COPD. Respiratory failure. Recent
pneumonia.

EXAM:
PORTABLE CHEST 1 VIEW

[chest ap]
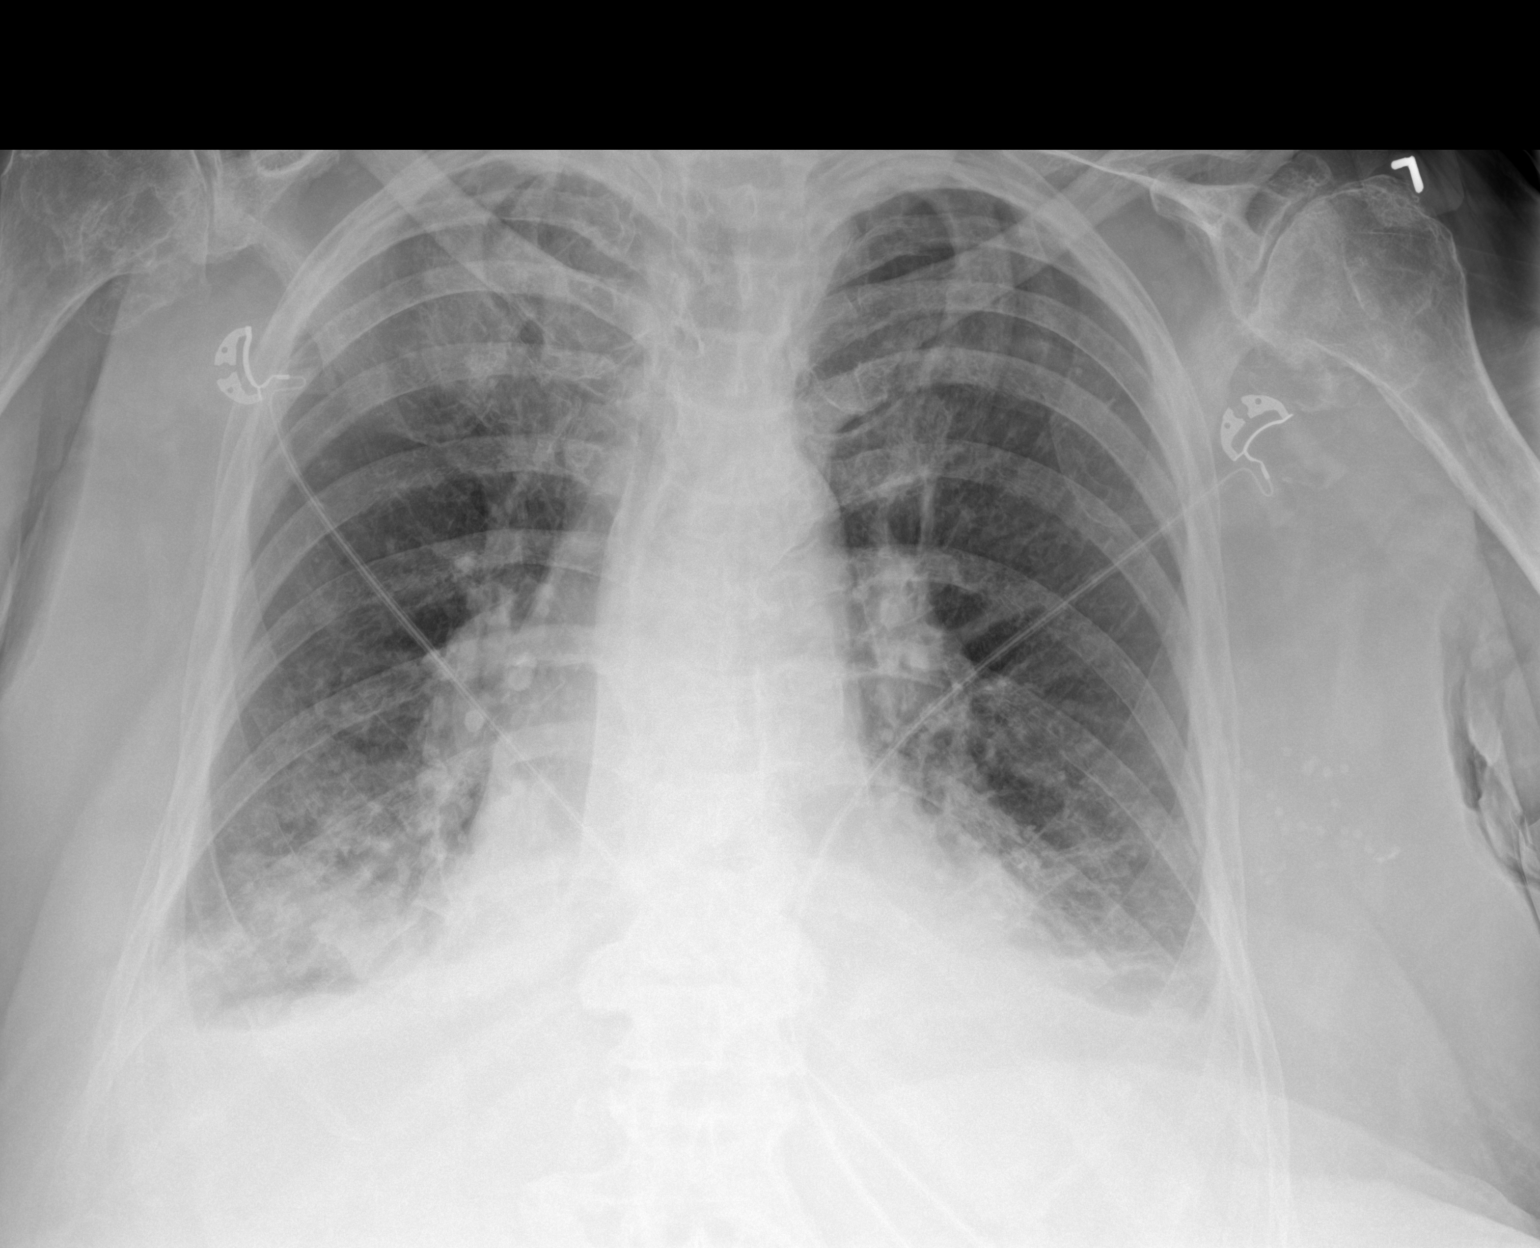

[1 of 1 positions shown; findings below may reference images not displayed]

FINDINGS: Midline trachea. Mild cardiomegaly. Small bilateral pleural
effusions (costophrenic angles excluded from the prior exam). No
pneumothorax. Bibasilar airspace disease, greater right than left.
At the left base, this is slightly increased since 02/05/2021. New
since 02/05/2021 on the right. Increased density projecting over the
right apex is favored to be artifactual due to osseous summation.

Advanced degenerative changes of both shoulders.
IMPRESSION: Right greater than left base airspace disease, new and progressive
since 02/05/2021. These areas were poorly evaluated on 02/11/2021
plain film. Favor pneumonia or aspiration.

Small bilateral pleural effusions.

Cardiomegaly without congestive failure.
